# Patient Record
Sex: Male | Born: 1985 | Race: White | Hispanic: No | Marital: Married
Health system: Southern US, Academic
[De-identification: ages and names within clinical notes are randomized; demographics above are authoritative.]

## PROBLEM LIST (undated history)

## (undated) ENCOUNTER — Ambulatory Visit

## (undated) ENCOUNTER — Encounter

## (undated) ENCOUNTER — Encounter
Attending: Student in an Organized Health Care Education/Training Program | Primary: Student in an Organized Health Care Education/Training Program

## (undated) ENCOUNTER — Telehealth

## (undated) ENCOUNTER — Encounter: Attending: Dermatology | Primary: Dermatology

## (undated) ENCOUNTER — Ambulatory Visit: Payer: MEDICAID | Attending: Dermatology | Primary: Dermatology

## (undated) DIAGNOSIS — J45909 Unspecified asthma, uncomplicated: Secondary | ICD-10-CM

## (undated) DIAGNOSIS — G43909 Migraine, unspecified, not intractable, without status migrainosus: Secondary | ICD-10-CM

## (undated) DIAGNOSIS — L732 Hidradenitis suppurativa: Secondary | ICD-10-CM

## (undated) DIAGNOSIS — E785 Hyperlipidemia, unspecified: Secondary | ICD-10-CM

## (undated) DIAGNOSIS — R569 Unspecified convulsions: Secondary | ICD-10-CM

## (undated) DIAGNOSIS — R55 Syncope and collapse: Secondary | ICD-10-CM

## (undated) HISTORY — DX: Migraine, unspecified, not intractable, without status migrainosus: G43.909

## (undated) HISTORY — PX: TONSILLECTOMY: SUR1361

## (undated) HISTORY — DX: Unspecified convulsions: R56.9

## (undated) HISTORY — DX: Hyperlipidemia, unspecified: E78.5

## (undated) HISTORY — PX: KIDNEY SURGERY: SHX687

## (undated) HISTORY — PX: TYMPANOSTOMY TUBE PLACEMENT: SHX32

## (undated) HISTORY — DX: Hidradenitis suppurativa: L73.2

## (undated) HISTORY — PX: HERNIA REPAIR: SHX51

## (undated) HISTORY — DX: Syncope and collapse: R55

---

## 2007-01-07 HISTORY — PX: NEPHRECTOMY LIVING DONOR: SUR877

## 2017-07-10 ENCOUNTER — Encounter: Payer: Self-pay | Admitting: Gynecology

## 2017-07-10 ENCOUNTER — Other Ambulatory Visit: Payer: Self-pay

## 2017-07-10 ENCOUNTER — Ambulatory Visit
Admission: EM | Admit: 2017-07-10 | Discharge: 2017-07-10 | Disposition: A | Discharge status: Home / Self Care | Payer: Self-pay | Attending: Family Medicine | Admitting: Family Medicine

## 2017-07-10 DIAGNOSIS — L0291 Cutaneous abscess, unspecified: Secondary | ICD-10-CM

## 2017-07-10 DIAGNOSIS — H6001 Abscess of right external ear: Secondary | ICD-10-CM

## 2017-07-10 HISTORY — DX: Unspecified asthma, uncomplicated: J45.909

## 2017-07-10 MED ORDER — LIDOCAINE HCL (PF) 1 % IJ SOLN: 5.0000 mL | Freq: Once | INTRAMUSCULAR | Status: DC

## 2017-07-10 MED ORDER — SULFAMETHOXAZOLE-TRIMETHOPRIM 800-160 MG PO TABS: 1.0000 | ORAL_TABLET | Freq: Two times a day (BID) | ORAL | 0 refills | Status: AC

## 2017-07-10 NOTE — ED Provider Notes (Signed)
[facility] ____________________________________________  Time seen: Approximately 5:47 PM  I have reviewed the triage vital signs and the nursing notes.   HISTORY  Chief Complaint No chief complaint on file.   HPI Mario Boyd is a 32 y.o. male presenting for evaluation of skin changes noted beneath right ear.  Patient reports that for at least several weeks he has had a very small bump like area beneath right earlobe, and states that time is nontender and unchanged.  Reports over the last few weeks the area has became red and increased in size as well as tenderness.  Denies any drainage.  States that they had leftover doxycycline at home in which he completed a 7-day course of 100 mg twice daily.  States that the redness has improved but the area is still swollen without drainage.  States that it does cause some pressure to his right ear, but denies other pain.  States pain is worse with direct palpation.  Denies hearing changes, fevers, throat swelling, posterior pain, cough, congestion, previous skin infections, history of MRSA.  No other alleviating measures attempted.  Denies any other aggravating factors reports otherwise feels well.  Reports up-to-date on immunizations including tetanus.  Denies insect bite.   Past Medical History:  Diagnosis Date  . Asthma     There are no active problems to display for this patient.   Past Surgical History:  Procedure Laterality Date  . HERNIA REPAIR    . KIDNEY SURGERY    . TONSILLECTOMY    . TYMPANOSTOMY TUBE PLACEMENT        Current Facility-Administered Medications:  .  lidocaine (PF) (XYLOCAINE) 1 % injection 5 mL, 5 mL, Other, Once, Marylene Land, NP  Current Outpatient Medications:  .  sulfamethoxazole-trimethoprim (BACTRIM DS,SEPTRA DS) 800-160 MG tablet, Take 1 tablet by mouth 2 (two) times daily for 10 days., Disp: 20 tablet, Rfl: 0  Allergies Patient has no known allergies.  History reviewed. No  pertinent family history.  Social History Social History   Tobacco Use  . Smoking status: Current Every Day Smoker    Packs/day: 1.00    Types: Cigarettes  . Smokeless tobacco: Never Used  Substance Use Topics  . Alcohol use: Never    Frequency: Never  . Drug use: Never    Review of Systems Constitutional: No fever/chills Cardiovascular: Denies chest pain. Respiratory: Denies shortness of breath. Gastrointestinal: No abdominal pain.  Musculoskeletal: Negative for back pain. Skin: As above.   ____________________________________________   PHYSICAL EXAM:  VITAL SIGNS: ED Triage Vitals  Enc Vitals Group     BP 07/10/17 1628 (!) 141/101     Pulse Rate 07/10/17 1628 90     Resp 07/10/17 1628 16     Temp 07/10/17 1628 98.5 F (36.9 C)     Temp Source 07/10/17 1628 Oral     SpO2 07/10/17 1628 100 %     Weight 07/10/17 1630 204 lb (92.5 kg)     Height 07/10/17 1630 5\' 9"  (1.753 m)     Head Circumference --      Peak Flow --      Pain Score 07/10/17 1630 3     Pain Loc --      Pain Edu? --      Excl. in [place]? --     Constitutional: Alert and oriented. Well appearing and in no acute distress. Eyes: Conjunctivae are normal.  ENT      Head: Normocephalic and atraumatic.  Ears: Left: Nontender, normal canal, no erythema, normal TM.  Right: Nontender, normal canal, no erythema, normal TM.  No mastoid tenderness bilaterally.  See skin below.      Nose: No congestion/rhinnorhea.      Mouth/Throat: Mucous membranes are moist.Oropharynx non-erythematous. Neck: No stridor. Supple without meningismus.  Hematological/Lymphatic/Immunilogical: No cervical lymphadenopathy. Cardiovascular: Normal rate, regular rhythm. Grossly normal heart sounds.  Good peripheral circulation. Respiratory: Normal respiratory effort without tachypnea nor retractions. Breath sounds are clear and equal bilaterally. No wheezes, rales, rhonchi. Musculoskeletal:  No midline cervical, thoracic or lumbar  tenderness to palpation. Neurologic:  Normal speech and language. Speech is normal. No gait instability.  Skin:  Skin is warm, dry.  Except: Just inferior to right earlobe superficial minimally erythematous approximately 1.5 cm fluctuance, non-pointing, no drainage, no surrounding tenderness, mild tenderness to direct palpation.  Psychiatric: Mood and affect are normal. Speech and behavior are normal. Patient exhibits appropriate insight and judgment   ___________________________________________   LABS (all labs ordered are listed, but only abnormal results are displayed)  Labs Reviewed - No data to display   PROCEDURES Procedures    Procedure(s) performed:  Procedure(s) performed:  Procedure explained and verbal consent obtained. Consent: Verbal consent obtained. Written consent not obtained. Risks and benefits: risks, benefits and alternatives were discussed Patient identity confirmed: verbally with patient and hospital-assigned identification number  Consent given by: patient   I&D abscess Location: right inferior ear Preparation: Patient was prepped and draped in the usual sterile fashion. Anesthesia with 1% Lidocaine 3 mls Irrigation solution: saline and betadine Amount of cleaning: copious  Superficial incision made with #11 blade scalpel Moderate purulent drainage immediately obtained with expression. Sterile forceps used to probe and break up loculations.  1/4 " iodoform gauze used and packed.  Patient tolerate well. Wound well approximated post repair.  dressing applied.  Wound care instructions provided.  Observe for any signs of infection or other problems.      INITIAL IMPRESSION / ASSESSMENT AND PLAN / ED COURSE  Pertinent labs & imaging results that were available during my care of the patient were reviewed by me and considered in my medical decision making (see chart for details).  Well-appearing patient.  Right inferior earlobe superficial abscess noted,  which was fluctuant, drained, patient tolerated well and reports improved discomfort immediately after.  Will place patient on oral Bactrim.  Recommend 2 to 3-day wound check and packing removal.  Discussed keeping clean, warm compresses and supportive care.  Discussed strict follow-up and return parameters.Discussed indication, risks and benefits of medications with patient.  Discussed follow up with Primary care physician this week. Discussed follow up and return parameters including no resolution or any worsening concerns. Patient verbalized understanding and agreed to plan.   ____________________________________________   FINAL CLINICAL IMPRESSION(S) / ED DIAGNOSES  Final diagnoses:  Cutaneous abscess, unspecified site     ED Discharge Orders        Ordered    sulfamethoxazole-trimethoprim (BACTRIM DS,SEPTRA DS) 800-160 MG tablet  2 times daily     07/10/17 1705       Note: This dictation was prepared with Dragon dictation along with smaller phrase technology. Any transcriptional errors that result from this process are unintentional.         Marylene Land, NP 07/10/17 1827

## 2017-07-10 NOTE — ED Triage Notes (Signed)
Per patient cyst at right ear.

## 2017-07-10 NOTE — Discharge Instructions (Addendum)
Take medication as prescribed. Warm compresses. Keep clean.   Return to urgent care in 2 to 3 days for packing removal and wound recheck.  Follow up with your primary care physician this week as needed. Return to Urgent care for new or worsening concerns.

## 2018-01-31 ENCOUNTER — Encounter: Payer: Self-pay | Admitting: Internal Medicine

## 2018-01-31 ENCOUNTER — Emergency Department
Admission: EM | Admit: 2018-01-31 | Discharge: 2018-01-31 | Disposition: A | Discharge status: Home / Self Care | Payer: Medicaid Other | Attending: Emergency Medicine | Admitting: Emergency Medicine

## 2018-01-31 ENCOUNTER — Other Ambulatory Visit: Payer: Self-pay

## 2018-01-31 DIAGNOSIS — J45909 Unspecified asthma, uncomplicated: Secondary | ICD-10-CM | POA: Insufficient documentation

## 2018-01-31 DIAGNOSIS — F17210 Nicotine dependence, cigarettes, uncomplicated: Secondary | ICD-10-CM | POA: Insufficient documentation

## 2018-01-31 DIAGNOSIS — L02811 Cutaneous abscess of head [any part, except face]: Secondary | ICD-10-CM

## 2018-01-31 DIAGNOSIS — R220 Localized swelling, mass and lump, head: Secondary | ICD-10-CM | POA: Diagnosis present

## 2018-01-31 DIAGNOSIS — L03811 Cellulitis of head [any part, except face]: Secondary | ICD-10-CM | POA: Diagnosis not present

## 2018-01-31 MED ORDER — SULFAMETHOXAZOLE-TRIMETHOPRIM 800-160 MG PO TABS: 1.0000 | ORAL_TABLET | Freq: Two times a day (BID) | ORAL | 0 refills | Status: DC

## 2018-01-31 NOTE — ED Provider Notes (Signed)
[facility] Emergency Department Provider Note ____________________________________________  Time seen: 19  I have reviewed the triage vital signs and the nursing notes.  HISTORY  Chief Complaint  Rash   HPI Mario Boyd is a 33 y.o. male presents to the clinic today with c/o multiple abscess on his scalp. He reports this has been going on for years. He reports multiple areas that will intermittently drain pus and blood. He reports the areas are very sensitive to touch, but otherwise, not painful. He denies fever, chills or body aches. He reports his sister has Hidradenitis Suppurative and he reports he thinks he may have the same although it has not been diagnosed. He has not taken anything OTC for this. He has no insurance and has not seen a dermatologist.  Past Medical History:  Diagnosis Date  . Asthma     There are no active problems to display for this patient.   Past Surgical History:  Procedure Laterality Date  . HERNIA REPAIR    . KIDNEY SURGERY    . TONSILLECTOMY    . TYMPANOSTOMY TUBE PLACEMENT      Prior to Admission medications   Medication Sig Start Date End Date Taking? Authorizing Provider  sulfamethoxazole-trimethoprim (BACTRIM DS,SEPTRA DS) 800-160 MG tablet Take 1 tablet by mouth 2 (two) times daily. 01/31/18   Jearld Fenton, NP    Allergies Patient has no known allergies.  History reviewed. No pertinent family history.  Social History Social History   Tobacco Use  . Smoking status: Current Every Day Smoker    Packs/day: 1.00    Types: Cigarettes  . Smokeless tobacco: Never Used  Substance Use Topics  . Alcohol use: Never    Frequency: Never  . Drug use: Never    Review of Systems  Constitutional: Negative for fever, chills or body aches. Cardiovascular: Negative for chest pain. Respiratory: Negative for shortness of breath. Skin: Positive for multiple abscess of scalp.  Neurological: Negative for headaches,  focal weakness or numbness. ____________________________________________  PHYSICAL EXAM:  VITAL SIGNS: ED Triage Vitals  Enc Vitals Group     BP 01/31/18 1929 (!) 144/88     Pulse Rate 01/31/18 1929 78     Resp 01/31/18 1929 16     Temp 01/31/18 1929 98.9 F (37.2 C)     Temp Source 01/31/18 1929 Oral     SpO2 01/31/18 1929 97 %     Weight 01/31/18 1930 210 lb (95.3 kg)     Height 01/31/18 1930 5\' 9"  (1.753 m)     Head Circumference --      Peak Flow --      Pain Score 01/31/18 1932 5     Pain Loc --      Pain Edu? --      Excl. in [place]? --     Constitutional: Alert and oriented. Well appearing and in no distress. Cardiovascular: Normal rate, regular rhythm.  Respiratory: Normal respiratory effort. No wheezes/rales/rhonchi. Neurologic:  Normal speech and language. No gross focal neurologic deficits are appreciated. Skin:  Multiple (10 +) small abscess noted of scalp. None of them are open or draining. 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

## 2018-01-31 NOTE — ED Triage Notes (Signed)
Patient reports rash/sores on his head and other body parts.  States they have been there for "awhile" and thinks its HS but hasn't been to a dermatologist.

## 2018-01-31 NOTE — Discharge Instructions (Signed)
You have been diagnosed with multiple abscess of the scalp. There is nothing that can be drained at this time. I am giving you a RX for Septra DS to take 2 x day for 14 days. Follow up with dermatology as an outpatient.

## 2018-04-21 ENCOUNTER — Telehealth: Payer: Self-pay | Admitting: Nurse Practitioner

## 2018-04-21 ENCOUNTER — Ambulatory Visit: Payer: Self-pay | Admitting: *Deleted

## 2018-04-21 DIAGNOSIS — J111 Influenza due to unidentified influenza virus with other respiratory manifestations: Secondary | ICD-10-CM

## 2018-04-21 NOTE — Progress Notes (Signed)

## 2018-04-21 NOTE — Telephone Encounter (Signed)
   Reason for Disposition . MILD difficulty breathing (e.g., minimal/no SOB at rest, SOB with walking, pulse <100)  Answer Assessment - Initial Assessment Questions 1. COVID-19 DIAGNOSIS: "Who made your Coronavirus (COVID-19) diagnosis?" "Was it confirmed by a positive lab test?" If not diagnosed by a HCP, ask "Are there lots of cases (community spread) where you live?" (See public health department website, if unsure)   * MAJOR community spread: high number of cases; numbers of cases are increasing; many people hospitalized.   * MINOR community spread: low number of cases; not increasing; few or no people hospitalized     Community spread- no known contact 2. ONSET: "When did the COVID-19 symptoms start?"      yesterday 3. WORST SYMPTOM: "What is your worst symptom?" (e.g., cough, fever, shortness of breath, muscle aches)     Throat pain 4. COUGH: "How bad is the cough?"       Dry cough- comes and goes 5. FEVER: "Do you have a fever?" If so, ask: "What is your temperature, how was it measured, and when did it start?"     Did have fever 101.3- now 99.6- ear thermometer 6. RESPIRATORY STATUS: "Describe your breathing?" (e.g., shortness of breath, wheezing, unable to speak)      Chest tightness- constant- worse with exertion 7. BETTER-SAME-WORSE: "Are you getting better, staying the same or getting worse compared to yesterday?"  If getting worse, ask, "In what way?"     Getting worse- whole body is hurting, tightness in chest, fever is worse 8. HIGH RISK DISEASE: "Do you have any chronic medical problems?" (e.g., asthma, heart or lung disease, weak immune system, etc.)     HS- cyst, childhood asthma 9. PREGNANCY: "Is there any chance you are pregnant?" "When was your last menstrual period?"     n/a 10. OTHER SYMPTOMS: "Do you have any other symptoms?"  (e.g., runny nose, headache, sore throat, loss of smell)       Headache, sore throat  Protocols used: CORONAVIRUS (COVID-19) DIAGNOSED OR  SUSPECTED-A-AH

## 2018-04-26 ENCOUNTER — Ambulatory Visit
Admission: EM | Admit: 2018-04-26 | Discharge: 2018-04-26 | Disposition: A | Discharge status: Home / Self Care | Payer: Self-pay | Attending: Family Medicine | Admitting: Family Medicine

## 2018-04-26 ENCOUNTER — Other Ambulatory Visit: Payer: Self-pay

## 2018-04-26 DIAGNOSIS — B9689 Other specified bacterial agents as the cause of diseases classified elsewhere: Secondary | ICD-10-CM

## 2018-04-26 DIAGNOSIS — L02811 Cutaneous abscess of head [any part, except face]: Secondary | ICD-10-CM

## 2018-04-26 MED ORDER — DOXYCYCLINE HYCLATE 100 MG PO CAPS: 100.0000 mg | ORAL_CAPSULE | Freq: Two times a day (BID) | ORAL | 0 refills | Status: DC

## 2018-04-26 MED ORDER — PREDNISONE 10 MG PO TABS: ORAL_TABLET | ORAL | 0 refills | Status: DC

## 2018-04-26 NOTE — Discharge Instructions (Addendum)
Take medication as prescribed. Rest. Drink plenty of fluids. Clean scalp with chlorhexidine-Chlorhexidine can be used for showering, beginning with once per week use and increasing up to once daily as tolerated.   Please follow up with specialty dermatology as discussed.  See [facility] information given.  Follow up with your primary care physician this week as needed. Return to Urgent care for new or worsening concerns.

## 2018-04-26 NOTE — ED Provider Notes (Signed)
[facility] ____________________________________________  Time seen: Approximately 2:34 PM  I have reviewed the triage vital signs and the nursing notes.   HISTORY  Chief Complaint Cyst   HPI Mario Boyd is a 33 y.o. male presenting for evaluation of multiple abscess areas to his scalp.  Patient reports this is been an ongoing issue.  States for the last several months these areas have been present and intermittently flareup more so than other times.  Was last on Bactrim in January, which he states it started to help some but never fully resolved.  States using topical antibiotic ointments have not helped.  Denies other aggravating alleviating factors.  States the areas are tender and intermittently drain.  Sister with history of hidradenitis suppurativa.  Denies any contact changes.  Denies fevers, cough, congestion, recent sickness or other recent antibiotic use.  Reports otherwise doing well. Denies other skin changes elsewhere.     Past Medical History:  Diagnosis Date  . Asthma     There are no active problems to display for this patient.   Past Surgical History:  Procedure Laterality Date  . HERNIA REPAIR    . KIDNEY SURGERY    . TONSILLECTOMY    . TYMPANOSTOMY TUBE PLACEMENT       No current facility-administered medications for this encounter.   Current Outpatient Medications:  .  FLUoxetine (PROZAC) 40 MG capsule, Take 40 mg by mouth daily., Disp: , Rfl:  .  doxycycline (VIBRAMYCIN) 100 MG capsule, Take 1 capsule (100 mg total) by mouth 2 (two) times daily., Disp: 20 capsule, Rfl: 0 .  predniSONE (DELTASONE) 10 MG tablet, Start 60 mg po day one, then 50 mg po day two, taper by 10 mg daily until complete., Disp: 21 tablet, Rfl: 0  Allergies Patient has no known allergies.  History reviewed. No pertinent family history.  Social History Social History   Tobacco Use  . Smoking status: Current Every Day Smoker    Packs/day: 0.50    Types:  Cigarettes  . Smokeless tobacco: Never Used  Substance Use Topics  . Alcohol use: Never    Frequency: Never  . Drug use: Never    Review of Systems Constitutional: No fever Cardiovascular: Denies chest pain. Respiratory: Denies shortness of breath. Gastrointestinal: No abdominal pain.  Musculoskeletal: Negative for back pain. Skin: As above.   ____________________________________________   PHYSICAL EXAM:  VITAL SIGNS: ED Triage Vitals  Enc Vitals Group     BP 04/26/18 1351 (!) 146/99     Pulse Rate 04/26/18 1351 85     Resp 04/26/18 1351 16     Temp 04/26/18 1351 98.8 F (37.1 C)     Temp Source 04/26/18 1351 Oral     SpO2 04/26/18 1351 100 %     Weight 04/26/18 1352 200 lb (90.7 kg)     Height 04/26/18 1352 5\' 9"  (1.753 m)     Head Circumference --      Peak Flow --      Pain Score 04/26/18 1351 7     Pain Loc --      Pain Edu? --      Excl. in [place]? --     Constitutional: Alert and oriented. Well appearing and in no acute distress. ENT      Head: Normocephalic. Hematological/Lymphatic/Immunilogical: No cervical lymphadenopathy. Cardiovascular: Normal rate, regular rhythm. Grossly normal heart sounds.  Good peripheral circulation. Respiratory: Normal respiratory effort without tachypnea nor retractions. Breath sounds are clear and equal bilaterally.  No wheezes, rales, rhonchi. Musculoskeletal: Steady gait.  Neurologic:  Normal speech and language. Speech is normal. No gait instability.  Skin:  Skin is warm, dry. Except: Numerous small abscesses noted on the scalp throughout varying from approximately 1.5 cm to smaller, some crusting, no active drainage, mild scattered erythema. psychiatric: Mood and affect are normal. Speech and behavior are normal. Patient exhibits appropriate insight and judgment   ___________________________________________   LABS (all labs ordered are listed, but only abnormal results are displayed)  Labs Reviewed - No data to display    PROCEDURES Procedures   INITIAL IMPRESSION / [place] / ED COURSE  Pertinent labs & imaging results that were available during my care of the patient were reviewed by me and considered in my medical decision making (see chart for details).  Well-appearing patient.  No acute distress.  Patient with recurrent episodes of numerous abscesses to scalp, present with the same, concern for hidradenitis suppurativa.  Will treat with doxycycline and prednisone.  Recommend chlorhexidine cleansing.  Also further recommend follow-up with dermatology for specially clinics. Discussed indication, risks and benefits of medications with patient.  Discussed follow up with Primary care physician this week. Discussed follow up and return parameters including no resolution or any worsening concerns. Patient verbalized understanding and agreed to plan.   ____________________________________________   FINAL CLINICAL IMPRESSION(S) / ED DIAGNOSES  Final diagnoses:  Scalp abscess     ED Discharge Orders         Ordered    doxycycline (VIBRAMYCIN) 100 MG capsule  2 times daily     04/26/18 1419    predniSONE (DELTASONE) 10 MG tablet     04/26/18 1419           Note: This dictation was prepared with Dragon dictation along with smaller phrase technology. Any transcriptional errors that result from this process are unintentional.         Marylene Land, NP 04/26/18 1442

## 2018-04-26 NOTE — ED Triage Notes (Signed)
Patient states that he has multiple cysts all over his head, causing him to lose his hair.

## 2018-07-22 ENCOUNTER — Other Ambulatory Visit: Payer: Self-pay

## 2018-07-22 ENCOUNTER — Ambulatory Visit
Admission: EM | Admit: 2018-07-22 | Discharge: 2018-07-22 | Disposition: A | Discharge status: Home / Self Care | Payer: Self-pay | Attending: Urgent Care | Admitting: Urgent Care

## 2018-07-22 ENCOUNTER — Encounter: Payer: Self-pay | Admitting: Emergency Medicine

## 2018-07-22 DIAGNOSIS — L732 Hidradenitis suppurativa: Secondary | ICD-10-CM

## 2018-07-22 MED ORDER — DOXYCYCLINE HYCLATE 100 MG PO CAPS: 100.0000 mg | ORAL_CAPSULE | Freq: Two times a day (BID) | ORAL | 0 refills | Status: AC

## 2018-07-22 MED ORDER — PREDNISONE 10 MG (21) PO TBPK: ORAL_TABLET | Freq: Every day | ORAL | 0 refills | Status: DC

## 2018-07-22 NOTE — ED Triage Notes (Signed)
Pt c/o abscess of his scalp. He states that it has been going for over a year but recently got worse about 2 months ago. He states this is due to HS.

## 2018-07-22 NOTE — Discharge Instructions (Addendum)
It was very nice seeing you today in clinic. Thank you for entrusting me with your care.   Please utilize the medications that we discussed. Your prescriptions have been called in to your pharmacy. Continue to use chlorhexidine   Make arrangements to follow up with your regular doctor in 1 week for re-evaluation if not improving. If your symptoms/condition worsens, please seek follow up care either here or in the ER. Please remember, our [place] providers are "right here with you" when you need [place].   Again, it was my pleasure to take care of you today. Thank you for choosing our clinic. I hope that you start to feel better quickly.   Honor Loh, MSN, APRN, FNP-C, CEN Advanced Practice Provider [facility]

## 2018-07-22 NOTE — ED Provider Notes (Addendum)
[place], [place]   Name: Mario Boyd DOB: [birth year] MRN: [id number] CSN: [id number] PCP: Patient, No Pcp Per  Arrival date and time:  07/22/18 1334  Chief Complaint:  Abscess (scalp)   NOTE: Prior to seeing the patient today, I have reviewed the triage nursing documentation and vital signs. Clinical staff has updated patient's PMH/PSHx, current medication list, and drug allergies/intolerances to ensure comprehensive history available to assist in medical decision making.   History:   HPI: Mario Boyd is a 33 y.o. male who presents today with complaints of a recurrence of his familial hidradenitis suppurativa. Patient has significant disease to his scalp Charlestine Massed stage III). Sister has the same condition, however her lesions are mainly in her axillae and groin. Sister being followed by specialist at [facility] for her condition. Patient has been advised of need to see dermatology for more definitive treatment of his condition, however due to financial concerns, he has been unable to be seen to date. Patient does note that he recently learned that the same clinic that his sister is being treated in at [facility] has a patient financial assistance program. He states, "I found out they take payments. I am in the process of trying to get in to be seen".   In review of available medical records, it appears as if patient is being seen about every 3 months for flares of his chronic skin condition. He notes that he was last treated with a course of doxycycline and prednisone (04/26/2018), which "almost cleared him up". However, he advises that once the medication courses ended his symptoms returned. He has also been treated with SMZ-TMP in the past, however feels as if the doxycycline is more effective. Patient states, "I deal with it until I can't anymore before I come in".   Patient presents with diffusely distributed (multiple) abcesses to scalp. Areas are painful and have been draining sanguinopurulent exudate.  There is evidence of sinus tracking and significant scarring to his entire scalp that has resulted in patchy hair growth. Patient denies any associated systemic symptoms of infection; no fevers, chills, or myalgias. He continues to use the recommended chlorhexidine gluconate (CHG) solution on the areas every other day. Over the course of the last few days, lesions have declared on the posterior aspect of his neck, within his facial hair, and on his anterior chest wall. He has used topical antibiotic ointments in the past, those of which have proven to be ineffective in improving his symptoms during flares. Patient cites the descending spread of his lesions, and overall frustration, as reasons for being seen today. He states, "this is ridiculous. I can't even shave my head and this looks so bad".   Past Medical History:  Diagnosis Date  . Asthma     Past Surgical History:  Procedure Laterality Date  . HERNIA REPAIR    . KIDNEY SURGERY    . TONSILLECTOMY    . TYMPANOSTOMY TUBE PLACEMENT      History reviewed. No pertinent family history.  Social History   Tobacco Use  . Smoking status: Former Smoker    Packs/day: 0.50    Types: Cigarettes  . Smokeless tobacco: Never Used  Substance Use Topics  . Alcohol use: Never    Frequency: Never  . Drug use: Never    There are no active problems to display for this patient.   Home Medications:    No outpatient medications have been marked as taking for the 07/22/18 encounter [facility] Encounter).    Allergies:  Patient has no known allergies.  Review of Systems (ROS): Review of Systems  Constitutional: Negative for chills and fever.  Respiratory: Negative for cough and shortness of breath.   Cardiovascular: Negative for chest pain and palpitations.  Skin: Positive for color change and wound.  Hematological: Negative for adenopathy.     Vital Signs: Today's Vitals   07/22/18 1347 07/22/18 1350  BP:  (!) 132/99  Pulse:  85   Resp:  18  Temp:  98.1 F (36.7 C)  TempSrc:  Oral  SpO2:  100%  Weight: 200 lb (90.7 kg)   Height: 5\' 9"  (1.753 m)   PainSc: 5      Physical Exam: Physical Exam  Constitutional: He is oriented to person, place, and time and well-developed, well-nourished, and in no distress.  HENT:  Head: Normocephalic and atraumatic. Hair is abnormal (patchy 2/2 chronic skin condition).  Mouth/Throat: Oropharynx is clear and moist and mucous membranes are normal.  Eyes: Pupils are equal, round, and reactive to light. EOM are normal.  Neck: Normal range of motion. Neck supple. No tracheal deviation present.  Cardiovascular: Normal rate, regular rhythm, normal heart sounds and intact distal pulses. Exam reveals no gallop and no friction rub.  No murmur heard. Pulmonary/Chest: Effort normal and breath sounds normal. No respiratory distress. He has no wheezes. He has no rales.  Lymphadenopathy:    He has no cervical adenopathy.  Neurological: He is alert and oriented to person, place, and time. Gait normal. GCS score is 15.  Skin: Skin is warm and dry. Lesion noted. No rash noted. There is erythema.  Multiple skin abscesses noted to scalp, posterior neck, within facial hair, and to anterior chest wall. Significant sinus tracking to scalp; areas draining sanguinopurulent material. (+) significant scarring resulting in patchy hair growth.   Psychiatric: Mood, memory, affect and judgment normal.  Nursing note and vitals reviewed.   Urgent Care Treatments / Results:   LABS: PLEASE NOTE: all labs that were ordered this encounter are listed, however only abnormal results are displayed. Labs Reviewed - No data to display  EKG: -None  RADIOLOGY: No results found.  PROCEDURES: Procedures  MEDICATIONS RECEIVED THIS VISIT: Medications - No data to display  PERTINENT CLINICAL COURSE NOTES/UPDATES:   Initial Impression / Assessment and Plan / Urgent Care Course:  Pertinent labs & imaging results  that were available during my care of the patient were personally reviewed by me and considered in my medical decision making (see lab/imaging section of note for values and interpretations).  Sabin Gibeault is a 33 y.o. male who presents to [facility] today with complaints of hidradenitis suppurativa flare.   Patient is well appearing overall in clinic today. He does not appear to be in any acute distress. Presenting symptoms (see HPI) and exam as documented above. Patient with significant Hurley stage III disease. He suffers flares about every 2-3 months. Patient last treated back on 04/26/2018, however he notes that the antibiotics did not abate the flare. He is asking for antibiotic course to be extended this time . He continues to use the recommended CHG every other day.  He has had limited access to care due to financial concerns, however he notes plans are to be seen in speciality dermatology clinic at [facility] soon. Will treat current disease flare with 14 day course of doxycycline, in addition to a 6 day prednisone taper. May use Tylenol and/or Ibuprofen as needed for pain. Warm compresses may help promote drainage of scalp  abscesses. Patient to use TAO to neck and chest lesions to prevent infection and spread. He is encouraged to return a call to the clinic for continued evidence of acute flare with the completion of the prescribed interventions.   Discussed follow up with primary care physician in 1 week for re-evaluation. I have reviewed the follow up and strict return precautions for any new or worsening symptoms. Patient is aware of symptoms that would be deemed urgent/emergent, and would thus require further evaluation either here or in the emergency department. At the time of discharge, he verbalized understanding and consent with the discharge plan as it was reviewed with him. All questions were fielded by provider and/or clinic staff prior to patient discharge.    Final Clinical  Impressions / Urgent Care Diagnoses:   Final diagnoses:  Familial hidradenitis suppurativa type 3    New Prescriptions:  [place] Controlled Substance Registry consulted? Not Applicable  Meds ordered this encounter  Medications  . doxycycline (VIBRAMYCIN) 100 MG capsule    Sig: Take 1 capsule (100 mg total) by mouth 2 (two) times daily for 14 days.    Dispense:  28 capsule    Refill:  0  . predniSONE (STERAPRED UNI-PAK 21 TAB) 10 MG (21) TBPK tablet    Sig: Take by mouth daily. Take by mouth: 60 mg x 1 day, 50 mg x 1 day, 40 mg x 1 day, 30 mg x 1 day, 20 mg x 1 day, 10 mg x 1 day    Dispense:  21 tablet    Refill:  0    Recommended Follow up Care:  Patient encouraged to follow up with the following provider within the specified time frame, or sooner as dictated by the severity of his symptoms. As always, he was instructed that for any urgent/emergent care needs, he should seek care either here or in the emergency department for more immediate evaluation.  Follow-up Information    PCP In 1 week.   Why: General reassessment of symptoms if not improving        NOTE: This note was prepared using Lobbyist along with smaller Company secretary. Despite my best ability to proofread, there is the potential that transcriptional errors may still occur from this process, and are completely unintentional.     Karen Kitchens, NP 07/22/18 1715

## 2018-08-28 DIAGNOSIS — R509 Fever, unspecified: Principal | ICD-10-CM

## 2018-08-28 DIAGNOSIS — Z20828 Contact with and (suspected) exposure to other viral communicable diseases: Secondary | ICD-10-CM | POA: Diagnosis not present

## 2018-08-28 DIAGNOSIS — R61 Generalized hyperhidrosis: Secondary | ICD-10-CM | POA: Diagnosis not present

## 2018-08-28 DIAGNOSIS — L732 Hidradenitis suppurativa: Secondary | ICD-10-CM | POA: Diagnosis not present

## 2018-08-29 ENCOUNTER — Ambulatory Visit: Admit: 2018-08-29 | Discharge: 2018-08-29 | Disposition: A | Discharge status: Home / Self Care | Payer: MEDICAID

## 2018-08-29 ENCOUNTER — Emergency Department: Admit: 2018-08-29 | Discharge: 2018-08-29 | Disposition: A | Discharge status: Home / Self Care | Payer: MEDICAID

## 2018-09-03 ENCOUNTER — Ambulatory Visit: Admit: 2018-09-03 | Discharge: 2018-09-04 | Payer: MEDICAID | Attending: Dermatology | Primary: Dermatology

## 2018-09-03 DIAGNOSIS — L700 Acne vulgaris: Secondary | ICD-10-CM

## 2018-09-03 DIAGNOSIS — L663 Perifolliculitis capitis abscedens: Principal | ICD-10-CM

## 2018-09-03 DIAGNOSIS — Z79899 Other long term (current) drug therapy: Secondary | ICD-10-CM

## 2018-09-03 MED ORDER — ISOTRETINOIN 40 MG CAPSULE
ORAL_CAPSULE | Freq: Every day | ORAL | 0 refills | 30.00000 days | Status: CP
Start: 2018-09-03 — End: 2018-09-03

## 2018-09-03 MED ORDER — PREDNISONE 10 MG TABLET
ORAL_TABLET | ORAL | 0 refills | 0.00000 days | Status: CP
Start: 2018-09-03 — End: ?

## 2018-09-03 MED ORDER — ISOTRETINOIN 40 MG CAPSULE: 40 mg | capsule | Freq: Every day | 0 refills | 30 days | Status: AC

## 2018-09-06 DIAGNOSIS — Z79899 Other long term (current) drug therapy: Secondary | ICD-10-CM | POA: Diagnosis not present

## 2018-09-06 DIAGNOSIS — L663 Perifolliculitis capitis abscedens: Secondary | ICD-10-CM | POA: Diagnosis not present

## 2018-09-06 LAB — CBC AND DIFFERENTIAL
HCT: 44 (ref 41–53)
Hemoglobin: 15 (ref 13.5–17.5)
Platelets: 602 — AB (ref 150–399)
WBC: 13.8

## 2018-09-06 LAB — BASIC METABOLIC PANEL
BUN: 13 (ref 4–21)
Creatinine: 1 (ref 0.6–1.3)
Glucose: 78
Potassium: 4.6 (ref 3.4–5.3)
Sodium: 138 (ref 137–147)

## 2018-09-06 LAB — LIPID PANEL
Cholesterol: 249 — AB (ref 0–200)
Triglycerides: 613 — AB (ref 40–160)

## 2018-09-06 LAB — HEPATIC FUNCTION PANEL
ALT: 59 — AB (ref 10–40)
AST: 31 (ref 14–40)
Alkaline Phosphatase: 155 — AB (ref 25–125)
Bilirubin, Total: 0.6

## 2018-09-06 LAB — HEMOGLOBIN A1C: Hemoglobin A1C: 5.5

## 2018-09-08 DIAGNOSIS — L663 Perifolliculitis capitis abscedens: Secondary | ICD-10-CM

## 2018-09-08 MED ORDER — SULFAMETHOXAZOLE 800 MG-TRIMETHOPRIM 160 MG TABLET
ORAL_TABLET | Freq: Two times a day (BID) | ORAL | 0 refills | 30.00000 days | Status: CP
Start: 2018-09-08 — End: 2018-10-01

## 2018-09-17 ENCOUNTER — Ambulatory Visit: Payer: Medicaid Other | Admitting: Family Medicine

## 2018-09-17 ENCOUNTER — Other Ambulatory Visit: Payer: Self-pay

## 2018-09-17 ENCOUNTER — Encounter: Payer: Self-pay | Admitting: Family Medicine

## 2018-09-17 VITALS — BP 122/74 | HR 84 | Temp 98.3°F | Resp 14 | Ht 69.0 in | Wt 213.5 lb

## 2018-09-17 DIAGNOSIS — E669 Obesity, unspecified: Secondary | ICD-10-CM | POA: Diagnosis not present

## 2018-09-17 DIAGNOSIS — Z6831 Body mass index (BMI) 31.0-31.9, adult: Secondary | ICD-10-CM

## 2018-09-17 DIAGNOSIS — D473 Essential (hemorrhagic) thrombocythemia: Secondary | ICD-10-CM | POA: Insufficient documentation

## 2018-09-17 DIAGNOSIS — F17200 Nicotine dependence, unspecified, uncomplicated: Secondary | ICD-10-CM

## 2018-09-17 DIAGNOSIS — Z23 Encounter for immunization: Secondary | ICD-10-CM

## 2018-09-17 DIAGNOSIS — D75839 Thrombocytosis, unspecified: Secondary | ICD-10-CM

## 2018-09-17 DIAGNOSIS — R945 Abnormal results of liver function studies: Secondary | ICD-10-CM

## 2018-09-17 DIAGNOSIS — R7989 Other specified abnormal findings of blood chemistry: Secondary | ICD-10-CM

## 2018-09-17 DIAGNOSIS — E781 Pure hyperglyceridemia: Secondary | ICD-10-CM | POA: Diagnosis not present

## 2018-09-17 DIAGNOSIS — Z905 Acquired absence of kidney: Secondary | ICD-10-CM

## 2018-09-17 DIAGNOSIS — L732 Hidradenitis suppurativa: Secondary | ICD-10-CM

## 2018-09-17 DIAGNOSIS — Z7689 Persons encountering health services in other specified circumstances: Secondary | ICD-10-CM

## 2018-09-17 DIAGNOSIS — Z6832 Body mass index (BMI) 32.0-32.9, adult: Secondary | ICD-10-CM | POA: Insufficient documentation

## 2018-09-17 NOTE — Progress Notes (Signed)
Name: Mario Boyd   MRN: [id number]    DOB: 1985-11-16   Date:09/17/2018       Progress Note  Chief Complaint  Patient presents with  . Establish Care  . Hyperlipidemia    went to derm and they were going to start him on med and ran labs and chol/trig was very high     Subjective:   Mario Boyd is a 33 y.o. male, presents to clinic for routine follow up on the conditions listed above.  HS Seeing dermatology - [facility] Started a few years ago and over last year became severe with cysts to head hair, face, beard chest  Plan was to start accutane but labs were abnormal.  He currently is holding accutane, steroids and is taking antibiotics.  Hypertriglyceridemia Says family hx the same, he has never been on meds to tx triglycerides but father is on crestor Lab Results  Component Value Date   CHOL 249 (A) 09/06/2018   TRIG 613 (A) 09/06/2018  Pt has never had pancreatitis or any liver problems that he knows of.  History of high platelets Says hes had it in the past but doesn't know why. He denies every seeing hematology.  He denies bleeding or clotting issues Lab Results  Component Value Date   PLT 602 (A) 09/06/2018    Saw Dr. Carolynn Comment in [place], moved about a year ago - will sign for records today  Sister with HS is trying gluten free diet - says its helping, there is no known celiac disease, autoimmune disease in family  Former cigarette smoker quit 3 months ago, 18 years 1ppd Currently vapes nicotine and flavors Hx of bronchitis at least once a year when he gets ill - he usually doesn't do anything until it "turns into pneumonia"  No inhalers or OTC meds. 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  Some LFTs were elevated with dermatology labs -  Lab Results  Component Value Date   ALT 59 (A) 09/06/2018   AST 31 09/06/2018   ALKPHOS 155 (A) 09/06/2018   He does not drink alcohol, does not take tylenol ever.  No use of any other medication, supplements, or herbs other than what is in chart. He also denies any biliary colic symptoms he sometimes has little acid reflux depending what foods he eats but he did not had pain with eating denies any abdominal pain, indigestion, bloating, nausea, vomiting, diarrhea, constipation.  He also denies any daily cough, wheeze, shortness of breath, chest pain, palpitations, lower extremity edema, orthopnea, PND, snoring or sleep apnea problems, unintentional weight changes.  He has med assistance right now. He is married, sexually active with only his wife for the past 9 years he does not want to do any STD testing right now    Patient Active Problem List   Diagnosis Date Noted  . Class 1 obesity with serious comorbidity and body mass index (BMI) of 31.0 to 31.9 in adult 09/17/2018  . Elevated LFTs 09/17/2018  . Hypertriglyceridemia 09/17/2018  . History of nephrectomy, right 09/17/2018  . Hidradenitis suppurativa 09/17/2018  . Thrombocytosis (  [place]) 09/17/2018    Past Surgical History:  Procedure Laterality Date  . HERNIA REPAIR    . KIDNEY SURGERY    . NEPHRECTOMY LIVING DONOR Right 2009  . TONSILLECTOMY    . TYMPANOSTOMY TUBE PLACEMENT      Family History  Problem Relation Age of Onset  . Hyperlipidemia Mother   . Hypertension Mother   . Hyperlipidemia Father   . Hypertension Father   . Epilepsy Sister   . Hypertension Sister   . Epilepsy Son   . Heart failure Maternal Grandmother   . Diabetes Maternal Grandfather   . Heart failure Maternal Grandfather   . Diabetes Paternal Grandmother   . Hypertension Paternal Grandfather   . Prostate cancer  Paternal Grandfather     Social History   Socioeconomic History  . Marital status: Married    Spouse name: alice  . Number of children: 2  . Years of education: 54  . Highest education level: Associate degree: occupational, Hotel manager, or vocational program  Occupational History  . Occupation: unemployed  Social Needs  . Financial resource strain: Not hard at all  . Food insecurity    Worry: Never true    Inability: Never true  . Transportation needs    Medical: No    Non-medical: No  Tobacco Use  . Smoking status: Former Smoker    Packs/day: 0.50    Types: Cigarettes  . Smokeless tobacco: Never Used  Substance and Sexual Activity  . Alcohol use: Never    Frequency: Never  . Drug use: Never  . Sexual activity: Yes    Comment: wife on birth control, 9 years  Lifestyle  . Physical activity    Days per week: 0 days    Minutes per session: 0 min  . Stress: Not at all  Relationships  . Social Herbalist on phone: Once a week    Gets together: Once a week    Attends religious service: Never    Active member of club or organization: No    Attends meetings of clubs or organizations: Never    Relationship status: Married  . Intimate partner violence    Fear of current or ex partner: No    Emotionally abused: No    Physically abused: No    Forced sexual activity: No  Other Topics Concern  . Not on file  Social History Narrative  . Not on file     Current Outpatient Medications:  .  sulfamethoxazole-trimethoprim (BACTRIM DS) 800-160 MG tablet, Take by mouth., Disp: , Rfl:  .  predniSONE (STERAPRED UNI-PAK 21 TAB) 10 MG (21) TBPK tablet, Take by mouth daily. Take by mouth: 60 mg x 1 day, 50 mg x 1 day, 40 mg x 1 day, 30 mg x 1 day, 20 mg x 1 day, 10 mg x 1 day (Patient not taking: Reported on 09/17/2018), Disp: 21 tablet, Rfl: 0  No Known Allergies  I personally reviewed active problem list, medication list, allergies, family history, social history, health  maintenance, notes from last encounter, lab results with the patient/caregiver today.  Review of Systems  Constitutional: Negative.   HENT: Negative.   Eyes: Negative.   Respiratory: Negative.   Cardiovascular: Negative.   Gastrointestinal: Negative.   Endocrine: Negative.   Genitourinary: Negative.   Musculoskeletal: Negative.   Skin: Negative.   Allergic/Immunologic: Negative.   Neurological: Negative.   Hematological: Negative.   Psychiatric/Behavioral: Negative.   All other systems reviewed and are negative.  Objective:    Vitals:   09/17/18 1025  BP: 122/74  Pulse: 84  Resp: 14  Temp: 98.3 F (36.8 C)  SpO2: 96%  Weight: 213 lb 8 oz (96.8 kg)  Height: 5\' 9"  (1.753 m)    Body mass index is 31.53 kg/m.  Physical Exam Vitals signs and nursing note reviewed.  Constitutional:      General: He is not in acute distress.    Appearance: Normal appearance. He is well-developed. He is obese. He is not ill-appearing, toxic-appearing or diaphoretic.     Interventions: Face mask in place.  HENT:     Head: Normocephalic and atraumatic.     Jaw: No trismus.     Right Ear: External ear normal.     Left Ear: External ear normal.  Eyes:     General: Lids are normal. No scleral icterus.    Conjunctiva/sclera: Conjunctivae normal.     Pupils: Pupils are equal, round, and reactive to light.  Neck:     Musculoskeletal: Normal range of motion and neck supple.     Trachea: Trachea and phonation normal. No tracheal deviation.  Cardiovascular:     Rate and Rhythm: Normal rate and regular rhythm.     Pulses: Normal pulses.          Radial pulses are 2+ on the right side and 2+ on the left side.       Posterior tibial pulses are 2+ on the right side and 2+ on the left side.     Heart sounds: Normal heart sounds. No murmur. No friction rub. No gallop.   Pulmonary:     Effort: Pulmonary effort is normal. No respiratory distress.     Breath sounds: Normal breath sounds. No  stridor. No wheezing, rhonchi or rales.  Abdominal:     General: Bowel sounds are normal. There is no distension.     Palpations: Abdomen is soft.     Tenderness: There is no abdominal tenderness. There is no guarding or rebound.  Musculoskeletal: Normal range of motion.     Right lower leg: No edema.     Left lower leg: No edema.  Skin:    General: Skin is warm and dry.     Capillary Refill: Capillary refill takes less than 2 seconds.     Coloration: Skin is not jaundiced.     Findings: No rash.     Nails: There is no clubbing.      Comments: Visible erythematous lesions to head and scalp, pt covering most of them up with ballcap and mask today  Neurological:     Mental Status: He is alert.     Cranial Nerves: No dysarthria or facial asymmetry.     Motor: No tremor or abnormal muscle tone.     Gait: Gait normal.  Psychiatric:        Mood and Affect: Mood normal.        Speech: Speech normal.        Behavior: Behavior normal. Behavior is cooperative.      Recent Results (from the past 2160 hour(s))  CBC and differential     Status: Abnormal   Collection Time: 09/06/18 12:00 AM  Result Value Ref Range   Hemoglobin 15.0 13.5 - 17.5   HCT 44 41 - 53   Platelets 602 (A) 150 - 399   WBC [id number]   Basic metabolic panel     Status: None   Collection Time: 09/06/18 12:00 AM  Result  Value Ref Range   Glucose 78    BUN 13 4 - 21   Creatinine 1.0 0.6 - 1.3   Potassium 4.6 3.4 - 5.3   Sodium 138 137 - 147  Lipid panel     Status: Abnormal   Collection Time: 09/06/18 12:00 AM  Result Value Ref Range   Triglycerides 613 (A) 40 - 160   Cholesterol 249 (A) 0 - 200  Hepatic function panel     Status: Abnormal   Collection Time: 09/06/18 12:00 AM  Result Value Ref Range   Alkaline Phosphatase 155 (A) 25 - 125   ALT 59 (A) 10 - 40   AST 31 14 - 40   Bilirubin, Total 0.6   Hemoglobin A1c     Status: None   Collection Time: 09/06/18 12:00 AM  Result Value Ref Range   Hemoglobin A1C  5.5     PHQ2/9: Depression screen PHQ 2/9 09/17/2018  Decreased Interest 0  Down, Depressed, Hopeless 0  PHQ - 2 Score 0  Altered sleeping 0  Tired, decreased energy 0  Change in appetite 0  Feeling bad or failure about yourself  0  Trouble concentrating 0  Moving slowly or fidgety/restless 0  Suicidal thoughts 0  PHQ-9 Score 0  Difficult doing work/chores Not difficult at all    phq 9 is negative - reviewed today  Fall Risk: Fall Risk  09/17/2018  Falls in the past year? 0  Number falls in past yr: 0  Injury with Fall? 0   Functional Status Survey: Is the patient deaf or have difficulty hearing?: No Does the patient have difficulty seeing, even when wearing glasses/contacts?: Yes Does the patient have difficulty concentrating, remembering, or making decisions?: No Does the patient have difficulty walking or climbing stairs?: No Does the patient have difficulty dressing or bathing?: No Does the patient have difficulty doing errands alone such as visiting a doctor's office or shopping?: No   Assessment & Plan:    Pt is a 33 year old white male he presents here today to establish care, moved to the area about 1 year ago has had a few ER visits, and recently established with dermatology at [facility], there had multiple abnormal labs and he was told to seek a PCP. Patient reports having no PCP for the past 4 years although he does have a fair amount of medical history and did have the name of his doctor in [place] and agrees to request records today.  He did defer some of his care gaps such as HIV testing updating tetanus etc., and would prefer to wait for his records.     ICD-10-CM   1. Hypertriglyceridemia  E78.1 Amylase    Lipase    COMPLETE METABOLIC PANEL WITH GFR    Lipid panel   low glycemic index/diet reviewed, will recheck labs, will want tx to be compatible with accutane if able  2. Thrombocytosis (HCC)  D47.3 CBC (INCLUDES DIFF/PLT) WITH PATHOLOGIST REVIEW     Protime-INR   recheck with blood smear and coags, hx of this in the past but no known coagulopathy  3. Elevated LFTs  R94.5 Amylase    Lipase    Hepatitis panel, acute   no ETOH, likely fattly liver? r/o hepatitis, RUQ [place] and labs to further eval, avoid tylenol and ETOH  4. Class 1 obesity with serious comorbidity and body mass index (BMI) of 31.0 to 31.9 in adult, unspecified obesity type  E66.9    Z68.31  low glycemic diet and exercise advised  5. Encounter to establish care with new doctor  Z76.89    will obtain and review records from past PCP in [place]  6. History of nephrectomy, right  Z90.5   7. Hidradenitis suppurativa  L73.2    Managing with Dermatology - goat to get him on accutane     Return in about 3 months (around 12/17/2018) for Routine follow-up.  If starting medications we may need to follow up sooner to recheck, discussed this with pt today.  Delsa Grana, PA-C 09/17/18 11:50 AM

## 2018-09-17 NOTE — Patient Instructions (Signed)
Make sure to watch your diet and try to gradually work towards low glycemic index foods only to avoid spikes in blood sugar and in triglycerides  Avoid alcohol and tylenol right now while we work up the other abnormalities in your labs  High Triglycerides Eating Plan Triglycerides are a type of fat in the blood. High levels of triglycerides can increase your risk of heart disease and stroke. If your triglyceride levels are high, choosing the right foods can help lower your triglycerides and keep your heart healthy. Work with your health care provider or a diet and nutrition specialist (dietitian) to develop an eating plan that is right for you. What are tips for following this plan? General guidelines   Lose weight, if you are overweight. For most people, losing 5-10 lbs (2-5 kg) helps lower triglyceride levels. A weight-loss plan may include. ? 30 minutes of exercise at least 5 days a week. ? Reducing the amount of calories, sugar, and fat you eat.  Eat a wide variety of fresh fruits, vegetables, and whole grains. These foods are high in fiber.  Eat foods that contain healthy fats, such as fatty fish, nuts, seeds, and olive oil.  Avoid foods that are high in added sugar, added salt (sodium), saturated fat, and trans fat.  Avoid low-fiber, refined carbohydrates such as white bread, crackers, noodles, and white rice.  Avoid foods with partially hydrogenated oils (trans fats), such as fried foods or stick margarine.  Limit alcohol intake to no more than 1 drink a day for nonpregnant women and 2 drinks a day for men. One drink equals 12 oz of beer, 5 oz of wine, or 1 oz of hard liquor. Your health care provider may recommend that you drink less depending on your overall health. Reading food labels  Check food labels for the amount of saturated fat. Choose foods with no or very little saturated fat.  Check food labels for the amount of trans fat. Choose foods with no trans fat.  Check  food labels for the amount of cholesterol. Choose foods low in cholesterol. Ask your dietitian how much cholesterol you should have each day.  Check food labels for the amount of sodium. Choose foods with less than 140 milligrams (mg) per serving. Shopping  Buy dairy products labeled as nonfat (skim) or low-fat (1%).  Avoid buying processed or prepackaged foods. These are often high in added sugar, sodium, and fat. Cooking  Choose healthy fats when cooking, such as olive oil or canola oil.  Cook foods using lower fat methods, such as baking, broiling, boiling, or grilling.  Make your own sauces, dressings, and marinades when possible, instead of buying them. Store-bought sauces, dressings, and marinades are often high in sodium and sugar. Meal planning  Eat more home-cooked food and less restaurant, buffet, and fast food.  Eat fatty fish at least 2 times each week. Examples of fatty fish include salmon, trout, mackerel, tuna, and herring.  If you eat whole eggs, do not eat more than 3 egg yolks per week. What foods are recommended? The items listed may not be a complete list. Talk with your dietitian about what dietary choices are best for you. Grains Whole wheat or whole grain breads, crackers, cereals, and pasta. Unsweetened oatmeal. Bulgur. Barley. Quinoa. Brown rice. Whole wheat flour tortillas. Vegetables Fresh or frozen vegetables. Low-sodium canned vegetables. Fruits All fresh, canned (in natural juice), or frozen fruits. Meats and other protein foods Skinless chicken or [place]. Ground chicken or [place]. [employer]  cuts of pork, trimmed of fat. Fish and seafood, especially salmon, trout, and herring. Egg whites. Dried beans, peas, or lentils. Unsalted nuts or seeds. Unsalted canned beans. Natural peanut or almond butter. Dairy Low-fat dairy products. Skim or low-fat (1%) milk. Reduced fat (2%) and low-sodium cheese. Low-fat ricotta cheese. Low-fat cottage cheese. Plain, low-fat  yogurt. Fats and oils Tub margarine without trans fats. Light or reduced-fat mayonnaise. Light or reduced-fat salad dressings. Avocado. Safflower, olive, sunflower, soybean, and canola oils. What foods are not recommended? The items listed may not be a complete list. Talk with your dietitian about what dietary choices are best for you. Grains White bread. White (regular) pasta. White rice. Cornbread. Bagels. Pastries. Crackers that contain trans fat. Vegetables Creamed or fried vegetables. Vegetables in a cheese sauce. Fruits Sweetened dried fruit. Canned fruit in syrup. Fruit juice. Meats and other protein foods Fatty cuts of meat. Ribs. Chicken wings. Berniece Salines. Sausage. Bologna. Salami. Chitterlings. Fatback. Hot dogs. Bratwurst. Packaged lunch meats. Dairy Whole or reduced-fat (2%) milk. Half-and-half. Cream cheese. Full-fat or sweetened yogurt. Full-fat cheese. Nondairy creamers. Whipped toppings. Processed cheese or cheese spreads. Cheese curds. Beverages Alcohol. Sweetened drinks, such as soda, lemonade, fruit drinks, or punches. Fats and oils Butter. Stick margarine. Lard. Shortening. Ghee. Bacon fat. Tropical oils, such as coconut, palm kernel, or palm oils. Sweets and desserts Corn syrup. Sugars. Honey. Molasses. Candy. Jam and jelly. Syrup. Sweetened cereals. Cookies. Pies. Cakes. Donuts. Muffins. Ice cream. Condiments Store-bought sauces, dressings, and marinades that are high in sugar, such as ketchup and barbecue sauce. Summary  High levels of triglycerides can increase the risk of heart disease and stroke. Choosing the right foods can help lower your triglycerides.  Eat plenty of fresh fruits, vegetables, and whole grains. Choose low-fat dairy and lean meats. Eat fatty fish at least twice a week.  Avoid processed and prepackaged foods with added sugar, sodium, saturated fat, and trans fat.  If you need suggestions or have questions about what types of food are good for you,  talk with your health care provider or a dietitian. This information is not intended to replace advice given to you by your health care provider. Make sure you discuss any questions you have with your health care provider. Document Released: 10/11/2003 Document Revised: 12/05/2016 Document Reviewed: 02/26/2016 Elsevier Patient Education  2020 [employer].

## 2018-09-20 LAB — LIPASE: Lipase: 50 U/L (ref 7–60)

## 2018-09-20 LAB — AMYLASE: Amylase: 68 U/L (ref 21–101)

## 2018-09-20 LAB — CBC (INCLUDES DIFF/PLT) WITH PATHOLOGIST REVIEW
Absolute Monocytes: 793 cells/uL (ref 200–950)
Basophils Absolute: 124 cells/uL (ref 0–200)
Basophils Relative: 1.2 %
Eosinophils Absolute: 216 cells/uL (ref 15–500)
Eosinophils Relative: 2.1 %
HCT: 44.1 % (ref 38.5–50.0)
Hemoglobin: 15 g/dL (ref 13.2–17.1)
Lymphs Abs: 3523 cells/uL (ref 850–3900)
MCH: 30.2 pg (ref 27.0–33.0)
MCHC: 34 g/dL (ref 32.0–36.0)
MCV: 88.7 fL (ref 80.0–100.0)
MPV: 9 fL (ref 7.5–12.5)
Monocytes Relative: 7.7 %
Neutro Abs: 5644 cells/uL (ref 1500–7800)
Neutrophils Relative %: 54.8 %
Platelets: 561 10*3/uL — ABNORMAL HIGH (ref 140–400)
RBC: 4.97 10*6/uL (ref 4.20–5.80)
RDW: 13.6 % (ref 11.0–15.0)
Total Lymphocyte: 34.2 %
WBC: 10.3 10*3/uL (ref 3.8–10.8)

## 2018-09-20 LAB — COMPLETE METABOLIC PANEL WITH GFR
AG Ratio: 1.5 (calc) (ref 1.0–2.5)
ALT: 78 U/L — ABNORMAL HIGH (ref 9–46)
AST: 36 U/L (ref 10–40)
Albumin: 4.8 g/dL (ref 3.6–5.1)
Alkaline phosphatase (APISO): 104 U/L (ref 36–130)
BUN: 16 mg/dL (ref 7–25)
CO2: 25 mmol/L (ref 20–32)
Calcium: 10.9 mg/dL — ABNORMAL HIGH (ref 8.6–10.3)
Chloride: 100 mmol/L (ref 98–110)
Creat: 1.08 mg/dL (ref 0.60–1.35)
GFR, Est African American: 103 mL/min/{1.73_m2} (ref >=60)
GFR, Est Non African American: 88 mL/min/{1.73_m2} (ref >=60)
Globulin: 3.1 g/dL (calc) (ref 1.9–3.7)
Glucose, Bld: 74 mg/dL (ref 65–99)
Potassium: 4.9 mmol/L (ref 3.5–5.3)
Sodium: 136 mmol/L (ref 135–146)
Total Bilirubin: 0.3 mg/dL (ref 0.2–1.2)
Total Protein: 7.9 g/dL (ref 6.1–8.1)

## 2018-09-20 LAB — LIPID PANEL
Cholesterol: 322 mg/dL — ABNORMAL HIGH (ref <200)
HDL: 41 mg/dL (ref >=40)
Non-HDL Cholesterol (Calc): 281 mg/dL (calc) — ABNORMAL HIGH (ref <130)
Total CHOL/HDL Ratio: 7.9 (calc) — ABNORMAL HIGH (ref <5.0)
Triglycerides: 681 mg/dL — ABNORMAL HIGH (ref <150)

## 2018-09-20 LAB — HEPATITIS PANEL, ACUTE
Hep A IgM: NONREACTIVE
Hep B C IgM: NONREACTIVE
Hepatitis B Surface Ag: NONREACTIVE
Hepatitis C Ab: NONREACTIVE
SIGNAL TO CUT-OFF: 0.05 (ref <1.00)

## 2018-09-20 LAB — PROTIME-INR
INR: 1
Prothrombin Time: 10.3 s (ref 9.0–11.5)

## 2018-09-21 MED ORDER — ATORVASTATIN CALCIUM 20 MG PO TABS: 20.0000 mg | ORAL_TABLET | Freq: Every day | ORAL | 1 refills | Status: DC

## 2018-09-21 NOTE — Addendum Note (Signed)
Addended by: Delsa Grana on: 09/21/2018 01:54 PM   Modules accepted: Orders

## 2018-09-27 ENCOUNTER — Other Ambulatory Visit: Payer: Self-pay

## 2018-09-27 ENCOUNTER — Inpatient Hospital Stay: Payer: Medicaid Other

## 2018-09-27 ENCOUNTER — Encounter: Payer: Self-pay | Admitting: Internal Medicine

## 2018-09-27 ENCOUNTER — Inpatient Hospital Stay: Payer: Medicaid Other | Attending: Internal Medicine | Admitting: Internal Medicine

## 2018-09-27 VITALS — BP 133/93 | HR 80 | Temp 98.6°F | Resp 20 | Ht 69.0 in | Wt 215.0 lb

## 2018-09-27 DIAGNOSIS — F17210 Nicotine dependence, cigarettes, uncomplicated: Secondary | ICD-10-CM

## 2018-09-27 DIAGNOSIS — R7989 Other specified abnormal findings of blood chemistry: Secondary | ICD-10-CM | POA: Diagnosis not present

## 2018-09-27 DIAGNOSIS — Z8042 Family history of malignant neoplasm of prostate: Secondary | ICD-10-CM | POA: Insufficient documentation

## 2018-09-27 DIAGNOSIS — Z79899 Other long term (current) drug therapy: Secondary | ICD-10-CM | POA: Insufficient documentation

## 2018-09-27 DIAGNOSIS — E785 Hyperlipidemia, unspecified: Secondary | ICD-10-CM | POA: Diagnosis not present

## 2018-09-27 DIAGNOSIS — Z87891 Personal history of nicotine dependence: Secondary | ICD-10-CM | POA: Diagnosis not present

## 2018-09-27 DIAGNOSIS — D473 Essential (hemorrhagic) thrombocythemia: Secondary | ICD-10-CM

## 2018-09-27 DIAGNOSIS — L732 Hidradenitis suppurativa: Secondary | ICD-10-CM | POA: Diagnosis not present

## 2018-09-27 DIAGNOSIS — Z8249 Family history of ischemic heart disease and other diseases of the circulatory system: Secondary | ICD-10-CM

## 2018-09-27 DIAGNOSIS — Z833 Family history of diabetes mellitus: Secondary | ICD-10-CM

## 2018-09-27 DIAGNOSIS — Z820 Family history of epilepsy and other diseases of the nervous system: Secondary | ICD-10-CM

## 2018-09-27 DIAGNOSIS — Z905 Acquired absence of kidney: Secondary | ICD-10-CM | POA: Insufficient documentation

## 2018-09-27 DIAGNOSIS — D75839 Thrombocytosis, unspecified: Secondary | ICD-10-CM

## 2018-09-27 LAB — BASIC METABOLIC PANEL
Anion gap: 11 (ref 5–15)
BUN: 17 mg/dL (ref 6–20)
CO2: 24 mmol/L (ref 22–32)
Calcium: 9.7 mg/dL (ref 8.9–10.3)
Chloride: 101 mmol/L (ref 98–111)
Creatinine, Ser: 1.23 mg/dL (ref 0.61–1.24)
GFR calc Af Amer: >60 mL/min (ref >60)
GFR calc non Af Amer: >60 mL/min (ref >60)
Glucose, Bld: 89 mg/dL (ref 70–99)
Potassium: 4.1 mmol/L (ref 3.5–5.1)
Sodium: 136 mmol/L (ref 135–145)

## 2018-09-27 LAB — LACTATE DEHYDROGENASE: LDH: 126 U/L (ref 98–192)

## 2018-09-27 LAB — C-REACTIVE PROTEIN: CRP: 1.8 mg/dL — ABNORMAL HIGH (ref <1.0)

## 2018-09-27 NOTE — Assessment & Plan Note (Addendum)
#  Thrombocytosis -question essential thrombocytosis versus reactive [question secondary to hydradenitis].  However given patient's longstanding elevated platelets I suspect-primary bone marrow process.   # Patient is asymptomatic.I had a long discussion with the patient regarding potential cause of his abnormal blood counts.   For now I  recommend checkingCBC CMP jak 2 BCR ABL; MPL; CALR mutation on the peripheral blood.LDH;  Check peripheral smear. Also discussed regarding bone marrow biopsy with the above workup is inconclusive; however I would prefer not to do a bone marrow unless absolutely needed.  #Elevated calcium 10.9-unclear etiology.  Recommend BMP; ionized calcium; and also checking parathyroid hormone.  #Hidradenitis-awaiting repeat evaluation with dermatology.  Patient's current blood work/including elevated platelets should not restrict dermatology treatment from hematologic standpoint.  # # DISPOSITION: # labs- today # follow up in 2 week- MD; no labs- dr.B  # Thank you Ms.Lucio Edward; PA-C for allowing me to participate in the care of your pleasant patient. Please do not hesitate to contact me with questions or concerns in the interim.  Addendum: Today's CBC was not ordered/no peripheral smear.  Will await above work-up  Cc; PCP

## 2018-09-27 NOTE — Progress Notes (Signed)
[facility] CONSULT NOTE  Patient Care Team: Delsa Grana, PA-C as PCP - General (Family Medicine)  CHIEF COMPLAINTS/PURPOSE OF CONSULTATION: Thrombocytosis  HEMATOLOGY HISTORY  #Question chronic THROMBOCYTOSIS [platelets-500-600; Hb;N- white count-N; as per history-recommended bone marrow biopsy as a child; not done]  #Hypercalcemia calcium 10.9 [aug 2020]  #Hydradenitis-scalp/ dermatology   HISTORY OF PRESENTING ILLNESS:  Mario Boyd 33 y.o.  male pleasant patient was been referred to [place] for further evaluation of elevated platelets  which was incidentally found on blood work.   More recently he was being evaluated by dermatology for treatment of hidradenitis-but noted to have elevated platelets.  Of note he was also found to have elevated calcium.  Patient denies any history of blood clots or strokes. Denies any burning pain or discoloration in the fingertips or toes.   Of note patient states that he was told to have elevated platelets even when he was a child.  He was recommended a bone marrow biopsy/however his mother decided against it.   He has chronic sweats/not new.  No weight loss.  No fevers.  Patient denies history of splenectomy or history of steroids use.  No smoking    Review of Systems  Constitutional: Negative for chills, diaphoresis, fever, malaise/fatigue and weight loss.  HENT: Negative for nosebleeds and sore throat.   Eyes: Negative for double vision.  Respiratory: Negative for cough, hemoptysis, sputum production, shortness of breath and wheezing.   Cardiovascular: Negative for chest pain, palpitations, orthopnea and leg swelling.  Gastrointestinal: Negative for abdominal pain, blood in stool, constipation, diarrhea, heartburn, melena, nausea and vomiting.  Genitourinary: Negative for dysuria, frequency and urgency.  Musculoskeletal: Negative for back pain and joint pain.  Skin: Negative.  Negative for itching and rash.  Neurological:  Negative for dizziness, tingling, focal weakness, weakness and headaches.  Endo/Heme/Allergies: Does not bruise/bleed easily.  Psychiatric/Behavioral: Negative for depression. The patient is not nervous/anxious and does not have insomnia.     MEDICAL HISTORY:  Past Medical History:  Diagnosis Date  . Asthma    childhood  . Hidradenitis suppurativa   . Hyperlipidemia   . Seizures ([place])    as a child    SURGICAL HISTORY: Past Surgical History:  Procedure Laterality Date  . HERNIA REPAIR    . KIDNEY SURGERY    . NEPHRECTOMY LIVING DONOR Right 2009  . TONSILLECTOMY    . TYMPANOSTOMY TUBE PLACEMENT      SOCIAL HISTORY: Social History   Socioeconomic History  . Marital status: Married    Spouse name: alice  . Number of children: 2  . Years of education: 24  . Highest education level: Associate degree: occupational, Hotel manager, or vocational program  Occupational History  . Occupation: unemployed  Social Needs  . Financial resource strain: Not hard at all  . Food insecurity    Worry: Never true    Inability: Never true  . Transportation needs    Medical: No    Non-medical: No  Tobacco Use  . Smoking status: Former Smoker    Packs/day: 0.50    Years: 15.00    Pack years: 7.50    Types: Cigarettes  . Smokeless tobacco: Never Used  Substance and Sexual Activity  . Alcohol use: Never    Frequency: Never  . Drug use: Never  . Sexual activity: Yes    Comment: wife on birth control, 9 years  Lifestyle  . Physical activity    Days per week: 0 days    Minutes  per session: 0 min  . Stress: Not at all  Relationships  . Social Herbalist on phone: Once a week    Gets together: Once a week    Attends religious service: Never    Active member of club or organization: No    Attends meetings of clubs or organizations: Never    Relationship status: Married  . Intimate partner violence    Fear of current or ex partner: No    Emotionally abused: No     Physically abused: No    Forced sexual activity: No  Other Topics Concern  . Not on file  Social History Narrative   Vape; quit smoking; no alcohol; works at [employer]; in [place] 2 children.     FAMILY HISTORY: Family History  Problem Relation Age of Onset  . Hyperlipidemia Mother   . Hypertension Mother   . Other Mother        thrombocytosis  . Hyperlipidemia Father   . Hypertension Father   . Epilepsy Sister   . Hypertension Sister   . Epilepsy Son   . Heart failure Maternal Grandmother   . Diabetes Maternal Grandfather   . Heart failure Maternal Grandfather   . Other Maternal Grandfather        thrombocytosis  . Diabetes Paternal Grandmother   . Hypertension Paternal Grandfather   . Prostate cancer Paternal Grandfather     ALLERGIES:  has No Known Allergies.  MEDICATIONS:  Current Outpatient Medications  Medication Sig Dispense Refill  . atorvastatin (LIPITOR) 20 MG tablet Take 1 tablet (20 mg total) by mouth daily. 30 tablet 1  . sulfamethoxazole-trimethoprim (BACTRIM DS) 800-160 MG tablet Take 1 tablet by mouth 2 (two) times daily.      No current facility-administered medications for this visit.       Vitals:   09/27/18 1107  BP: (!) 133/93  Pulse: 80  Resp: 20  Temp: 98.6 F (37 C)   Filed Weights   09/27/18 1107  Weight: 215 lb (97.5 kg)    Physical Exam  Constitutional: He is oriented to person, place, and time and well-developed, well-nourished, and in no distress.  HENT:  Head: Normocephalic and atraumatic.  Mouth/Throat: Oropharynx is clear and moist. No oropharyngeal exudate.  Eyes: Pupils are equal, round, and reactive to light.  Neck: Normal range of motion. Neck supple.  Cardiovascular: Normal rate and regular rhythm.  Pulmonary/Chest: Effort normal. No respiratory distress. He has no wheezes.  Abdominal: Soft. Bowel sounds are normal. He exhibits no distension and no mass. There is no abdominal tenderness. There is no rebound  and no guarding.  Musculoskeletal: Normal range of motion.        General: No tenderness or edema.  Neurological: He is alert and oriented to person, place, and time.  Skin: Skin is warm.  Folliculitis like lesions noted on the scalp.  Psychiatric: Affect normal.    LABORATORY DATA:  I have reviewed the data as listed Lab Results  Component Value Date   WBC 10.3 09/17/2018   HGB 15.0 09/17/2018   HCT 44.1 09/17/2018   MCV 88.7 09/17/2018   PLT 561 (H) 09/17/2018   Recent Labs    09/06/18 09/17/18 0000  NA 138 136  K 4.6 4.9  CL  --  100  CO2  --  25  GLUCOSE  --  74  BUN 13 16  CREATININE 1.0 1.08  CALCIUM  --  10.9*  GFRNONAA  --  88  GFRAA  --  103  PROT  --  7.9  AST 31 36  ALT 59* 78*  ALKPHOS 155*  --   BILITOT  --  0.3     No results found.  ASSESSMENT & PLAN:   Thrombocytosis ([place]) # Thrombocytosis -question essential thrombocytosis versus reactive [question secondary to hydradenitis].  However given patient's longstanding elevated platelets I suspect-primary bone marrow process.   # Patient is asymptomatic.I had a long discussion with the patient regarding potential cause of his abnormal blood counts.   For now I  recommend checkingCBC CMP jak 2 BCR ABL; MPL; CALR mutation on the peripheral blood.LDH;  Check peripheral smear. Also discussed regarding bone marrow biopsy with the above workup is inconclusive; however I would prefer not to do a bone marrow unless absolutely needed.  #Elevated calcium 10.9-unclear etiology.  Recommend BMP; ionized calcium; and also checking parathyroid hormone.  #Hidradenitis-awaiting repeat evaluation with dermatology.  Patient's current blood work/including elevated platelets should not restrict dermatology treatment from hematologic standpoint.  # # DISPOSITION: # labs- today # follow up in 2 week- MD; no labs- dr.B  # Thank you Ms.Lucio Edward; PA-C for allowing me to participate in the care of your pleasant patient. Please  do not hesitate to contact me with questions or concerns in the interim.  Addendum: Today's CBC was not ordered/no peripheral smear.  Will await above work-up  Cc; PCP      Cammie Sickle, MD 09/27/2018 12:18 PM

## 2018-09-28 LAB — PTH, INTACT AND CALCIUM
Calcium, Total (PTH): 10.5 mg/dL — ABNORMAL HIGH (ref 8.7–10.2)
PTH: 35 pg/mL (ref 15–65)

## 2018-09-28 LAB — CALCIUM, IONIZED: Calcium, Ionized, Serum: 5.3 mg/dL (ref 4.5–5.6)

## 2018-10-01 ENCOUNTER — Ambulatory Visit: Admit: 2018-10-01 | Discharge: 2018-10-02 | Payer: MEDICAID | Attending: Dermatology | Primary: Dermatology

## 2018-10-01 DIAGNOSIS — L663 Perifolliculitis capitis abscedens: Secondary | ICD-10-CM

## 2018-10-01 DIAGNOSIS — L701 Acne conglobata: Secondary | ICD-10-CM

## 2018-10-01 DIAGNOSIS — Z79899 Other long term (current) drug therapy: Secondary | ICD-10-CM

## 2018-10-01 DIAGNOSIS — L03811 Cellulitis of head [any part, except face]: Secondary | ICD-10-CM

## 2018-10-01 DIAGNOSIS — L732 Hidradenitis suppurativa: Secondary | ICD-10-CM

## 2018-10-01 LAB — CALRETICULIN (CALR) MUTATION ANALYSIS

## 2018-10-01 MED ORDER — SULFAMETHOXAZOLE 800 MG-TRIMETHOPRIM 160 MG TABLET
ORAL_TABLET | Freq: Two times a day (BID) | ORAL | 1 refills | 30.00000 days | Status: CP
Start: 2018-10-01 — End: ?

## 2018-10-01 MED ORDER — HUMIRA PEN CITRATE FREE STARTER PACK FOR CROHN'S/UC/HS 3 X 80 MG/0.8 ML
SUBCUTANEOUS | 0 refills | 210.00000 days | Status: CP
Start: 2018-10-01 — End: 2019-04-29
  Filled 2018-10-08: qty 3, 28d supply, fill #0

## 2018-10-01 NOTE — Unmapped (Signed)
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  - Discuss opt of Humira qweekly; Discussed risks/benefits of management options, including risks of adverse events and warning symptoms for which to monitor  - PMHX negative for liver/kidney disease, IBD, demyelinating/neurologic disease (e.g. MS), TB infection, malignancy, heart failure, hepatitis C/B, HIV  - Provided handout with gentle skin care product recommendations   - Continue Bactrim until Humira start  - HUMIRA PEN CITRATE FREE 40 MG/0.4 ML; Inject 160 mg (4 pens) on Day 0 and 80 mg on Day 15; 40 mg qweekly on Day 29  Dispense: 8 pen; Refill: 3  - sulfamethoxazole-trimethoprim (BACTRIM DS) 800-160 mg per tablet; Take 1 tablet (160 mg of trimethoprim total) by mouth Two (2) times a day.  Dispense: 60 tablet; Refill: 1    High Risk Medication Use  - Hypertriglyceridemia being worked up via [facility]  - Hepatitis panel WNL via [facility]  - Will draw Quantiferon Gold and HIV today      Return in about 2 months (around 12/01/2018) for Recheck.      CC:  New patient here for evaluation of lesions on the scalp and face    HPI  Reginald Conway is a friendly 33 y.o. year old male is being seen for follow up for dissecting cellulitis of the scalp, last seen by Dr. Izora Ribas on 09/03/2018. He was started on isotretinoin, which he did not end up taking due to elevate triglycerides during baseline bloodwork. He is now on Lipitor. He has been taking DS bactrim BID. He feels like his scalp has gotten a little better. He does endorse a new flare about 3 days ago.     No other concerns at this time.    Pertinent PMH:   No history of skin cancer  Donated a kidney    FH:   No family history of melanoma   Sister with Hidradenitis Suppurativa on Humira  Hx of HTN as a child    SH:  Lives in [place], [place] ROS:  Negative ROS unless otherwise stated in the HPI.    PE:  General: Well-appearing male in no distress.  Neuro: Alert and oriented x 3, answering questions appropriately.  Skin: Examination of the face, neck, chest, abdomen, back, and bilateral upper extremities was performed and notable for the following:  - Large comedones and inflammatory nodulocystic lesions, some draining, on the scalp with associated alopecia  - Inflammatory papule and pustules on the face and chest  - All other areas examined were normal or had no significant findings  ______________________________________________________________________

## 2018-10-04 LAB — QUANTIFERON TB GOLD PLUS
QUANTIFERON MITOGEN: 10 [IU]/mL
QUANTIFERON TB GOLD PLUS: NEGATIVE
QUANTIFERON TB NIL VALUE: 0 [IU]/mL

## 2018-10-04 LAB — TB AG2 VALUE: Lab: 0.03

## 2018-10-04 LAB — TB AG1 VALUE: Lab: 0.01

## 2018-10-04 LAB — TB NIL VALUE: Lab: 0

## 2018-10-04 LAB — TB MITOGEN VALUE: Lab: 10

## 2018-10-04 LAB — QUANTIFERON ANTIGEN 1 MINUS NIL: Lab: 0.01

## 2018-10-04 NOTE — Unmapped (Signed)
[facility] Specialty Medication Onboarding    Specialty Medication: HUMIRA (CF) PENS  Prior Authorization: Approved   Financial Assistance: No - copay  <$25  Final Copay/Day Supply: $3 / 14 DAYS    Insurance Restrictions: None     Notes to Pharmacist:     The triage team has completed the benefits investigation and has determined that the patient is able to fill this medication at [facility]. Please contact the patient to complete the onboarding or follow up with the prescribing physician as needed.

## 2018-10-05 LAB — MPL MUTATION ANALYSIS

## 2018-10-05 LAB — JAK2 GENOTYPR

## 2018-10-07 LAB — BCR-ABL1 FISH
Cells Analyzed: 200
Cells Counted: 200

## 2018-10-07 MED ORDER — EMPTY CONTAINER
2 refills | 0 days
Start: 2018-10-07 — End: ?

## 2018-10-07 NOTE — Unmapped (Signed)
[facility]   Patient Onboarding/Medication Counseling    Reginald Conway is a 33 y.o. male with hidradenitis who I am counseling today on initiation of therapy.  I am speaking to the patient.    Verified patient's date of birth / HIPAA.    Specialty medication(s) to be sent: Inflammatory Disorders: Humira      Non-specialty medications/supplies to be sent: sharps kit      Medications not needed at this time: na         Humira (adalimumab)    Medication & Administration     Dosage: Hidradenitis supprativa: Inject 160mg  under the skin on day 1, 80mg  on day 15, then 40mg  every 7 days starting on day 29    Lab tests required prior to treatment initiation:  ? Tuberculosis: Tuberculosis screening resulted in a non-reactive Quantiferon TB Gold assay.  ? Hepatitis B: Hepatitis B serology studies are complete and non-reactive.    Administration:     Prefilled auto-injector pen  1. Gather all supplies needed for injection on a clean, flat working surface: medication pen removed from packaging, alcohol swab, sharps container, etc.  2. Look at the medication label - look for correct medication, correct dose, and check the expiration date  3. Look at the medication - the liquid visible in the window on the side of the pen device should appear clear and colorless  4. Lay the auto-injector pen on a flat surface and allow it to warm up to room temperature for at least 30-45 minutes  5. Select injection site - you can use the front of your thigh or your belly (but not the area 2 inches around your belly button); if someone else is giving you the injection you can also use your upper arm in the skin covering your triceps muscle  6. Prepare injection site - wash your hands and clean the skin at the injection site with an alcohol swab and let it air dry, do not touch the injection site again before the injection  7. Pull the 2 safety caps straight off - gray/white to uncover the needle cover and the plum cap to uncover the plum activator button, do not remove until immediately prior to injection and do not touch the white needle cover  8. Gently squeeze the area of cleaned skin and hold it firmly to create a firm surface at the selected injection site  9. Put the white needle cover against your skin at the injection site at a 90 degree angle, hold the pen such that you can see the clear medication window  10. Press down and hold the pen firmly against your skin, press the plum activator button to initiate the injection, there will be a click when the injection starts  11. Continue to hold the pen firmly against your skin for about 10-15 seconds - the window will start to turn solid yellow  12. To verify the injection is complete after 10-15 seconds, look and ensure the window is solid yellow and then pull the pen away from your skin  13. Dispose of the used auto-injector pen immediately in your sharps disposal container the needle will be covered automatically  14. If you see any blood at the injection site, press a cotton ball or gauze on the site and maintain pressure until the bleeding stops, do not rub the injection site    Adherence/Missed dose instructions:  If your injection is given more than 3 days after your scheduled injection  date ??? consult your pharmacist for additional instructions on how to adjust your dosing schedule.    Goals of Therapy     - Reduce the frequency and severity of new lesions  - Minimize pain and suppuration  - Prevent disease progression and limit scarring  - Maintenance of effective psychosocial functioning    Side Effects & Monitoring Parameters     ? Injection site reaction (redness, irritation, inflammation localized to the site of administration)  ? Signs of a common cold ??? minor sore throat, runny or stuffy nose, etc.  ? Upset stomach  ? Headache    The following side effects should be reported to the provider:  ? Signs of a hypersensitivity reaction ??? rash; hives; itching; red, swollen, blistered, or peeling skin; wheezing; tightness in the chest or throat; difficulty breathing, swallowing, or talking; swelling of the mouth, face, lips, tongue, or throat; etc.  ? Reduced immune function ??? report signs of infection such as fever; chills; body aches; very bad sore throat; ear or sinus pain; cough; more sputum or change in color of sputum; pain with passing urine; wound that will not heal, etc.  Also at a slightly higher risk of some malignancies (mainly skin and blood cancers) due to this reduced immune function.  o In the case of signs of infection ??? the patient should hold the next dose of Humira?? and call your primary care provider to ensure adequate medical care.  Treatment may be resumed when infection is treated and patient is asymptomatic.  ? Changes in skin ??? a new growth or lump that forms; changes in shape, size, or color of a previous mole or marking  ? Signs of unexplained bruising or bleeding ??? throwing up blood or emesis that looks like coffee grounds; black, tarry, or bloody stool; etc.  ? Signs of new or worsening heart failure ??? shortness of breath; sudden weight gain; heartbeat that is not normal; swelling in the arms or legs that is new or worse      Contraindications, Warnings, & Precautions     ? Have your bloodwork checked as you have been told by your prescriber  ? Talk with your doctor if you are pregnant, planning to become pregnant, or breastfeeding  ? Discuss the possible need for holding your dose(s) of Humira?? when a planned procedure is scheduled with the prescriber as it may delay healing/recovery timeline       Drug/Food Interactions     ? Medication list reviewed in Epic. The patient was instructed to inform the care team before taking any new medications or supplements. No drug interactions identified.   ? Talk with you prescriber or pharmacist before receiving any live vaccinations while taking this medication and after you stop taking it    Storage, Handling Precautions, & Disposal     ? Store this medication in the refrigerator.  Do not freeze  ? If needed, you may store at room temperature for up to 14 days  ? Store in [employer], protected from light  ? Do not shake  ? Dispose of used syringes/pens in a sharps disposal container            Current Medications (including OTC/herbals), Comorbidities and Allergies     Current Outpatient Medications   Medication Sig Dispense Refill   ??? atorvastatin (LIPITOR) 20 MG tablet Take 20 mg by mouth daily.     ??? empty container Misc Use as directed to dispose of Humira pens. 1  each 2   ??? HUMIRA PEN CITRATE FREE 40 MG/0.4 ML Inject the contents of 4 pens (160 mg total) under the skin on Day 0, THEN inject the contents of 2 pens (80 mg total) on Day 15, THEN inject the contents of 1 pen (40 mg total) on Day 29 and once weekly from thereafter. 8 each 3   ??? ISOtretinoin (ACCUTANE) 40 MG capsule Take 1 capsule (40 mg total) by mouth daily. 30 capsule 0   ??? predniSONE (DELTASONE) 10 mg tablet pack Take by mouth.     ??? predniSONE (DELTASONE) 10 mg tablet pack PLEASE SEE ATTACHED FOR DETAILED DIRECTIONS     ??? predniSONE (DELTASONE) 10 MG tablet Take 40mg  daily x7 days, then 30mg  daily x7 days, then 20mg  daily x7 days, then 10mg  daily x7 days, then stop 70 tablet 0   ??? sulfamethoxazole-trimethoprim (BACTRIM DS) 800-160 mg per tablet Take 1 tablet (160 mg of trimethoprim total) by mouth Two (2) times a day. 60 tablet 1     No current facility-administered medications for this visit.        No Known Allergies    There is no problem list on file for this patient.      Reviewed and up to date in Epic.    Appropriateness of Therapy     Is medication and dose appropriate based on diagnosis? Yes    Baseline Quality of Life Assessment      How many days over the past month did your HS keep you from your normal activities? 0    Financial Information     Medication Assistance provided: Prior Authorization    Anticipated copay of $3 reviewed with patient. Verified delivery address.    Delivery Information     Scheduled delivery date: Friday, Oct 2    Expected start date: Friday, Oct 2    Medication will be delivered via Same Day Courier to the home address in [place].  This shipment will not require a signature.      Explained the services we provide at [facility] and that each month we would call to set up refills.  Stressed importance of returning phone calls so that we could ensure they receive their medications in time each month.  Informed patient that we should be setting up refills 7-10 days prior to when they will run out of medication.  A pharmacist will reach out to perform a clinical assessment periodically.  Informed patient that a welcome packet and a drug information handout will be sent.      Patient verbalized understanding of the above information as well as how to contact the pharmacy at [phone] option [phone] with any questions/concerns.  The pharmacy is open Monday through Friday 8:30am-4:30pm.  A pharmacist is available 24/7 via pager to answer any clinical questions they may have.    Patient Specific Needs     ? Does the patient have any physical, cognitive, or cultural barriers? No    ? Patient prefers to have medications discussed with  Patient     ? Is the patient able to read and understand education materials at a high school level or above? No    ? Patient's primary language is  English     ? Is the patient high risk? No     ? Does the patient require a Care Management Plan? No     ? Does the patient require physician intervention or other additional services (  i.e. nutrition, smoking cessation, social work)? No      Sona Nations A Desiree Lucy Shared [facility] Specialty Pharmacist

## 2018-10-08 MED FILL — EMPTY CONTAINER: 120 days supply | Qty: 1 | Fill #0

## 2018-10-08 MED FILL — EMPTY CONTAINER: 120 days supply | Qty: 1 | Fill #0 | Status: AC

## 2018-10-08 MED FILL — HUMIRA PEN CITRATE FREE STARTER PACK FOR CROHN'S/UC/HS 3 X 80 MG/0.8 ML: 28 days supply | Qty: 3 | Fill #0 | Status: AC

## 2018-10-08 NOTE — Progress Notes (Signed)
Called patient no answer left message  

## 2018-10-11 ENCOUNTER — Encounter: Payer: Self-pay | Admitting: Internal Medicine

## 2018-10-11 ENCOUNTER — Other Ambulatory Visit: Payer: Self-pay

## 2018-10-11 ENCOUNTER — Inpatient Hospital Stay: Payer: Medicaid Other | Attending: Internal Medicine | Admitting: Internal Medicine

## 2018-10-11 DIAGNOSIS — L732 Hidradenitis suppurativa: Secondary | ICD-10-CM | POA: Diagnosis not present

## 2018-10-11 DIAGNOSIS — D473 Essential (hemorrhagic) thrombocythemia: Secondary | ICD-10-CM | POA: Insufficient documentation

## 2018-10-11 DIAGNOSIS — D75839 Thrombocytosis, unspecified: Secondary | ICD-10-CM
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
## 2018-10-11 NOTE — Assessment & Plan Note (Addendum)
#  Thrombocytosis -likely reactive [secondary inflammation hidradenitis]- jak 2 BCR ABL; MPL; CALR mutation negative on peripheral blood.  I would recommend holding of the bone marrow biopsy at this time.  Will reassess in 6 months-and if platelets still elevated would recommend a bone marrow biopsy.  See below  #Elevated calcium 10.5-10.9/normal PTH; iron is calcium normal.  Defer to PCP.  #Hidradenitis-status post evaluation with dermatology awaiting Humira.  This should not have any contraindication from hematology standpoint.  If patient's counts not improve even after Humira; I think it is reasonable to offer a bone marrow biopsy at the time.  # DISPOSITION:  # Follow up in 6 months- MD; labs- cbc/cmp/LDH/CRP- Dr.B  Cc; PCP

## 2018-10-14 ENCOUNTER — Other Ambulatory Visit: Payer: Self-pay | Admitting: Family Medicine

## 2018-10-14 DIAGNOSIS — E781 Pure hyperglyceridemia: Secondary | ICD-10-CM

## 2018-10-26 DIAGNOSIS — L732 Hidradenitis suppurativa: Principal | ICD-10-CM

## 2018-10-26 DIAGNOSIS — L03811 Cellulitis of head [any part, except face]: Principal | ICD-10-CM

## 2018-10-26 DIAGNOSIS — L663 Perifolliculitis capitis abscedens: Principal | ICD-10-CM

## 2018-10-26 DIAGNOSIS — L701 Acne conglobata: Principal | ICD-10-CM

## 2018-10-26 MED ORDER — HUMIRA PEN CITRATE FREE STARTER PACK FOR CROHN'S/UC/HS 3 X 80 MG/0.8 ML: each | 0 refills | 210 days | Status: AC

## 2018-10-26 NOTE — Unmapped (Signed)
[facility] Clinical Assessment & Refill Coordination Note    Reginald Conway, DOB: 10/29/1985  Phone: [phone] (home)     All above HIPAA information was verified with patient.     Specialty Medication(s):   Inflammatory Disorders: Humira     Current Outpatient Medications   Medication Sig Dispense Refill   ??? atorvastatin (LIPITOR) 20 MG tablet Take 20 mg by mouth daily.     ??? empty container Misc Use as directed to dispose of Humira pens. 1 each 2   ??? HUMIRA PEN CITRATE FREE STARTER PACK FOR CROHN'S/UC/HS 3 X 80 MG/0.8 ML Inject  the contents of 2 pens (160 mg) under the skin on day 1, then 1 pen (80 mg) on day 15. Mainenance dosing will begin on day 29. 3 each 0   ??? ISOtretinoin (ACCUTANE) 40 MG capsule Take 1 capsule (40 mg total) by mouth daily. 30 capsule 0   ??? predniSONE (DELTASONE) 10 mg tablet pack Take by mouth.     ??? predniSONE (DELTASONE) 10 mg tablet pack PLEASE SEE ATTACHED FOR DETAILED DIRECTIONS     ??? predniSONE (DELTASONE) 10 MG tablet Take 40mg  daily x7 days, then 30mg  daily x7 days, then 20mg  daily x7 days, then 10mg  daily x7 days, then stop 70 tablet 0   ??? sulfamethoxazole-trimethoprim (BACTRIM DS) 800-160 mg per tablet Take 1 tablet (160 mg of trimethoprim total) by mouth Two (2) times a day. 60 tablet 1     No current facility-administered medications for this visit.         Changes to medications: Altin Sease reports no changes at this time.    No Known Allergies    Changes to allergies: No    SPECIALTY MEDICATION ADHERENCE     Humira  : 0 days of medicine on hand     Medication Adherence    Patient reported X missed doses in the last month: 0  Specialty Medication: Humira  Patient is on additional specialty medications: No     Specialty medication(s) dose(s) confirmed: Regimen is correct and unchanged.     Are there any concerns with adherence? No    Adherence counseling provided? Not needed    CLINICAL MANAGEMENT AND INTERVENTION Clinical Benefit Assessment:    Do you feel the medicine is effective or helping your condition? Yes, patient reports dramatic decrease in HS symptoms.     Clinical Benefit counseling provided? Not needed    Adverse Effects Assessment:    Are you experiencing any side effects? No    Are you experiencing difficulty administering your medicine? No    Quality of Life Assessment:    How many days over the past month did your hidradenitis keep you from your normal activities? For example, brushing your teeth or getting up in the morning. 0    Have you discussed this with your provider? Not needed    Therapy Appropriateness:    Is therapy appropriate? Yes, therapy is appropriate and should be continued    DISEASE/MEDICATION-SPECIFIC INFORMATION      For patients on injectable medications: Patient currently has 0 doses left.  Next injection is scheduled for 11/08/2018.    PATIENT SPECIFIC NEEDS     ? Does the patient have any physical, cognitive, or cultural barriers? No    ? Is the patient high risk? No     ? Does the patient require a Care Management Plan? No     ? Does the patient require  physician intervention or other additional services (i.e. nutrition, smoking cessation, social work)? No      SHIPPING     Specialty Medication(s) to be Shipped:   Inflammatory Disorders: Humira    Other medication(s) to be shipped: none     Changes to insurance: No    Delivery Scheduled: Yes, Expected medication delivery date: 11/02/2018.     Medication will be delivered via Same Day Courier to the confirmed home address in [facility].    The patient will receive a drug information handout for each medication shipped and additional FDA Medication Guides as required.  Verified that patient has previously received a Conservation officer, historic buildings.    All of the patient's questions and concerns have been addressed.    Annie Sable, PharmD. Candidate    Meghan A. Katrinka Blazing, PharmD, BCPS - Pharmacist   [facility]

## 2018-10-27 DIAGNOSIS — L701 Acne conglobata: Principal | ICD-10-CM

## 2018-10-27 DIAGNOSIS — L03811 Cellulitis of head [any part, except face]: Principal | ICD-10-CM

## 2018-10-27 DIAGNOSIS — L732 Hidradenitis suppurativa: Principal | ICD-10-CM

## 2018-10-27 MED ORDER — HUMIRA PEN CITRATE FREE 40 MG/0.4 ML: 40 mg | each | 5 refills | 28 days | Status: AC

## 2018-11-02 MED FILL — HUMIRA PEN CITRATE FREE 40 MG/0.4 ML: SUBCUTANEOUS | 28 days supply | Qty: 4 | Fill #0

## 2018-11-02 MED FILL — HUMIRA PEN CITRATE FREE 40 MG/0.4 ML: 28 days supply | Qty: 4 | Fill #0 | Status: AC

## 2018-11-11 ENCOUNTER — Telehealth: Payer: Self-pay | Admitting: Family Medicine

## 2018-11-11 NOTE — Telephone Encounter (Signed)
Medication Refill: atorvastatin (LIPITOR) 20 MG tablet [id number]    Pharmacy:  [facility] [place], [place] [phone] (Phone) [phone] (Fax)   Pt aware of turn around time

## 2018-11-12 NOTE — Telephone Encounter (Signed)
Left detailed vm already sent to pharmacy

## 2018-11-16 ENCOUNTER — Telehealth: Payer: Self-pay | Admitting: Family Medicine

## 2018-11-16 NOTE — Telephone Encounter (Signed)
Medication refill: atorvastatin (LIPITOR) 20 MG tablet [id number]    Pharmacy: atorvastatin (LIPITOR) 20 MG tablet [id number]    Called pharmacy and they have no refills available

## 2018-11-16 NOTE — Telephone Encounter (Signed)
Pt called has refiil

## 2018-11-23 NOTE — Unmapped (Signed)
[facility] Refill Coordination Note    Specialty Medication(s) to be Shipped:   Inflammatory Disorders: Humira    Other medication(s) to be shipped: none     Reginald Conway, DOB: 03/14/1985  Phone: [phone] (home)       All above HIPAA information was verified with patient.     Completed refill call assessment today to schedule patient's medication shipment from the [facility] [phone]).       Specialty medication(s) and dose(s) confirmed: Regimen is correct and unchanged.   Changes to medications: Reginald Conway reports no changes at this time.  Changes to insurance: No  Questions for the pharmacist: No    Confirmed patient received Welcome Packet with first shipment. The patient will receive a drug information handout for each medication shipped and additional FDA Medication Guides as required.       DISEASE/MEDICATION-SPECIFIC INFORMATION        N/A    SPECIALTY MEDICATION ADHERENCE     Medication Adherence    Patient reported X missed doses in the last month: 0  Specialty Medication: Humira CF 40 mg/0.4 ml  Patient is on additional specialty medications: No            Humira: 0 days worth of medication on hand.        SHIPPING     Shipping address confirmed in Epic.     Delivery Scheduled: Yes, Expected medication delivery date: 11/25/18.     Medication will be delivered via Same Day Courier to the home address in [place].    [place] A Allie Gerhold   [facility] Specialty Technician

## 2018-11-25 MED FILL — HUMIRA PEN CITRATE FREE 40 MG/0.4 ML: 28 days supply | Qty: 4 | Fill #1 | Status: AC

## 2018-11-25 MED FILL — HUMIRA PEN CITRATE FREE 40 MG/0.4 ML: SUBCUTANEOUS | 28 days supply | Qty: 4 | Fill #1

## 2018-12-06 ENCOUNTER — Ambulatory Visit: Admit: 2018-12-06 | Discharge: 2018-12-07 | Payer: MEDICAID | Attending: Dermatology | Primary: Dermatology

## 2018-12-06 DIAGNOSIS — L03811 Cellulitis of head [any part, except face]: Secondary | ICD-10-CM | POA: Diagnosis not present

## 2018-12-06 DIAGNOSIS — L910 Hypertrophic scar: Secondary | ICD-10-CM | POA: Diagnosis not present

## 2018-12-06 DIAGNOSIS — L732 Hidradenitis suppurativa: Secondary | ICD-10-CM | POA: Diagnosis not present

## 2018-12-06 DIAGNOSIS — L701 Acne conglobata: Secondary | ICD-10-CM | POA: Diagnosis not present

## 2018-12-06 NOTE — Unmapped (Signed)
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  - PMHX negative for liver/kidney disease, IBD, demyelinating/neurologic disease (e.g. MS), TB infection, malignancy, heart failure, hepatitis C/B, HIV  - Continue  HUMIRA PEN CITRATE FREE 40 MG/0.4 ML; 40 mg qweekly    Keloid  -counseled patient on natural course of disease and that it will require multiple injections for improvement   - triamcinolone acetonide (KENALOG-40) injection 40 mg    INTRALESIONAL STEROID INJECTION PROCEDURE NOTE  Verbal informed consent was obtained prior to the procedure, after discussion of the risks (including pain, bleeding, infection, and lack of desired outcome), benefits (including improvement in skin condition), and alternatives (including no procedure). All questions were answered.  The 5 (total number of lesions = 5), located on the scalp, was/were cleaned with alcohol and injected intralesionally with kenalog (triamcinolone 0.1% solution) 40mg /cc x 0.7 cc total. The patient tolerated the procedure well without complications. Verbal post-care instructions were provided to the patient.      High Risk Medication Use  - Hypertriglyceridemia being worked up via [facility]  - Hepatitis panel WNL via [facility]  - Quantiferon Gold and HIV negative from last visit       RTC: Return in about 3 months (around 03/06/2019).       Chief Complaint: HS follow up HPI: Reginald Conway is a 33 y.o. male seen today by Reginald Bender, MD for evaluation of HS. He stopped taking Bactrim the beginning of October. He started taking Humira and it has been a life saver- has been on it since beginning of October. Not using any other treatments at this time. There was some confusion on dosing, so he started every other week and missed a week but doubled the dose the next week. No areas are draining now. Has never had injections of steroids.     The patient was last seen 10/01/18 by Dr. Izora Ribas. At that visit: thep atient was started of Humira and Bactrim     There are no other skin concerns reported today.    Pertinent Past Medical History:  Reviewed     Family History:   Reviewed in Epic.      Social History:   Reviewed in Epic.      Review of Systems:  No fevers, chills, or other systemic or skin complaints. Other than specified in HPI, a balance of 10 systems was reviewed and negative or noncontributory.    Physical Examination:  General: Well-developed, well-nourished male in no acute distress, resting comfortably.  Neuro: A&Ox3, answers questions appropriately  Psych: Congruent mood and affect  Skin: Examination with inspection and palpation of the head (scalp), neck, chest was performed and notable for the following:  - hypertrophic scaring on the posterior scalp  - few comedones on scalp with no active draining  - few inflammatory papules on face and chest    -All other areas not specifically commented on are within normal limits        ______________________________________________________________________  The patient was seen and examined by Reginald Bender, MD who agrees with the assessment and plan as above.

## 2018-12-06 NOTE — Unmapped (Signed)
We injected the scars on your scalp with kenalog today. We can repeat this at the next visit too, it will likely require multiple treatments.

## 2018-12-15 NOTE — Unmapped (Signed)
[facility] Refill Coordination Note    Specialty Medication(s) to be Shipped:   Inflammatory Disorders: Humira    Other medication(s) to be shipped:       Reginald Conway, DOB: 1985-01-20  Phone: [phone] (home)       All above HIPAA information was verified with patient.     Was a Nurse, learning disability used for this call? No    Completed refill call assessment today to schedule patient's medication shipment from the [facility] [phone]).       Specialty medication(s) and dose(s) confirmed: Regimen is correct and unchanged.   Changes to medications: Reginald Conway reports no changes at this time.  Changes to insurance: No  Questions for the pharmacist: No    Confirmed patient received Welcome Packet with first shipment. The patient will receive a drug information handout for each medication shipped and additional FDA Medication Guides as required.       DISEASE/MEDICATION-SPECIFIC INFORMATION        For patients on injectable medications: Patient currently has 1 doses left.  Next injection is scheduled for 12/14.    SPECIALTY MEDICATION ADHERENCE     Medication Adherence    Patient reported X missed doses in the last month: 0  Specialty Medication: humira 40mg /0.64ml  Patient is on additional specialty medications: No  Informant: patient                Humira 40/0.4 mg/ml: 7 days of medicine on hand         SHIPPING     Shipping address confirmed in Epic.     Delivery Scheduled: Yes, Expected medication delivery date: 12/17.     Medication will be delivered via Same Day Courier to the prescription address in Epic WAM.    Antonietta Barcelona   [facility] Specialty Technician

## 2018-12-17 ENCOUNTER — Ambulatory Visit: Payer: Medicaid Other | Admitting: Family Medicine

## 2018-12-17 ENCOUNTER — Encounter: Payer: Self-pay | Admitting: Family Medicine

## 2018-12-17 ENCOUNTER — Other Ambulatory Visit: Payer: Self-pay

## 2018-12-17 VITALS — BP 122/86 | HR 86 | Temp 98.5°F | Resp 12 | Ht 69.0 in | Wt 221.4 lb

## 2018-12-17 DIAGNOSIS — E781 Pure hyperglyceridemia: Secondary | ICD-10-CM | POA: Diagnosis not present

## 2018-12-17 DIAGNOSIS — D75839 Thrombocytosis, unspecified: Secondary | ICD-10-CM

## 2018-12-17 DIAGNOSIS — R7989 Other specified abnormal findings of blood chemistry: Secondary | ICD-10-CM

## 2018-12-17 DIAGNOSIS — Z5181 Encounter for therapeutic drug level monitoring: Secondary | ICD-10-CM

## 2018-12-17 DIAGNOSIS — D473 Essential (hemorrhagic) thrombocythemia: Secondary | ICD-10-CM

## 2018-12-17 DIAGNOSIS — Z23 Encounter for immunization: Secondary | ICD-10-CM | POA: Diagnosis not present

## 2018-12-17 DIAGNOSIS — M791 Myalgia, unspecified site: Secondary | ICD-10-CM | POA: Diagnosis not present

## 2018-12-17 NOTE — Progress Notes (Signed)
Name: Willmon Bathurst   MRN: [id number]    DOB: 1985/01/21   Date:12/17/2018       Progress Note  Chief Complaint  Patient presents with  . Follow-up  . Hyperlipidemia     Subjective:   Mario Boyd is a 33 y.o. male, presents to clinic for routine follow up on the conditions listed above.   Hyperlipidemia and hypertriglyceridemia:  Current Medication Regimen:  lipitor 20 mg  Last Lipids: Lab Results  Component Value Date   CHOL 322 (H) 09/17/2018   HDL 41 09/17/2018   [place]  09/17/2018     Comment:     . LDL cholesterol not calculated. Triglyceride levels greater than 400 mg/dL invalidate calculated LDL results. . Reference range: <100 . Desirable range <100 mg/dL for primary prevention;   <70 mg/dL for patients with CHD or diabetic patients  with > or = 2 CHD risk factors. Marland Kitchen LDL-C is now calculated using the Martin-Hopkins  calculation, which is a validated novel method providing  better accuracy than the Friedewald equation in the  estimation of LDL-C.  Cresenciano Genre et al. Annamaria Helling. [id number]): 2061-2068  (http://education.[facility].com/faq/FAQ164)    TRIG 681 (H) 09/17/2018   CHOLHDL 7.9 (H) 09/17/2018  Lipitor - some stiffness in calves usually after not moving for a short time.  Intermittently occurring, after starting humira and  - Current Diet: healthier, trying to work on cutting back fats and simple sugars and carbs - Denies: Chest pain, shortness of breath, myalgias. - Documented aortic atherosclerosis? 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  He did come to him initially because dermatology encouraged him to get a PCP to manage his elevated triglycerides, lipids etc. so that he would be eligible to take Accutane to treat his skin but since he is having such a good response to the Humira they are going to continue Humira treatment and he will not  start Accutane at this time.  Thrombocytois - per heme/onc, he has been to see them and he does have a follow-up soon to recheck some additional labs     Patient Active Problem List   Diagnosis Date Noted  . Class 1 obesity with serious comorbidity and body mass index (BMI) of 31.0 to 31.9 in adult 09/17/2018  . Elevated LFTs 09/17/2018  . Hypertriglyceridemia 09/17/2018  . History of nephrectomy, right 09/17/2018  . Hidradenitis suppurativa 09/17/2018  . Thrombocytosis ([place]) 09/17/2018    Past Surgical History:  Procedure Laterality Date  . HERNIA REPAIR    . KIDNEY SURGERY    . NEPHRECTOMY LIVING DONOR Right 2009  . TONSILLECTOMY    . TYMPANOSTOMY TUBE PLACEMENT      Family History  Problem Relation Age of Onset  . Hyperlipidemia Mother   . Hypertension Mother   . Other Mother        thrombocytosis  . Hyperlipidemia Father   . Hypertension Father   . Epilepsy Sister   . Hypertension Sister   . Epilepsy Son   . Heart failure Maternal Grandmother   . Diabetes Maternal Grandfather   . Heart failure Maternal Grandfather   . Other Maternal Grandfather        thrombocytosis  . Diabetes Paternal Grandmother   . Hypertension Paternal Grandfather   . Prostate cancer Paternal Grandfather     Social History   Socioeconomic History  . Marital status: Married    Spouse  name: alice  . Number of children: 2  . Years of education: 53  . Highest education level: Associate degree: occupational, Hotel manager, or vocational program  Occupational History  . Occupation: unemployed  Tobacco Use  . Smoking status: Former Smoker    Packs/day: 0.50    Years: 15.00    Pack years: 7.50    Types: Cigarettes  . Smokeless tobacco: Never Used  Substance and Sexual Activity  . Alcohol use: Never  . Drug use: Never  . Sexual activity: Yes    Comment: wife on birth control, 9 years  Other Topics Concern  . Not on file  Social History Narrative   Vape; quit smoking; no alcohol;  works at [employer]; in [place] 2 children.    Social Determinants of Health   Financial Resource Strain: Low Risk   . Difficulty of Paying Living Expenses: Not hard at all  Food Insecurity: No Food Insecurity  . Worried About Charity fundraiser in the Last Year: Never true  . Ran Out of Food in the Last Year: Never true  Transportation Needs: No Transportation Needs  . Lack of Transportation (Medical): No  . Lack of Transportation (Non-Medical): No  Physical Activity: Inactive  . Days of Exercise per Week: 0 days  . Minutes of Exercise per Session: 0 min  Stress: No Stress Concern Present  . Feeling of Stress : Not at all  Social Connections: Moderately Isolated  . Frequency of Communication with Friends and Family: Once a week  . Frequency of Social Gatherings with Friends and Family: Once a week  . Attends Religious Services: Never  . Active Member of Clubs or Organizations: No  . Attends Archivist Meetings: Never  . Marital Status: Married  Human resources officer Violence: Not At Risk  . Fear of Current or Ex-Partner: No  . Emotionally Abused: No  . Physically Abused: No  . Sexually Abused: No     Current Outpatient Medications:  .  Adalimumab 80 MG/0.8ML PNKT, Inject into the skin., Disp: , Rfl:  .  atorvastatin (LIPITOR) 20 MG tablet, TAKE 1 TABLET BY MOUTH EVERY DAY, Disp: 30 tablet, Rfl: 1  No Known Allergies  I personally reviewed active problem list, medication list, allergies, family history, social history, health maintenance, notes from last encounter, lab results, imaging with the patient/caregiver today.  Review of Systems  Constitutional: Negative.   HENT: Negative.   Eyes: Negative.   Respiratory: Negative.   Cardiovascular: Negative.   Gastrointestinal: Negative.   Endocrine: Negative.   Genitourinary: Negative.   Musculoskeletal: Negative.   Skin: Negative.   Allergic/Immunologic: Negative.   Neurological: Negative.   Hematological:  Negative.   Psychiatric/Behavioral: Negative.   All other systems reviewed and are negative.    Objective:    Vitals:   12/17/18 0932  BP: 122/86  Pulse: 86  Resp: 12  Temp: 98.5 F (36.9 C)  SpO2: 97%  Weight: 221 lb 6.4 oz (100.4 kg)  Height: 5\' 9"  (1.753 m)    Body mass index is 32.7 kg/m.  Physical Exam Vitals and nursing note reviewed.  Constitutional:      General: He is not in acute distress.    Appearance: Normal appearance. He is well-developed. He is not ill-appearing, toxic-appearing or diaphoretic.     Comments: Well-appearing healthy male appears stated age  HENT:     Head: Normocephalic and atraumatic.     Nose: Nose normal.  Eyes:     General:  Right eye: No discharge.        Left eye: No discharge.     Conjunctiva/sclera: Conjunctivae normal.  Neck:     Trachea: No tracheal deviation.  Cardiovascular:     Rate and Rhythm: Normal rate and regular rhythm.     Pulses: Normal pulses.     Heart sounds: Normal heart sounds. No murmur. No friction rub. No gallop.   Pulmonary:     Effort: Pulmonary effort is normal. No respiratory distress.     Breath sounds: Normal breath sounds. No stridor. No wheezing.  Abdominal:     General: Bowel sounds are normal. There is no distension.     Palpations: There is no mass.     Tenderness: There is no abdominal tenderness.  Musculoskeletal:        General: Normal range of motion.     Right lower leg: No edema.     Left lower leg: No edema.  Skin:    General: Skin is warm and dry.     Coloration: Skin is not jaundiced or pale.     Findings: No erythema or rash.  Neurological:     Mental Status: He is alert.     Motor: No abnormal muscle tone.     Coordination: Coordination normal.  Psychiatric:        Mood and Affect: Mood normal.        Behavior: Behavior normal.      Recent Results (from the past 2160 hour(s))  Parathyroid hormone,  intact and calcium     Status: Abnormal   Collection Time:  09/27/18 11:48 AM  Result Value Ref Range   PTH 35 15 - 65 pg/mL   Calcium, Total (PTH) 10.5 (H) 8.7 - 10.2 mg/dL   PTH Interp Comment     Comment: (NOTE) Interpretation                 Intact PTH    Calcium                                (pg/mL)      (mg/dL) Normal                          15 - 65     8.6 - 10.2 Primary Hyperparathyroidism         >65          >10.2 Secondary Hyperparathyroidism       >65          <10.2 Non-Parathyroid Hypercalcemia       <65          >10.2 Hypoparathyroidism                  <15          < 8.6 Non-Parathyroid Hypocalcemia    15 - 65          < 8.6 Performed At: [facility] [address] [place], [place] [id number] Rush Farmer MD [id number]   Calcium, ionized     Status: None   Collection Time: 09/27/18 11:48 AM  Result Value Ref Range   Calcium, Ionized, Serum 5.3 4.5 - 5.6 mg/dL    Comment: (NOTE) Performed At: [facility] [address] [place], [place] [id number] Rush Farmer MD [id number]   Basic metabolic panel     Status: None   Collection Time:  09/27/18 11:48 AM  Result Value Ref Range   Sodium 136 135 - 145 mmol/L   Potassium 4.1 3.5 - 5.1 mmol/L   Chloride 101 98 - 111 mmol/L   CO2 24 22 - 32 mmol/L   Glucose, Bld 89 70 - 99 mg/dL   BUN 17 6 - 20 mg/dL   Creatinine, Ser 1.23 0.61 - 1.24 mg/dL   Calcium 9.7 8.9 - 10.3 mg/dL   GFR calc non Af Amer >60 >60 mL/min   GFR calc Af Amer >60 >60 mL/min   Anion gap 11 5 - 15    Comment: Performed at [facility], [address] [place], [place] [address]  JAK2 genotypr     Status: None   Collection Time: 09/27/18 11:48 AM  Result Value Ref Range   JAK2 GenotypR Comment     Comment: (NOTE) Result: NEGATIVE for the JAK2 V617F mutation. Interpretation:  The G to T nucleotide change encoding the V617F mutation was not detected.  This result does not rule out the presence of the JAK2 mutation at a level below the sensitivity of detection of this  assay, or the presence of other mutations within JAK2 not detected by this assay.  This result does not rule out a diagnosis of polycythemia vera, essential thrombocythemia or idiopathic myelofibrosis as the V617F mutation is not detected in all patients with these disorders.    Director Review, JAK2 Comment     Comment: (NOTE) Katina Degree, MD, PhD Director, [facility] [place], [place] [address] [phone] This test was developed and its performance characteristics determined by [facility]. It has not been cleared or approved by the Food and Drug Administration. Performed At: [facility] [address] [place], [place] [id number] Katina Degree MDPhD [id number] Performed At: [facility] [place], [place] [id number] Katina Degree MDPhD [id number]    BACKGROUND: Comment     Comment: (NOTE) JAK2 is a cytoplasmic tyrosine kinase with a key role in signal transduction from multiple hematopoietic growth factor receptors. A point mutation within exon 14 of the JAK2 gene [id number]) encoding a valine to phenylalanine substitution at position 617 of the JAK2 protein (V617F) has been identified in most patients with polycythemia vera, and in about half of those with either essential thrombocythemia or idiopathic myelofibrosis. The V617F has also been detected, although infrequently, in other myeloid disorders such as chronic myelomonocytic leukemia and chronic neutrophilic luekemia. V617F is an acquired mutation that alters a highly conserved valine present in the negative regulatory JH2 domain of the JAK2 protein and is predicted to dysregulate kinase activity. Methodology: Total genomic DNA was extracted and subjected to TaqMan real-time PCR amplification/detection. Two amplification products per sample were monitored by real-time PCR using primers/probes s pecific to JAK2 wild type (WT) and JAK2  mutant V617F. The ABI7900 Absolute Quantitation software will compare the patient specimen valuse to the standard curves and generate percent values for wild type and mutant type. In vitro studies have indicated that this assay has an analytical sensitivity of 1%. References: Baxter EJ, Scott Phineas Real, et al. Acquired mutation of the tyrosine kinase JAK2 in human myeloproliferative disorders. Lancet. 2005 Mar 19-25; 365(9464):1054-1061. Alfonso Ramus Couedic JP. A unique clonal JAK2 mutation leading to constitutive signaling causes polycythaemia vera. Nature. 2005 Apr 28; 434(7037):1144-1148. Kralovics R, Passamonti F, Buser AS, et al. A gain-of-function mutation of JAK2 in myeloproliferative disorders. N Engl  J Med. 2005 Apr 28; 352(17):1779-1790.   MPL mutation analysis     Status: None   Collection Time: 09/27/18 11:48 AM  Result Value Ref Range   MPL Mutation Analysis Result: Comment     Comment: (NOTE) No MPL mutation was identified in the provided specimen of this individual. Results should be interpreted in conjunction with clinical and other laboratory findings for the most accurate interpretation.    BACKGROUND: Comment     Comment: (NOTE) MPL (myeloproliferative leukemia virus oncogene homology) belongs to the hematopoietin superfamily and enables its ligand thrombopoietin to facilitate both global hematopoiesis and megakaryocyte growth and differentiation. MPL W515 mutations are present in patients with primary myelofibrosis (PMF) and essential thrombocythemia (ET) at a frequency of approximately 5% and 1% respectively. The S505 mutation is detected in patients with hereditary thrombocythemia.    METHODOLOGY: Comment     Comment: (NOTE) Genomic DNA was purified from the provided specimen. MPL gene region covering the S505N and W515L/K mutations were subjected to PCR amplification and bi-directional sequencing in duplicate to identify sequence  variations. This assay has a sensitivity to detect approximately 20-25% population of cells containing the MPL mutations in a background of non-mutant cells. This assay will not detect the mutation below the sensitivity of this assay. Molecular- based testing is highly accurate, but as in any laboratory test, rare diagnostic errors may occur.    REFERENCES: Comment     Comment: (NOTE) 1. Pardanani AD, et al. (2006). MPL515 mutations in   myeloproliferative and other myeloid disorders: a study   of 1182 patients. Blood [id number]. 2. Andre Lefort and Levine RL. (2008). JAK2 and MPL   mutations in myeloproliferative neoplasms: discovery and   science. Leukemia 22:1813-1817. 3. Juline Patch, et al. (2009). Evidence for a founder effect   of the MPL-S505N mutation in eight [place] pedigrees with   hereditary thrombocythemia. Haematologica 94(10):1368-   [id number].    DIRECTOR REVIEW: Comment     Comment: (NOTE) Kirby Crigler, PhD, [place] Associate Technical Director, [facility]   [place]) [place], [place],   [place], [place]   [phone] This test was developed and its performance characteristics determined by [facility]. It has not been cleared or approved by the Food and Drug Administration. Performed At: [facility] [place], [place] [id number] Katina Degree MDPhD [id number] Performed At: [facility] [address] [place], [place] [id number] Katina Degree MDPhD [id number]   Calreticulin (CALR) Mutation Analysis     Status: None   Collection Time: 09/27/18 11:48 AM  Result Value Ref Range   CALR Mutation Detection Result Comment     Comment: (NOTE) NEGATIVE No insertions or deletions were detected within the analyzed region of the calreticulin (CALR) gene. A negative result does not entirely exclude  the possibility of a clonal population carrying CALR gene mutations that are not covered by this assay. Results should be interpreted in conjunction with clinical and laboratory findings for the most accurate interpretation.    Background: Comment     Comment: (NOTE) The calcium-binding endoplasmic reticulin chaperone protein, calreticulin (CALR), is somatically mutated in approximately 70% of patients with JAK2-negative essential thrombocythemia (ET) and 60- 88% of patients with JAK2-negative primary myelofibrosis(PMF). Only a minority of patients (approximately 8%) with myelodysplasia have mutations in  CALR gene. CALR mutations are rarely detected in patients with  de novo acute myeloid leukemia, chronic myelogenous leukemia, lymphoid leukemia, or solid tumors. CALR mutations are not detected in polycythemia and generally appear to be mutually exclusive with JAK2 mutations and MPL mutations. The majority of mutational changes involve a variety of insertion or deletion mutations in exon 9 of the calreticulin gene: approximately 53% of all CALR mutations are a 52 bp deletion (type-1) while the second most prevalent mutation (approximately 32%) contains a 5 bp insertion (type-2). Other mutations (non-type 1 or type 2) are seen  in a small minority of cases. CALR mutations in PMF tend to be associated with a favorable prognosis compared to JAK2 V617F mutations, whereas primary myelofibrosis negative for CALR, JAK2 V617F and MPL mutations (so-called triple negative) is associated with a poor prognosis and shorter survival. The detection of a CALR gene mutation aids in the specific diagnosis of a myeloproliferative neoplasm, and help distinguish this clonal disease from a benign reactive process.    Methodology: Comment     Comment: (NOTE) Genomic DNA was isolated from the provided specimen. Polymerase chain reaction (PCR) of exon 9 of the CALR gene was performed with specific  fluorescent-labeled primers, and the PCR product was analyzed by capillary gel electrophoresis to determine the size of the PCR products. This PCR assay is capable of detecting a mutant cell population with a sensitivity of 5 mutant cells per 100 normal cells. A negative result does not exclude the presence of a myeloproliferative disorder or other neoplastic process. This test was developed and its performance characteristics determined by [facility]. It has not been cleared or approved by the Food and Drug Administration. The FDA has determined that such clearance or approval is not necessary.    References: Comment     Comment: (NOTE) 1. Klampfel, T. et al. (2013) Somatic mutations of calreticulin in   myeloproliferative neoplasms. New Engl. J. Med. [id number]. 2. Haynes Kerns et al. (2013) Somatic CALR mutations in   myeloproliferative neoplasms with nonmutated JAK2. New Engl. J.   Med. [phone].    Director Review Comment     Comment: (NOTE) Katina Degree, MD, PhD Director, [facility] [place], [place] [address] [phone] Performed At: [facility] [place], [place] [id number] Katina Degree MDPhD [id number] Performed At: [facility], [place] [id number] Katina Degree MDPhD [id number]   C-reactive protein     Status: Abnormal   Collection Time: 09/27/18 11:48 AM  Result Value Ref Range   CRP 1.8 (H) <1.0 mg/dL    Comment: Performed at [facility], [address], [place], [place] [address]  BCR-ABL1 FISH     Status: None   Collection Time: 09/27/18 11:48 AM  Result Value Ref Range   Specimen Type BLOOD    Cells Counted 200    Cells Analyzed 200    FISH Result Comment:     Comment: NORMAL:  NO BCR OR ABL1 GENE REARRANGEMENT OBSERVED   Interpretation Comment:     Comment: (NOTE)             nuc ish 9q34(ASS1,ABL1)x2,22q11.2(BCRx2)[200].       The fluorescence in situ hybridization (FISH) study was normal. FISH, using unique sequence DNA probes for the ABL1 and BCR gene regions showed two ABL1 signals (red), two control ASS1 gene signals (aqua) located adjacent to the ABL1 locus at 9q34, and two BCR signals (green) at 22q11.2 in all interphase nuclei examined.  There was NO evidence of CML or ALL-associated BCR/ABL1 dual fusion signals in this analysis. .      This analysis is limited to abnormalities detectable by the specific probes included in the study. FISH results should be interpreted within the context of a full cytogenetic analysis and pathology evaluation. .      This test was developed and its performance characteristics determined by [facility]). It has not been cleared or approved by the U.S. Food and Drug Administration. A BCR-ABL1 gene fusion in greater than 3 interphase nuclei in a  patient with a new clinical diagnosis is considered positive. The DNA probe vendor for this study was [employer], research (physical sciences)).    Director Review: Comment:     Comment: (NOTE) Kirby Crigler, PHD, [place] Performed At: [facility] [place], [place] [id number] Katina Degree MDPhD [id number] Performed At: [facility] [address] [place], [place] [id number] Rush Farmer MD [id number]   Lactate dehydrogenase     Status: None   Collection Time: 09/27/18 11:48 AM  Result Value Ref Range   LDH 126 98 - 192 U/L    Comment: Performed at [facility], [address] [place], [place] [address]    Diabetic Foot Exam: Diabetic Foot Exam - Simple   No data filed       PHQ2/9: Depression screen [facility] 2/9 12/17/2018 09/17/2018  Decreased Interest 0 0  Down, Depressed, Hopeless 0 0  PHQ - 2 Score 0 0  Altered sleeping 0 0  Tired, decreased energy 0 0  Change in appetite 0 0  Feeling bad or failure about yourself  0 0  Trouble  concentrating 0 0  Moving slowly or fidgety/restless 0 0  Suicidal thoughts 0 0  PHQ-9 Score 0 0  Difficult doing work/chores Not difficult at all Not difficult at all    phq 9 is negative Reviewed today  Fall Risk: Fall Risk  12/17/2018 09/17/2018  Falls in the past year? 0 0  Number falls in past yr: 0 0  Injury with Fall? 0 0      Functional Status Survey: Is the patient deaf or have difficulty hearing?: No Does the patient have difficulty seeing, even when wearing glasses/contacts?: Yes Does the patient have difficulty concentrating, remembering, or making decisions?: No Does the patient have difficulty walking or climbing stairs?: No Does the patient have difficulty dressing or bathing?: No Does the patient have difficulty doing errands alone such as visiting a doctor's office or shopping?: No    Assessment & Plan:    1. Hypertriglyceridemia Patient is tolerating medications recheck his labs, will see if he needs dose increased to better control cholesterol and triglycerides encouraged continued dietary efforts to decrease simple sugars, carbs and avoid EtOH until triglycerides are in a much better range - CMP w GFR - Lipid Panel  2. Elevated LFTs Recheck labs - CMP w GFR  3. Encounter for medication monitoring - CMP w GFR - Lipid Panel - CBC w/ Diff  4. Thrombocytosis ([place]) Now established with hematology who do suspect some bone marrow dysfunction due to long history of thrombocytosis he does have follow-up in a few months - CBC w/ Diff  5. Need for Tdap vaccination - Tdap vaccine greater than or equal to 7yo IM   Send through 90 d refill after labs   Return in about 6 months (around 06/17/2019) for Routine follow-up, Annual Physical.   Delsa Grana,  PA-C 12/17/18 10:02 AM

## 2018-12-21 ENCOUNTER — Encounter: Payer: Self-pay | Admitting: Family Medicine

## 2018-12-21 ENCOUNTER — Telehealth: Payer: Self-pay

## 2018-12-21 ENCOUNTER — Other Ambulatory Visit: Payer: Self-pay | Admitting: Family Medicine

## 2018-12-21 DIAGNOSIS — D72829 Elevated white blood cell count, unspecified: Secondary | ICD-10-CM

## 2018-12-21 DIAGNOSIS — R7989 Other specified abnormal findings of blood chemistry: Secondary | ICD-10-CM

## 2018-12-21 DIAGNOSIS — M791 Myalgia, unspecified site: Secondary | ICD-10-CM

## 2018-12-21 DIAGNOSIS — Z79899 Other long term (current) drug therapy: Secondary | ICD-10-CM

## 2018-12-21 NOTE — Telephone Encounter (Signed)
-----   Message from Delsa Grana, [place] sent at 12/20/2018  5:30 PM EST ----- Roselyn Reef please call pt and ensure that he holds the lipitor for now  Please add on CK to his blood work if able, for dx elevated LFTs and dx of myalgias  Please ask pt if he has been sick with anything lately?

## 2018-12-21 NOTE — Unmapped (Signed)
Reginald Conway is having [facility] send over labs,but he did include some screen shots to see if you have any ideas.

## 2018-12-22 ENCOUNTER — Other Ambulatory Visit: Payer: Self-pay

## 2018-12-22 ENCOUNTER — Ambulatory Visit
Admission: RE | Admit: 2018-12-22 | Discharge: 2018-12-22 | Disposition: A | Discharge status: Home / Self Care | Payer: Medicaid Other | Source: Ambulatory Visit | Attending: Family Medicine | Admitting: Family Medicine

## 2018-12-22 ENCOUNTER — Ambulatory Visit: Payer: Medicaid Other | Admitting: Family Medicine

## 2018-12-22 ENCOUNTER — Ambulatory Visit
Admission: RE | Admit: 2018-12-22 | Discharge: 2018-12-22 | Disposition: A | Discharge status: Home / Self Care | Payer: Medicaid Other | Attending: Family Medicine | Admitting: Family Medicine

## 2018-12-22 ENCOUNTER — Encounter: Payer: Self-pay | Admitting: Family Medicine

## 2018-12-22 VITALS — BP 118/84 | HR 93 | Temp 98.5°F | Resp 14 | Ht 68.0 in | Wt 220.1 lb

## 2018-12-22 DIAGNOSIS — R7989 Other specified abnormal findings of blood chemistry: Secondary | ICD-10-CM

## 2018-12-22 DIAGNOSIS — D72829 Elevated white blood cell count, unspecified: Secondary | ICD-10-CM | POA: Diagnosis not present

## 2018-12-22 DIAGNOSIS — D84821 Immunodeficiency due to drugs: Secondary | ICD-10-CM | POA: Diagnosis not present

## 2018-12-22 DIAGNOSIS — D473 Essential (hemorrhagic) thrombocythemia: Secondary | ICD-10-CM

## 2018-12-22 DIAGNOSIS — R590 Localized enlarged lymph nodes: Secondary | ICD-10-CM

## 2018-12-22 DIAGNOSIS — Z79899 Other long term (current) drug therapy: Secondary | ICD-10-CM | POA: Insufficient documentation

## 2018-12-22 DIAGNOSIS — D75839 Thrombocytosis, unspecified: Secondary | ICD-10-CM

## 2018-12-22 DIAGNOSIS — J324 Chronic pansinusitis: Secondary | ICD-10-CM | POA: Diagnosis not present

## 2018-12-22 LAB — COMPLETE METABOLIC PANEL WITH GFR
AG Ratio: 1.5 (calc) (ref 1.0–2.5)
ALT: 165 U/L — ABNORMAL HIGH (ref 9–46)
AST: 57 U/L — ABNORMAL HIGH (ref 10–40)
Albumin: 4.3 g/dL (ref 3.6–5.1)
Alkaline phosphatase (APISO): 91 U/L (ref 36–130)
BUN: 18 mg/dL (ref 7–25)
CO2: 23 mmol/L (ref 20–32)
Calcium: 9.9 mg/dL (ref 8.6–10.3)
Chloride: 104 mmol/L (ref 98–110)
Creat: 1.12 mg/dL (ref 0.60–1.35)
GFR, Est African American: 98 mL/min/{1.73_m2} (ref >=60)
GFR, Est Non African American: 85 mL/min/{1.73_m2} (ref >=60)
Globulin: 2.8 g/dL (calc) (ref 1.9–3.7)
Glucose, Bld: 69 mg/dL (ref 65–99)
Potassium: 4.7 mmol/L (ref 3.5–5.3)
Sodium: 141 mmol/L (ref 135–146)
Total Bilirubin: 0.4 mg/dL (ref 0.2–1.2)
Total Protein: 7.1 g/dL (ref 6.1–8.1)

## 2018-12-22 LAB — CBC WITH DIFFERENTIAL/PLATELET
Absolute Monocytes: 1762 cells/uL — ABNORMAL HIGH (ref 200–950)
Basophils Absolute: 158 cells/uL (ref 0–200)
Basophils Relative: 0.8 %
Eosinophils Absolute: 178 cells/uL (ref 15–500)
Eosinophils Relative: 0.9 %
HCT: 49.8 % (ref 38.5–50.0)
Hemoglobin: 17.2 g/dL — ABNORMAL HIGH (ref 13.2–17.1)
Lymphs Abs: 8771 cells/uL — ABNORMAL HIGH (ref 850–3900)
MCH: 31 pg (ref 27.0–33.0)
MCHC: 34.5 g/dL (ref 32.0–36.0)
MCV: 89.9 fL (ref 80.0–100.0)
MPV: 9.5 fL (ref 7.5–12.5)
Monocytes Relative: 8.9 %
Neutro Abs: 8930 cells/uL — ABNORMAL HIGH (ref 1500–7800)
Neutrophils Relative %: 45.1 %
Platelets: 518 10*3/uL — ABNORMAL HIGH (ref 140–400)
RBC: 5.54 10*6/uL (ref 4.20–5.80)
RDW: 12.7 % (ref 11.0–15.0)
Total Lymphocyte: 44.3 %
WBC: 19.8 10*3/uL — ABNORMAL HIGH (ref 3.8–10.8)

## 2018-12-22 LAB — TEST AUTHORIZATION

## 2018-12-22 LAB — LIPID PANEL
Cholesterol: 178 mg/dL (ref <200)
HDL: 60 mg/dL (ref >=40)
LDL Cholesterol (Calc): 80 mg/dL (calc)
Non-HDL Cholesterol (Calc): 118 mg/dL (calc) (ref <130)
Total CHOL/HDL Ratio: 3 (calc) (ref <5.0)
Triglycerides: 316 mg/dL — ABNORMAL HIGH (ref <150)

## 2018-12-22 LAB — CK: Total CK: 33 U/L — ABNORMAL LOW (ref 44–196)

## 2018-12-22 MED ORDER — AMOXICILLIN-POT CLAVULANATE 875-125 MG PO TABS: 1.0000 | ORAL_TABLET | Freq: Two times a day (BID) | ORAL | 0 refills | Status: AC

## 2018-12-22 NOTE — Progress Notes (Deleted)
Name: Mario Boyd   MRN: [id number]    DOB: [birth year]   Date:12/22/2018       Progress Note  Subjective:    Chief Complaint  Chief Complaint  Patient presents with  . Follow-up    abnormal labs    I connected with  [name]  on 12/22/18 at  1:40 PM EST by a video enabled telemedicine application and verified that I am speaking with the correct person using two identifiers.  I discussed the limitations of evaluation and management by telemedicine and the availability of in person appointments. The patient expressed understanding and agreed to proceed. Staff also discussed with the patient that there may be a patient responsible charge related to this service. 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 Class 1 obesity with serious comorbidity and body mass index (BMI) of 31.0 to 31.9 in adult 09/17/2018  . Elevated LFTs 09/17/2018  . Hypertriglyceridemia 09/17/2018  . History of nephrectomy, right 09/17/2018  . Hidradenitis suppurativa 09/17/2018  . Thrombocytosis ([place]) 09/17/2018    Social History   Tobacco Use  . Smoking status: Former Smoker    Packs/day: 0.50    Years: 15.00    Pack years: 7.50    Types:  Cigarettes  . Smokeless tobacco: Never Used  Substance Use Topics  . Alcohol use: Never  stopped smoking in July 2020   Current Outpatient Medications:  .  Adalimumab 80 MG/0.8ML PNKT, Inject into the skin., Disp: , Rfl:  .  atorvastatin (LIPITOR) 20 MG tablet, TAKE 1 TABLET BY MOUTH EVERY DAY, Disp: 30 tablet, Rfl: 1  No Known Allergies  I personally reviewed {Reviewed:14835} with the patient/caregiver today.  ROS   Objective:   Virtual encounter, vitals limited, only able to obtain the following Today's Vitals   12/22/18 1344  BP: 118/84  Pulse: 93  Resp: 14  Temp: 98.5 F (36.9 C)  SpO2: 94%  Weight: 220 lb 1.6 oz (99.8 kg)  Height: 5\' 8"  (1.727 m)   Body mass index is 33.47 kg/m. Nursing Note and Vital Signs reviewed.  Physical Exam @PHYSEXAM @ PE limited by telephone encounter  No results found for this or any previous visit (from the past 72 hour(s)).  Assessment and Plan:   No diagnosis found.   -Red flags and when to present for emergency care or RTC including fever >101.81F, chest pain, shortness of breath, new/worsening/un-resolving symptoms, *** reviewed with patient at time of visit. Follow up and care instructions discussed and provided in AVS. - I discussed the assessment and treatment plan with the patient. The patient was provided an opportunity to ask questions and all were answered.  The patient agreed with the plan and demonstrated an understanding of the instructions.  I provided *** minutes of non-face-to-face time during this encounter.  Delsa Grana, PA-C 12/22/18 2:10 PM

## 2018-12-22 NOTE — Patient Instructions (Signed)
We will call you back with labs and the plan hopefully tomorrow  Take the antibiotics for sinus infection.  If your lymphnodes continue to swell up and be tender - and this lasts more than 2-4 weeks, I need to see you again to follow up on them.

## 2018-12-23 LAB — URINALYSIS, ROUTINE W REFLEX MICROSCOPIC
Bacteria, UA: NONE SEEN /HPF
Bilirubin Urine: NEGATIVE
Glucose, UA: NEGATIVE
Hgb urine dipstick: NEGATIVE
Hyaline Cast: NONE SEEN /LPF
Ketones, ur: NEGATIVE
Leukocytes,Ua: NEGATIVE
Nitrite: NEGATIVE
Specific Gravity, Urine: 1.023 (ref 1.001–1.03)
Squamous Epithelial / HPF: NONE SEEN /HPF (ref <=5)
WBC, UA: NONE SEEN /HPF (ref 0–5)
pH: 5.5 (ref 5.0–8.0)

## 2018-12-23 LAB — COMPLETE METABOLIC PANEL WITH GFR
AG Ratio: 1.6 (calc) (ref 1.0–2.5)
ALT: 134 U/L — ABNORMAL HIGH (ref 9–46)
AST: 35 U/L (ref 10–40)
Albumin: 4.5 g/dL (ref 3.6–5.1)
Alkaline phosphatase (APISO): 94 U/L (ref 36–130)
BUN: 15 mg/dL (ref 7–25)
CO2: 28 mmol/L (ref 20–32)
Calcium: 11.4 mg/dL — ABNORMAL HIGH (ref 8.6–10.3)
Chloride: 102 mmol/L (ref 98–110)
Creat: 1.08 mg/dL (ref 0.60–1.35)
GFR, Est African American: 103 mL/min/{1.73_m2} (ref >=60)
GFR, Est Non African American: 88 mL/min/{1.73_m2} (ref >=60)
Globulin: 2.9 g/dL (calc) (ref 1.9–3.7)
Glucose, Bld: 73 mg/dL (ref 65–99)
Potassium: 4.6 mmol/L (ref 3.5–5.3)
Sodium: 141 mmol/L (ref 135–146)
Total Bilirubin: 0.3 mg/dL (ref 0.2–1.2)
Total Protein: 7.4 g/dL (ref 6.1–8.1)

## 2018-12-23 LAB — EPSTEIN-BARR VIRUS VCA ANTIBODY PANEL
EBV NA IgG: 65.5 U/mL — ABNORMAL HIGH
EBV VCA IgG: >750 U/mL — ABNORMAL HIGH
EBV VCA IgM: <36 U/mL

## 2018-12-23 LAB — CBC (INCLUDES DIFF/PLT) WITH PATHOLOGIST REVIEW
Absolute Monocytes: 1155 cells/uL — ABNORMAL HIGH (ref 200–950)
Basophils Absolute: 135 cells/uL (ref 0–200)
Basophils Relative: 0.9 %
Eosinophils Absolute: 255 cells/uL (ref 15–500)
Eosinophils Relative: 1.7 %
HCT: 50 % (ref 38.5–50.0)
Hemoglobin: 17.4 g/dL — ABNORMAL HIGH (ref 13.2–17.1)
Lymphs Abs: 5940 cells/uL — ABNORMAL HIGH (ref 850–3900)
MCH: 31.4 pg (ref 27.0–33.0)
MCHC: 34.8 g/dL (ref 32.0–36.0)
MCV: 90.1 fL (ref 80.0–100.0)
MPV: 9.4 fL (ref 7.5–12.5)
Monocytes Relative: 7.7 %
Neutro Abs: 7515 cells/uL (ref 1500–7800)
Neutrophils Relative %: 50.1 %
Platelets: 472 10*3/uL — ABNORMAL HIGH (ref 140–400)
RBC: 5.55 10*6/uL (ref 4.20–5.80)
RDW: 13.1 % (ref 11.0–15.0)
Total Lymphocyte: 39.6 %
WBC: 15 10*3/uL — ABNORMAL HIGH (ref 3.8–10.8)

## 2018-12-23 LAB — PROTIME-INR
INR: 1
Prothrombin Time: 10.2 s (ref 9.0–11.5)

## 2018-12-23 LAB — URINE CULTURE
MICRO NUMBER:: 1205069
Result:: NO GROWTH
SPECIMEN QUALITY:: ADEQUATE

## 2018-12-23 LAB — SEDIMENTATION RATE: Sed Rate: 2 mm/h (ref 0–15)

## 2018-12-23 LAB — C-REACTIVE PROTEIN: CRP: 1.4 mg/L (ref <8.0)

## 2018-12-23 LAB — APTT: aPTT: 26 s (ref 23–32)

## 2018-12-23 MED FILL — HUMIRA PEN CITRATE FREE 40 MG/0.4 ML: 28 days supply | Qty: 4 | Fill #2 | Status: AC

## 2018-12-23 MED FILL — HUMIRA PEN CITRATE FREE 40 MG/0.4 ML: SUBCUTANEOUS | 28 days supply | Qty: 4 | Fill #2

## 2018-12-28 NOTE — Progress Notes (Signed)
Name: Mario Boyd   MRN: [id number]    DOB: [birth year]   Date:12/28/2018       Progress Note  Chief Complaint  Patient presents with  . Follow-up    abnormal labs     Subjective:   Mario Boyd is a 33 y.o. male, presents to clinic for routine follow up on the conditions listed above.  Patient returned last week for follow-up on LFTs after starting a statin medication for high triglycerides and high cholesterol so he could improve his labs and begin taking Accutane for dissecting cellulitis.  Lab work was pertinent for elevated LFTs, leukocytosis, erythrocytosis.  He is here for follow-up after consulting with hematology and Dr. Ancil Boozer - to work up for possible source of infection or inflammation, recently started Humira from dermatology.  CK was added on to labs and was normal.  Was having him hold his statin pending CK level, but normal so we will have him resume since lipids responded so well to lipitor.    Patient denies any cough, wheeze, chest pain, rashes, headache, possibility of STDs, urinary symptoms, abdominal pain nausea vomiting.  He reports over the past couple months that his calves have been achy and last appointment he did state that he had some joint pain but he denies any generalized myalgias.  He does have a history of sweats and night sweats have increased slightly he has not had any unintentional weight loss, denies hemoptysis.  He states that he has chronic sinusitis and over the past couple months and weeks has had intermittent lymph nodes that are swelling to his neck and under his jawline they come and go.  He has thought that they are related to the lesions on his scalp.  He has continued to have some cervical lymphadenopathy even recently despite his improved scalp lesions after starting Humira.  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  The patient and none of his family members were ever assessed or treated based on these recommendations.  Last kenalog injection into scalp lesions was the end of November   For a long time he has felt like his glands in his neck, his lymph nodes swelling off and on every several days, feels like strep throat when it occurs, right side worse than left, happening over the past year.  They did tend to correlate with his dissecting cellulitis  No known tick bites, no past STDs, no past IV drug use, recent international travel, exposure to anyone with known illness, no international travel.  Work-up done by hematology did show mildly elevated CRP but negative sed rate  Patient Active Problem List   Diagnosis Date Noted  . Class 1 obesity with serious comorbidity and body mass index (BMI) of 31.0 to 31.9 in adult 09/17/2018  . Elevated LFTs 09/17/2018  . Hypertriglyceridemia 09/17/2018  . History of nephrectomy, right 09/17/2018  . Hidradenitis suppurativa 09/17/2018  . Thrombocytosis ([place]) 09/17/2018    Past Surgical History:  Procedure Laterality Date  . HERNIA REPAIR    . KIDNEY SURGERY    . NEPHRECTOMY LIVING DONOR Right 2009  . TONSILLECTOMY    . TYMPANOSTOMY TUBE PLACEMENT      Family History  Problem Relation Age of Onset  . Hyperlipidemia Mother   . Hypertension Mother   . Other Mother        thrombocytosis  . Hyperlipidemia Father   . Hypertension Father   . Epilepsy Sister   . Hypertension Sister   . Epilepsy Son   . Heart failure Maternal Grandmother   . Diabetes Maternal Grandfather   . Heart failure Maternal Grandfather   . Other Maternal Grandfather        thrombocytosis  . Diabetes Paternal Grandmother   . Hypertension Paternal Grandfather   . Prostate cancer Paternal Grandfather     Social  History   Socioeconomic History  . Marital status: Married    Spouse name: alice  . Number of children: 2  . Years of education: 33  . Highest education level: Associate degree: occupational, Hotel manager, or vocational program  Occupational History  . Occupation: unemployed  Tobacco Use  . Smoking status: Former Smoker    Packs/day: 0.50    Years: 15.00    Pack years: 7.50    Types: Cigarettes  . Smokeless tobacco: Never Used  Substance and Sexual Activity  . Alcohol use: Never  . Drug use: Never  . Sexual activity: Yes    Comment: wife on birth control, 9 years  Other Topics Concern  . Not on file  Social History Narrative   Vape; quit smoking; no alcohol; works at [employer]; in [place] 2 children.    Social Determinants of Health   Financial Resource Strain: Low Risk   . Difficulty of Paying Living Expenses: Not hard at all  Food Insecurity: No Food Insecurity  . Worried About Charity fundraiser in the Last Year: Never true  . Ran Out of Food in the Last Year: Never true  Transportation Needs: No Transportation Needs  . Lack of Transportation (Medical): No  . Lack of Transportation (Non-Medical): No  Physical Activity: Inactive  . Days of Exercise per Week: 0 days  . Minutes of Exercise per Session: 0 min  Stress: No Stress Concern Present  . Feeling of Stress : Not at all  Social Connections: Moderately Isolated  . Frequency of Communication with Friends and Family: Once a week  . Frequency of Social Gatherings with Friends and Family: Once a week  . Attends Religious Services: Never  . Active Member of Clubs or Organizations: No  . Attends Archivist Meetings: Never  . Marital Status: Married  Human resources officer Violence: Not At Risk  . Fear of Current or Ex-Partner: No  . Emotionally Abused: No  . Physically Abused: No  . Sexually Abused: No     Current Outpatient Medications:  .  Adalimumab 80 MG/0.8ML PNKT, Inject into the skin., Disp: ,  Rfl:  .  amoxicillin-clavulanate (AUGMENTIN) 875-125 MG tablet, Take 1 tablet by mouth 2 (two) times daily for 10 days., Disp: 20 tablet, Rfl: 0 .  atorvastatin (LIPITOR) 20 MG tablet, TAKE 1 TABLET BY MOUTH EVERY DAY, Disp: 30 tablet, Rfl: 1  No Known Allergies  I personally reviewed active problem list, medication list, allergies, family history, social history, health maintenance, notes from last encounter, lab results, imaging with the patient/caregiver today.   Review of Systems  Constitutional: Positive for diaphoresis. Negative for activity change, appetite change, chills, fatigue, fever and unexpected weight change.  HENT: Positive for congestion, drooling, postnasal drip and sinus pressure. Negative for dental problem, ear discharge, ear pain, facial swelling, hearing loss, mouth sores, rhinorrhea, sinus pain,  sneezing, sore throat, tinnitus and trouble swallowing.   Eyes: Negative.  Negative for photophobia, pain, discharge, redness, itching and visual disturbance.  Respiratory: Negative.  Negative for apnea, cough, choking, chest tightness, shortness of breath, wheezing and stridor.   Cardiovascular: Negative.  Negative for chest pain, palpitations and leg swelling.  Gastrointestinal: Negative.  Negative for abdominal pain, blood in stool, constipation, diarrhea, nausea and vomiting.  Endocrine: Negative.  Negative for polydipsia, polyphagia and polyuria.  Genitourinary: Negative.  Negative for decreased urine volume, difficulty urinating, testicular pain and urgency.  Musculoskeletal: Positive for arthralgias and myalgias. Negative for back pain, gait problem, joint swelling, neck pain and neck stiffness.  Skin: Negative.  Negative for color change, pallor, rash and wound.  Allergic/Immunologic: Positive for immunocompromised state.  Neurological: Negative.  Negative for dizziness, syncope, weakness, light-headedness and numbness.  Hematological: Positive for adenopathy.   Psychiatric/Behavioral: Negative.  Negative for agitation, behavioral problems, confusion, dysphoric mood, self-injury, sleep disturbance and suicidal ideas. The patient is not nervous/anxious.   All other systems reviewed and are negative.    Objective:    Vitals:   12/22/18 1344  BP: 118/84  Pulse: 93  Resp: 14  Temp: 98.5 F (36.9 C)  SpO2: 94%  Weight: 220 lb 1.6 oz (99.8 kg)  Height: 5\' 8"  (1.727 m)    Body mass index is 33.47 kg/m.  Physical Exam Vitals and nursing note reviewed.  Constitutional:      General: He is not in acute distress.    Appearance: Normal appearance. He is well-developed. He is diaphoretic. He is not ill-appearing (mildly) or toxic-appearing.  HENT:     Head: Normocephalic and atraumatic.     Jaw: No trismus.     Right Ear: Tympanic membrane, ear canal and external ear normal.     Left Ear: Tympanic membrane, ear canal and external ear normal.     Nose: Mucosal edema, congestion and rhinorrhea present.     Right Nostril: No epistaxis.     Left Nostril: No epistaxis.     Right Turbinates: Enlarged and swollen.     Left Turbinates: Enlarged and swollen.     Right Sinus: No maxillary sinus tenderness or frontal sinus tenderness.     Left Sinus: No maxillary sinus tenderness or frontal sinus tenderness.     Comments: Diffusely erythematous and edematous nasal mucosa    Mouth/Throat:     Mouth: Mucous membranes are moist. Mucous membranes are not pale, not dry and not cyanotic.     Pharynx: Oropharynx is clear. Uvula midline. Posterior oropharyngeal erythema present. No oropharyngeal exudate or uvula swelling.     Tonsils: No tonsillar exudate or tonsillar abscesses.  Eyes:     General: Lids are normal.        Right eye: No discharge.        Left eye: No discharge.     Conjunctiva/sclera: Conjunctivae normal.     Pupils: Pupils are equal, round, and reactive to light.  Neck:     Trachea: Trachea and phonation normal. No tracheal deviation.   Cardiovascular:     Rate and Rhythm: Normal rate and regular rhythm.     Pulses: Normal pulses.          Radial pulses are 2+ on the right side and 2+ on the left side.       Posterior tibial pulses are 2+ on the right side and 2+ on the left side.     Heart sounds: Normal heart sounds. No  murmur. No friction rub. No gallop.   Pulmonary:     Effort: Pulmonary effort is normal. No tachypnea, accessory muscle usage or respiratory distress.     Breath sounds: Normal breath sounds. No stridor. No decreased breath sounds, wheezing, rhonchi or rales.  Abdominal:     General: Bowel sounds are normal. There is no distension.     Palpations: Abdomen is soft.     Tenderness: There is no abdominal tenderness. There is no guarding or rebound.  Musculoskeletal:        General: Normal range of motion.     Cervical back: Normal range of motion and neck supple.  Lymphadenopathy:     Head:     Right side of head: Submental and submandibular adenopathy present.     Left side of head: Submental and submandibular adenopathy present.     Cervical: Cervical adenopathy (mild proximal) present.     Upper Body:     Right upper body: No supraclavicular, axillary or pectoral adenopathy.     Left upper body: No supraclavicular, axillary or pectoral adenopathy.  Skin:    General: Skin is warm.     Capillary Refill: Capillary refill takes less than 2 seconds.     Coloration: Skin is not pale.     Findings: No rash.     Nails: There is no clubbing.  Neurological:     Mental Status: He is alert and oriented to person, place, and time.     Motor: No abnormal muscle tone.     Coordination: Coordination normal.     Gait: Gait normal.  Psychiatric:        Speech: Speech normal.        Behavior: Behavior normal. Behavior is cooperative.      Recent Results (from the past 2160 hour(s))  CMP w GFR     Status: Abnormal   Collection Time: 12/17/18 12:00 AM  Result Value Ref Range   Glucose, Bld 69 65 - 99 mg/dL     Comment: .            Fasting reference interval .    BUN 18 7 - 25 mg/dL   Creat 1.12 0.60 - 1.35 mg/dL   GFR, Est Non African American 85 > OR = 60 mL/min/1.39m2   GFR, Est African American 98 > OR = 60 mL/min/1.77m2   BUN/Creatinine Ratio NOT APPLICABLE 6 - 22 (calc)   Sodium 141 135 - 146 mmol/L   Potassium 4.7 3.5 - 5.3 mmol/L   Chloride 104 98 - 110 mmol/L   CO2 23 20 - 32 mmol/L   Calcium 9.9 8.6 - 10.3 mg/dL   Total Protein 7.1 6.1 - 8.1 g/dL   Albumin 4.3 3.6 - 5.1 g/dL   Globulin 2.8 1.9 - 3.7 g/dL (calc)   AG Ratio 1.5 1.0 - 2.5 (calc)   Total Bilirubin 0.4 0.2 - 1.2 mg/dL   Alkaline phosphatase (APISO) 91 36 - 130 U/L   AST 57 (H) 10 - 40 U/L   ALT 165 (H) 9 - 46 U/L  Lipid Panel     Status: Abnormal   Collection Time: 12/17/18 12:00 AM  Result Value Ref Range   Cholesterol 178 <200 mg/dL   HDL 60 > OR = 40 mg/dL   Triglycerides 316 (H) <150 mg/dL    Comment: . If a non-fasting specimen was collected, consider repeat triglyceride testing on a fasting specimen if clinically indicated.  Yates Decamp et al. J. of Clin. Lipidol.  [id number]. Marland Kitchen    LDL Cholesterol (Calc) 80 mg/dL (calc)    Comment: Reference range: <100 . Desirable range <100 mg/dL for primary prevention;   <70 mg/dL for patients with CHD or diabetic patients  with > or = 2 CHD risk factors. Marland Kitchen LDL-C is now calculated using the Martin-Hopkins  calculation, which is a validated novel method providing  better accuracy than the Friedewald equation in the  estimation of LDL-C.  Cresenciano Genre et al. Annamaria Helling. [id number]): [id number]  (http://education.[facility].com/faq/FAQ164)    Total CHOL/HDL Ratio 3.0 <5.0 (calc)   Non-HDL Cholesterol (Calc) 118 <130 mg/dL (calc)    Comment: For patients with diabetes plus 1 major ASCVD risk  factor, treating to a non-HDL-C goal of <100 mg/dL  (LDL-C of <70 mg/dL) is considered a therapeutic  option.   CBC w/ Diff     Status: Abnormal   Collection Time:  12/17/18 12:00 AM  Result Value Ref Range   WBC 19.8 (H) 3.8 - 10.8 Thousand/uL   RBC 5.54 4.20 - 5.80 Million/uL   Hemoglobin 17.2 (H) 13.2 - 17.1 g/dL   HCT 49.8 38.5 - 50.0 %   MCV 89.9 80.0 - 100.0 fL   MCH 31.0 27.0 - 33.0 pg   MCHC 34.5 32.0 - 36.0 g/dL   RDW 12.7 11.0 - 15.0 %   Platelets 518 (H) 140 - 400 Thousand/uL   MPV 9.5 7.5 - 12.5 fL   Neutro Abs 8,930 (H) 1,500 - 7,800 cells/uL   Lymphs Abs 8,771 (H) 850 - 3,900 cells/uL   Absolute Monocytes 1,762 (H) 200 - 950 cells/uL   Eosinophils Absolute 178 15 - 500 cells/uL   Basophils Absolute 158 0 - 200 cells/uL   Neutrophils Relative % 45.1 %   Total Lymphocyte 44.3 %   Monocytes Relative 8.9 %   Eosinophils Relative 0.9 %   Basophils Relative 0.8 %  CK     Status: Abnormal   Collection Time: 12/17/18 12:00 AM  Result Value Ref Range   Total CK 33 (L) 44 - 196 U/L  TEST AUTHORIZATION     Status: None   Collection Time: 12/17/18 12:00 AM  Result Value Ref Range   TEST NAME: CREATINE KINASE, TOTAL    TEST CODE: [id number]    CLIENT CONTACT: Rockwell [place]    REPORT ALWAYS MESSAGE SIGNATURE      Comment: . The laboratory testing on this patient was verbally requested or confirmed by the ordering physician or his or her authorized representative after contact with an employee of [employer]. Federal regulations require that we maintain on file written authorization for all laboratory testing.  Accordingly we are asking that the ordering physician or his or her authorized representative sign a copy of this report and promptly return it to the client service representative. . . Signature:____________________________________________________ . Please fax this signed page to [phone] or return it via your [employer] courier.   CMP w GFR     Status: Abnormal   Collection Time: 12/22/18 12:00 AM  Result Value Ref Range   Glucose, Bld 73 65 - 99 mg/dL    Comment: .            Fasting reference  interval .    BUN 15 7 - 25 mg/dL   Creat 1.08 0.60 - 1.35 mg/dL   GFR, Est Non African American 88 > OR = 60 mL/min/1.69m2   GFR, Est African American 103 > OR = 60 mL/min/1.72m2   BUN/Creatinine Ratio  NOT APPLICABLE 6 - 22 (calc)   Sodium 141 135 - 146 mmol/L   Potassium 4.6 3.5 - 5.3 mmol/L   Chloride 102 98 - 110 mmol/L   CO2 28 20 - 32 mmol/L   Calcium 11.4 (H) 8.6 - 10.3 mg/dL   Total Protein 7.4 6.1 - 8.1 g/dL   Albumin 4.5 3.6 - 5.1 g/dL   Globulin 2.9 1.9 - 3.7 g/dL (calc)   AG Ratio 1.6 1.0 - 2.5 (calc)   Total Bilirubin 0.3 0.2 - 1.2 mg/dL   Alkaline phosphatase (APISO) 94 36 - 130 U/L   AST 35 10 - 40 U/L   ALT 134 (H) 9 - 46 U/L  CBC +diff and smear     Status: Abnormal   Collection Time: 12/22/18 12:00 AM  Result Value Ref Range   WBC 15.0 (H) 3.8 - 10.8 Thousand/uL   RBC 5.55 4.20 - 5.80 Million/uL   Hemoglobin 17.4 (H) 13.2 - 17.1 g/dL   HCT 50.0 38.5 - 50.0 %   MCV 90.1 80.0 - 100.0 fL   MCH 31.4 27.0 - 33.0 pg   MCHC 34.8 32.0 - 36.0 g/dL   RDW 13.1 11.0 - 15.0 %   Platelets 472 (H) 140 - 400 Thousand/uL   MPV 9.4 7.5 - 12.5 fL   Neutro Abs 7,515 1,500 - 7,800 cells/uL   Lymphs Abs 5,940 (H) 850 - 3,900 cells/uL   Absolute Monocytes 1,155 (H) 200 - 950 cells/uL   Eosinophils Absolute 255 15 - 500 cells/uL   Basophils Absolute 135 0 - 200 cells/uL   Neutrophils Relative % 50.1 %   Total Lymphocyte 39.6 %   Monocytes Relative 7.7 %   Eosinophils Relative 1.7 %   Basophils Relative 0.9 %   Comment      Comment: Leukocytosis due to absolute lymphocytosis. Myeloid population consists predominantly of mature segmented neutrophils with reactive changes. A few lymphocytes appear reactive. No immature cells are identified. RBC are unremarkable. The overall findings in this smear are consistent with a reactive thrombocytosis. Clinical correlation is recommended. Reviewed by Francis Gaines Mammarappallil, MD  (Electronic Signature on File)     12/23/2018    Sed Rate     Status: None   Collection Time: 12/22/18 12:00 AM  Result Value Ref Range   Sed Rate 2 0 - 15 mm/h  CRP     Status: None   Collection Time: 12/22/18 12:00 AM  Result Value Ref Range   CRP 1.4 <8.0 mg/L  Urinalysis, Routine w reflex microscopic     Status: Abnormal   Collection Time: 12/22/18 12:00 AM  Result Value Ref Range   Color, Urine YELLOW YELLOW   APPearance CLEAR CLEAR   Specific Gravity, Urine 1.023 1.001 - 1.03   pH 5.5 5.0 - 8.0   Glucose, UA NEGATIVE NEGATIVE   Bilirubin Urine NEGATIVE NEGATIVE   Ketones, ur NEGATIVE NEGATIVE   Hgb urine dipstick NEGATIVE NEGATIVE   Protein, ur 1+ (A) NEGATIVE   Nitrite NEGATIVE NEGATIVE   Leukocytes,Ua NEGATIVE NEGATIVE   WBC, UA NONE SEEN 0 - 5 /HPF   RBC / HPF 0-2 0 - 2 /HPF   Squamous Epithelial / LPF NONE SEEN < OR = 5 /HPF   Bacteria, UA NONE SEEN NONE SEEN /HPF   Hyaline Cast NONE SEEN NONE SEEN /LPF  Urine Culture     Status: None   Collection Time: 12/22/18 12:00 AM   Specimen: Urine  Result Value Ref Range  MICRO NUMBER: [id number]    SPECIMEN QUALITY: Adequate    Sample Source URINE    STATUS: FINAL    Result: No Growth   Protime-INR     Status: None   Collection Time: 12/22/18 12:00 AM  Result Value Ref Range   INR 1.0     Comment: Reference Range                     0.9-1.1 Moderate-intensity Warfarin Therapy 2.0-3.0 Higher-intensity Warfarin Therapy   3.0-4.0  .    Prothrombin Time 10.2 9.0 - 11.5 sec    Comment: For additional information, please refer to http://education.[facility].com/faq/FAQ104 (This link is being provided for informational/ educational purposes only.)   APTT     Status: None   Collection Time: 12/22/18 12:00 AM  Result Value Ref Range   aPTT 26 23 - 32 sec    Comment: . This test has not been validated for monitoring unfractionated heparin therapy. For testing that is validated for this type of therapy, please refer to the Heparin Anti-Xa assay (test code  [phone]). . For additional information, please refer to http://education.[facility].com/faq/FAQ159 (This link is being provided for  informational/educational purposes only.)   Epstein-Barr virus VCA antibody panel     Status: Abnormal   Collection Time: 12/22/18 12:00 AM  Result Value Ref Range   EBV VCA IgM <36.00 U/mL    Comment:       U/mL              Interpretation       ----              --------------       <36.00            Negative       36.00-43.99       Equivocal       >43.99            Positive    EBV VCA IgG >750.00 (H) U/mL    Comment:        U/mL             Interpretation        ----             --------------        <18.00           Negative        18.00-21.99      Equivocal        >21.99           Positive    EBV NA IgG 65.50 (H) U/mL    Comment:        U/mL             Interpretation        ----             --------------        <18.00           Negative        18.00-21.99      Equivocal        >21.99           Positive    Interpretation      Comment: . Suggestive of a past Epstein-Barr virus infection. In infants, a similar pattern may occur as a result of passive maternal transfer of antibody. .     PHQ2/9: Depression screen [facility] 2/9 12/17/2018 09/17/2018  Decreased Interest 0  0  Down, Depressed, Hopeless 0 0  PHQ - 2 Score 0 0  Altered sleeping 0 0  Tired, decreased energy 0 0  Change in appetite 0 0  Feeling bad or failure about yourself  0 0  Trouble concentrating 0 0  Moving slowly or fidgety/restless 0 0  Suicidal thoughts 0 0  PHQ-9 Score 0 0  Difficult doing work/chores Not difficult at all Not difficult at all    phq 9 is negative Reviewed today  Fall Risk: Fall Risk  12/17/2018 09/17/2018  Falls in the past year? 0 0  Number falls in past yr: 0 0  Injury with Fall? 0 0    Assessment & Plan:   1. Elevated LFTs LFTs have increased may be secondary to Humira, infection or other etiology will check coags - Protime-INR -  PTT  2. 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 Immunocompromised state due to drug therapy Secondary to Humira from dermatology  4. Leukocytosis, unspecified type Consulted with supervising physician and hematologist who advised working up leukocytosis for infection or inflammation in the setting of Humira use  5. Thrombocytosis ([place]) Per hematology  6. Pansinusitis, unspecified chronicity Patient reports chronic sinusitis with new Humira use of over the past 2 months which can cause sinusitis we will treat with antibiotics given his history, duration and appearance on exam today, may be contributing towards some of his swollen lymph nodes will monitor lymphadenopathy over the next 2 to 4 weeks - amoxicillin-clavulanate (AUGMENTIN) 875-125 MG tablet; Take 1 tablet by mouth 2 (two) times daily for 10 days.  Dispense: 20 tablet; Refill: 0    Return for 4 weeks as needed if lymph nodes do not improve.   Delsa Grana, PA-C 12/28/18 2:29 PM

## 2019-01-14 NOTE — Unmapped (Signed)
[facility] Refill Coordination Note    Specialty Medication(s) to be Shipped:   Inflammatory Disorders: Humira    Other medication(s) to be shipped: N/A     Reginald Conway, DOB: 1985-10-31  Phone: [phone] (home)       All above HIPAA information was verified with patient.     Was a Nurse, learning disability used for this call? No    Completed refill call assessment today to schedule patient's medication shipment from the [facility] [phone]).       Specialty medication(s) and dose(s) confirmed: Regimen is correct and unchanged.   Changes to medications: Reginald Conway reports no changes at this time.  Changes to insurance: No  Questions for the pharmacist: No    Confirmed patient received Welcome Packet with first shipment. The patient will receive a drug information handout for each medication shipped and additional FDA Medication Guides as required.       DISEASE/MEDICATION-SPECIFIC INFORMATION        For patients on injectable medications: Patient currently has 1 doses left.  Next injection is scheduled for 01/17/19.    SPECIALTY MEDICATION ADHERENCE     Medication Adherence    Patient reported X missed doses in the last month: 0  Specialty Medication: Humira 40mg /0.14mL  Patient is on additional specialty medications: No  Informant: patient                Humira 40mg /0.34mL: 3 days of medicine on hand       SHIPPING     Shipping address confirmed in Epic.     Delivery Scheduled: Yes, Expected medication delivery date: 01/19/19.     Medication will be delivered via Same Day Courier to the prescription address in Epic [place].    Wyatt Mage M Elisabeth Cara   [facility] Specialty Technician

## 2019-01-18 DIAGNOSIS — H5213 Myopia, bilateral: Secondary | ICD-10-CM | POA: Diagnosis not present

## 2019-01-19 MED FILL — HUMIRA PEN CITRATE FREE 40 MG/0.4 ML: SUBCUTANEOUS | 28 days supply | Qty: 4 | Fill #3

## 2019-01-19 MED FILL — HUMIRA PEN CITRATE FREE 40 MG/0.4 ML: 28 days supply | Qty: 4 | Fill #3 | Status: AC

## 2019-01-24 ENCOUNTER — Ambulatory Visit: Payer: Medicaid Other | Admitting: Family Medicine

## 2019-01-24 ENCOUNTER — Other Ambulatory Visit: Payer: Self-pay

## 2019-01-24 ENCOUNTER — Encounter: Payer: Self-pay | Admitting: Family Medicine

## 2019-01-24 VITALS — BP 136/82 | HR 89 | Temp 98.1°F | Resp 14 | Ht 69.0 in | Wt 224.1 lb

## 2019-01-24 DIAGNOSIS — R590 Localized enlarged lymph nodes: Secondary | ICD-10-CM

## 2019-01-24 DIAGNOSIS — D75839 Thrombocytosis, unspecified: Secondary | ICD-10-CM

## 2019-01-24 DIAGNOSIS — D84821 Immunodeficiency due to drugs: Secondary | ICD-10-CM | POA: Insufficient documentation

## 2019-01-24 DIAGNOSIS — J310 Chronic rhinitis: Secondary | ICD-10-CM | POA: Insufficient documentation

## 2019-01-24 DIAGNOSIS — R7989 Other specified abnormal findings of blood chemistry: Secondary | ICD-10-CM

## 2019-01-24 DIAGNOSIS — D473 Essential (hemorrhagic) thrombocythemia: Secondary | ICD-10-CM

## 2019-01-24 DIAGNOSIS — D72829 Elevated white blood cell count, unspecified: Secondary | ICD-10-CM | POA: Diagnosis not present

## 2019-01-24 DIAGNOSIS — J324 Chronic pansinusitis: Secondary | ICD-10-CM | POA: Diagnosis not present

## 2019-01-24 DIAGNOSIS — Z79899 Other long term (current) drug therapy: Secondary | ICD-10-CM

## 2019-01-24 LAB — CBC WITH DIFFERENTIAL/PLATELET
Absolute Monocytes: 848 cells/uL (ref 200–950)
Basophils Absolute: 141 cells/uL (ref 0–200)
Basophils Relative: 1.4 %
Eosinophils Absolute: 212 cells/uL (ref 15–500)
Eosinophils Relative: 2.1 %
HCT: 47.2 % (ref 38.5–50.0)
Hemoglobin: 16.4 g/dL (ref 13.2–17.1)
Lymphs Abs: 4616 cells/uL — ABNORMAL HIGH (ref 850–3900)
MCH: 30.9 pg (ref 27.0–33.0)
MCHC: 34.7 g/dL (ref 32.0–36.0)
MCV: 88.9 fL (ref 80.0–100.0)
MPV: 9.5 fL (ref 7.5–12.5)
Monocytes Relative: 8.4 %
Neutro Abs: 4282 cells/uL (ref 1500–7800)
Neutrophils Relative %: 42.4 %
Platelets: 447 10*3/uL — ABNORMAL HIGH (ref 140–400)
RBC: 5.31 10*6/uL (ref 4.20–5.80)
RDW: 13.3 % (ref 11.0–15.0)
Total Lymphocyte: 45.7 %
WBC: 10.1 10*3/uL (ref 3.8–10.8)

## 2019-01-24 LAB — COMPLETE METABOLIC PANEL WITH GFR
AG Ratio: 1.6 (calc) (ref 1.0–2.5)
ALT: 71 U/L — ABNORMAL HIGH (ref 9–46)
AST: 34 U/L (ref 10–40)
Albumin: 4.4 g/dL (ref 3.6–5.1)
Alkaline phosphatase (APISO): 71 U/L (ref 36–130)
BUN: 11 mg/dL (ref 7–25)
CO2: 28 mmol/L (ref 20–32)
Calcium: 10.2 mg/dL (ref 8.6–10.3)
Chloride: 102 mmol/L (ref 98–110)
Creat: 1.17 mg/dL (ref 0.60–1.35)
GFR, Est African American: 93 mL/min/{1.73_m2} (ref >=60)
GFR, Est Non African American: 80 mL/min/{1.73_m2} (ref >=60)
Globulin: 2.7 g/dL (calc) (ref 1.9–3.7)
Glucose, Bld: 85 mg/dL (ref 65–99)
Potassium: 5.2 mmol/L (ref 3.5–5.3)
Sodium: 138 mmol/L (ref 135–146)
Total Bilirubin: 0.5 mg/dL (ref 0.2–1.2)
Total Protein: 7.1 g/dL (ref 6.1–8.1)

## 2019-01-24 MED ORDER — FLUTICASONE PROPIONATE 50 MCG/ACT NA SUSP: 2.0000 | Freq: Every day | NASAL | 2 refills | Status: DC

## 2019-01-24 MED ORDER — MONTELUKAST SODIUM 10 MG PO TABS: 10.0000 mg | ORAL_TABLET | Freq: Every day | ORAL | 3 refills | Status: DC

## 2019-01-24 MED ORDER — LEVOCETIRIZINE DIHYDROCHLORIDE 5 MG PO TABS: 5.0000 mg | ORAL_TABLET | Freq: Every evening | ORAL | 2 refills | Status: DC

## 2019-01-24 NOTE — Progress Notes (Signed)
Patient ID: Mario Boyd, male    DOB: 09-23-1985, 34 y.o.   MRN: [id number]  PCP: Delsa Grana, PA-C  Chief Complaint  Patient presents with  . Follow-up    labs  . Hyperlipidemia    pt not taking causes joint pain    Subjective:   Mario Boyd is a 34 y.o. male, presents to clinic with CC of the following:  HPI   4 week f/up on lymphadenopathy, he reported hx of intermittent swollen painful lymph nodes secondary to chronic sinusitis and dissecting cellulitis, recently started tx with humira, had abnormal labs and is seeing hematology, dermatology and is relatively new here.  Patient was diagnosed with sinusitis was given antibiotics about 1 month ago, he states he completed augmentin for 10 days with GI SE/diarrhea 12/16 to 12/26.  His nasal and sinus symptoms did not improve that much he currently has a headache which he points to both of his maxillary sinuses and cheeks and around his eyes stating that he continues to have pain and pressure.  He reports now a history of past nasal trauma where he was told he would need to have surgery to fix because it would cause chronic sinus problems, he also endorses history of nasal allergies, and not taking his over-the-counter allergy medications except for Claritin.  He states that his scalp infections and dissecting cellulitis have started to get a little bit worse over the past couple weeks but is not sure why.  He has not followed up with dermatology.  Patient reports that his muscle aches and leg pains resolved after he he stopped lipitor, so he has not taken for the past month.  He does have upcoming follow-up with hematology and with dermatology in the next couple months.  No other significant changes or concerning symptoms.  States that he continues to have intermittent sweats per his baseline.  Has not had any fever chills sweats weight loss or changes to diet or exercise.    Patient Active Problem List   Diagnosis Date  Noted  . Class 1 obesity with serious comorbidity and body mass index (BMI) of 31.0 to 31.9 in adult 09/17/2018  . Elevated LFTs 09/17/2018  . Hypertriglyceridemia 09/17/2018  . History of nephrectomy, right 09/17/2018  . Hidradenitis suppurativa 09/17/2018  . Thrombocytosis ([place]) 09/17/2018      Current Outpatient Medications:  .  Adalimumab 80 MG/0.8ML PNKT, Inject into the skin., Disp: , Rfl:  .  atorvastatin (LIPITOR) 20 MG tablet, TAKE 1 TABLET BY MOUTH EVERY DAY (Patient not taking: Reported on 01/24/2019), Disp: 30 tablet, Rfl: 1 .  fluticasone (FLONASE) 50 MCG/ACT nasal spray, Place 2 sprays into both nostrils daily., Disp: 16 g, Rfl: 2 .  levocetirizine (XYZAL) 5 MG tablet, Take 1 tablet (5 mg total) by mouth every evening., Disp: 30 tablet, Rfl: 2 .  montelukast (SINGULAIR) 10 MG tablet, Take 1 tablet (10 mg total) by mouth at bedtime., Disp: 30 tablet, Rfl: 3   No Known Allergies   Family History  Problem Relation Age of Onset  . Hyperlipidemia Mother   . Hypertension Mother   . Other Mother        thrombocytosis  . Hyperlipidemia Father   . Hypertension Father   . Epilepsy Sister   . Hypertension Sister   . Epilepsy Son   . Heart failure Maternal Grandmother   . Diabetes Maternal Grandfather   . Heart failure Maternal Grandfather   . Other Maternal Grandfather  thrombocytosis  . Diabetes Paternal Grandmother   . Hypertension Paternal Grandfather   . Prostate cancer Paternal Grandfather      Social History   Socioeconomic History  . Marital status: Married    Spouse name: alice  . Number of children: 2  . Years of education: 50  . Highest education level: Associate degree: occupational, Hotel manager, or vocational program  Occupational History  . Occupation: unemployed  Tobacco Use  . Smoking status: Former Smoker    Packs/day: 0.50    Years: 15.00    Pack years: 7.50    Types: Cigarettes  . Smokeless tobacco: Never Used  Substance and Sexual  Activity  . Alcohol use: Never  . Drug use: Never  . Sexual activity: Yes    Comment: wife on birth control, 9 years  Other Topics Concern  . Not on file  Social History Narrative   Vape; quit smoking; no alcohol; works at [employer]; in [place] 2 children.    Social Determinants of Health   Financial Resource Strain: Low Risk   . Difficulty of Paying Living Expenses: Not hard at all  Food Insecurity: No Food Insecurity  . Worried About Charity fundraiser in the Last Year: Never true  . Ran Out of Food in the Last Year: Never true  Transportation Needs: No Transportation Needs  . Lack of Transportation (Medical): No  . Lack of Transportation (Non-Medical): No  Physical Activity: Inactive  . Days of Exercise per Week: 0 days  . Minutes of Exercise per Session: 0 min  Stress: No Stress Concern Present  . Feeling of Stress : Not at all  Social Connections: Moderately Isolated  . Frequency of Communication with Friends and Family: Once a week  . Frequency of Social Gatherings with Friends and Family: Once a week  . Attends Religious Services: Never  . Active Member of Clubs or Organizations: No  . Attends Archivist Meetings: Never  . Marital Status: Married  Human resources officer Violence: Not At Risk  . Fear of Current or Ex-Partner: No  . Emotionally Abused: No  . Physically Abused: No  . Sexually Abused: No    Chart Review Today: I personally reviewed active problem list, medication list, allergies, family history, social history, health maintenance, notes from last encounter, lab results, imaging with the patient/caregiver today.   Review of Systems  Constitutional: Negative.   HENT: Negative.   Eyes: Negative.   Respiratory: Negative.   Cardiovascular: Negative.   Gastrointestinal: Negative.   Endocrine: Negative.   Genitourinary: Negative.   Musculoskeletal: Negative.   Skin: Negative.   Allergic/Immunologic: Negative.   Neurological: Negative.     Hematological: Negative.   Psychiatric/Behavioral: Negative.   All other systems reviewed and are negative.      Objective:   Vitals:   01/24/19 1029  BP: 136/82  Pulse: 89  Resp: 14  Temp: 98.1 F (36.7 C)  SpO2: 98%  Weight: 224 lb 1.6 oz (101.7 kg)  Height: 5\' 9"  (1.753 m)    Body mass index is 33.09 kg/m.  Physical Exam Vitals and nursing note reviewed.  Constitutional:      General: He is not in acute distress.    Appearance: Normal appearance. He is well-developed. He is obese. He is not ill-appearing, toxic-appearing or diaphoretic.  HENT:     Head: Normocephalic and atraumatic.     Jaw: There is normal jaw occlusion. No trismus.     Right Ear: Ear canal and external ear  normal.     Left Ear: Ear canal and external ear normal.     Ears:     Comments: Bilateral tympanic membranes slightly dull and mildly erythematous, intact, no visualized bulging    Nose: Mucosal edema, congestion and rhinorrhea present.     Right Turbinates: Enlarged and swollen.     Left Turbinates: Enlarged and swollen.     Right Sinus: Maxillary sinus tenderness present. No frontal sinus tenderness.     Left Sinus: Maxillary sinus tenderness present. No frontal sinus tenderness.     Comments: Severely edematous and erythematous nasal mucosa and nasal turbinates bilaterally, left nare  is nearly occluded    Mouth/Throat:     Mouth: Mucous membranes are moist. Mucous membranes are not pale, not dry and not cyanotic.     Pharynx: Oropharynx is clear. Uvula midline. No pharyngeal swelling, oropharyngeal exudate, posterior oropharyngeal erythema or uvula swelling.     Tonsils: No tonsillar exudate or tonsillar abscesses. 0 on the right. 0 on the left.  Eyes:     General: Lids are normal.        Right eye: No discharge.        Left eye: No discharge.     Conjunctiva/sclera: Conjunctivae normal.     Pupils: Pupils are equal, round, and reactive to light.  Neck:     Trachea: Trachea and  phonation normal. No tracheal deviation.  Cardiovascular:     Rate and Rhythm: Normal rate and regular rhythm.     Pulses:          Radial pulses are 2+ on the right side and 2+ on the left side.     Heart sounds: Normal heart sounds. No murmur. No friction rub. No gallop.   Pulmonary:     Effort: Pulmonary effort is normal. No tachypnea, accessory muscle usage or respiratory distress.     Breath sounds: Normal breath sounds. No stridor. No decreased breath sounds, wheezing, rhonchi or rales.  Abdominal:     General: Bowel sounds are normal. There is no distension.     Palpations: Abdomen is soft.     Tenderness: There is no abdominal tenderness.  Musculoskeletal:        General: Normal range of motion.     Cervical back: Normal range of motion and neck supple.  Skin:    General: Skin is warm and dry.     Capillary Refill: Capillary refill takes less than 2 seconds.     Coloration: Skin is not pale.     Findings: No rash.     Nails: There is no clubbing.  Neurological:     Mental Status: He is alert and oriented to person, place, and time.     Motor: No abnormal muscle tone.     Coordination: Coordination normal.     Gait: Gait normal.  Psychiatric:        Speech: Speech normal.        Behavior: Behavior normal. Behavior is cooperative.      Results for orders placed or performed in visit on 01/24/19  CBC with Differential/Platelet  Result Value Ref Range   WBC 10.1 3.8 - 10.8 Thousand/uL   RBC 5.31 4.20 - 5.80 Million/uL   Hemoglobin 16.4 13.2 - 17.1 g/dL   HCT 47.2 38.5 - 50.0 %   MCV 88.9 80.0 - 100.0 fL   MCH 30.9 27.0 - 33.0 pg   MCHC 34.7 32.0 - 36.0 g/dL   RDW 13.3 11.0 -  15.0 %   Platelets 447 (H) 140 - 400 Thousand/uL   MPV 9.5 7.5 - 12.5 fL   Neutro Abs 4,282 1,500 - 7,800 cells/uL   Lymphs Abs 4,616 (H) 850 - 3,900 cells/uL   Absolute Monocytes 848 200 - 950 cells/uL   Eosinophils Absolute 212 15 - 500 cells/uL   Basophils Absolute 141 0 - 200 cells/uL    Neutrophils Relative % 42.4 %   Total Lymphocyte 45.7 %   Monocytes Relative 8.4 %   Eosinophils Relative 2.1 %   Basophils Relative 1.4 %  COMPLETE METABOLIC PANEL WITH GFR  Result Value Ref Range   Glucose, Bld 85 65 - 99 mg/dL   BUN 11 7 - 25 mg/dL   Creat 1.17 0.60 - 1.35 mg/dL   GFR, Est Non African American 80 > OR = 60 mL/min/1.27m2   GFR, Est African American 93 > OR = 60 mL/min/1.97m2   BUN/Creatinine Ratio NOT APPLICABLE 6 - 22 (calc)   Sodium 138 135 - 146 mmol/L   Potassium 5.2 3.5 - 5.3 mmol/L   Chloride 102 98 - 110 mmol/L   CO2 28 20 - 32 mmol/L   Calcium 10.2 8.6 - 10.3 mg/dL   Total Protein 7.1 6.1 - 8.1 g/dL   Albumin 4.4 3.6 - 5.1 g/dL   Globulin 2.7 1.9 - 3.7 g/dL (calc)   AG Ratio 1.6 1.0 - 2.5 (calc)   Total Bilirubin 0.5 0.2 - 1.2 mg/dL   Alkaline phosphatase (APISO) 71 36 - 130 U/L   AST 34 10 - 40 U/L   ALT 71 (H) 9 - 46 U/L        Assessment & Plan:      ICD-10-CM   1. Cervical lymphadenopathy  R59.0    coming and going, none palpable today, no indication for imaging  2. Chronic rhinitis  J31.0 Ambulatory referral to ENT    fluticasone (FLONASE) 50 MCG/ACT nasal spray   pt states year round, used to be on allergy meds, now only on allegra - restart flonase, xyzal try singulair, f/up ENT with humira and chronic severe sx  3. Pansinusitis, unspecified chronicity  J32.4 Ambulatory referral to ENT   At last visit stated he has chronic sinus congestion and infections did 10 days of Augmentin, no sig improvement, HA's today to sinuses b/l  4. Elevated LFTs  [id number] COMPLETE METABOLIC PANEL WITH GFR   ALT still elevated, recheck labs today, no definitive cause found - may be secondary to elevated cholesterol, diet or medication  5. Immunocompromised state due to drug therapy  D84.821 CBC with Differential/Platelet   Z79.899   6. Leukocytosis, unspecified type  D72.829 CBC with Differential/Platelet   WBC trending down, recheck labs today  7.  Thrombocytosis ([place])  D47.3 CBC with Differential/Platelet   per hematology        Delsa Grana, PA-C 01/24/19 5:57 PM

## 2019-02-15 DIAGNOSIS — H52223 Regular astigmatism, bilateral: Secondary | ICD-10-CM | POA: Diagnosis not present

## 2019-02-15 NOTE — Unmapped (Signed)
[facility] Refill Coordination Note    Specialty Medication(s) to be Shipped:   Inflammatory Disorders: Humira    Other medication(s) to be shipped: n/a     Reginald Conway, DOB: 01/12/1985  Phone: [phone] (home)       All above HIPAA information was verified with patient.     Was a Nurse, learning disability used for this call? No    Completed refill call assessment today to schedule patient's medication shipment from the [facility] [phone]).       Specialty medication(s) and dose(s) confirmed: Regimen is correct and unchanged.   Changes to medications: Reginald Conway reports no changes at this time.  Changes to insurance: No  Questions for the pharmacist: No    Confirmed patient received Welcome Packet with first shipment. The patient will receive a drug information handout for each medication shipped and additional FDA Medication Guides as required.       DISEASE/MEDICATION-SPECIFIC INFORMATION        For patients on injectable medications: Patient currently has 1 doses left.  Next injection is scheduled for 02/21/2019.    SPECIALTY MEDICATION ADHERENCE     Medication Adherence    Patient reported X missed doses in the last month: 0  Specialty Medication: Humira CF 40 mg/0.4 ml  Patient is on additional specialty medications: No  Any gaps in refill history greater than 2 weeks in the last 3 months: no  Demonstrates understanding of importance of adherence: yes  Informant: patient  Reliability of informant: reliable  Confirmed plan for next specialty medication refill: delivery by pharmacy  Refills needed for supportive medications: not needed                Humira CF 40 mg/0.4 ml. 7 days on hand      SHIPPING     Shipping address confirmed in Epic.     Delivery Scheduled: Yes, Expected medication delivery date: 02/18/2019.     Medication will be delivered via Same Day Courier to the prescription address in Epic WAM.    Bryttney Netzer D Coden Franchi   [facility] Specialty Technician

## 2019-02-18 MED FILL — HUMIRA PEN CITRATE FREE 40 MG/0.4 ML: 28 days supply | Qty: 4 | Fill #4 | Status: AC

## 2019-02-18 MED FILL — HUMIRA PEN CITRATE FREE 40 MG/0.4 ML: SUBCUTANEOUS | 28 days supply | Qty: 4 | Fill #4

## 2019-02-25 ENCOUNTER — Telehealth: Admit: 2019-02-25 | Discharge: 2019-02-26 | Payer: MEDICAID | Attending: Dermatology | Primary: Dermatology

## 2019-02-25 DIAGNOSIS — L663 Perifolliculitis capitis abscedens: Principal | ICD-10-CM

## 2019-02-25 DIAGNOSIS — D473 Essential (hemorrhagic) thrombocythemia: Secondary | ICD-10-CM | POA: Diagnosis not present

## 2019-02-25 DIAGNOSIS — Z79899 Other long term (current) drug therapy: Secondary | ICD-10-CM | POA: Diagnosis not present

## 2019-02-25 MED ORDER — CLINDAMYCIN HCL 300 MG CAPSULE
ORAL_CAPSULE | Freq: Two times a day (BID) | ORAL | 1 refills | 30.00000 days | Status: CP
Start: 2019-02-25 — End: ?

## 2019-02-25 MED ORDER — RIFAMPIN 300 MG CAPSULE
ORAL_CAPSULE | Freq: Two times a day (BID) | ORAL | 1 refills | 30.00000 days | Status: CP
Start: 2019-02-25 — End: 2019-03-27

## 2019-02-25 NOTE — Unmapped (Signed)
[facility]  Dermatology Video Visit Encounter  Established Patient       Encounter Description/Consent: This encounter was conducted from provider's home via live, face-to-face video conference with the patient. Marq Rebello was located in his home. Discussed the choice to participate in care through the use of teledermatology by secure video conferencing. Teledermatology enables health care providers at different locations to provide safe, effective, and convenient care through the use of technology. As with any health care service, there are risks associated with the use of teledermatology, including equipment failure, poor image resolution, and information security issues. Also discussed the limitations of a visual physical exam and that there may be instances were they need to come to clinic to complete the assessment. Patient verbally understands the risks and benefits of teledermatology as explained. All questions regarding teledermatology answered.    I spent 5 minutes on the real-time audio and video and 15 minutes on the phone with the patient on the date of service. I spent an additional 10 minutes on pre- and post-visit activities.     The patient was physically located in [place] or a state in which I am permitted to provide care. The patient and/or parent/guardian understood that s/he may incur co-pays and cost sharing, and agreed to the telemedicine visit. The visit was reasonable and appropriate under the circumstances given the patient's presentation at the time.    The patient and/or parent/guardian has been advised of the potential risks and limitations of this mode of treatment (including, but not limited to, the absence of in-person examination) and has agreed to be treated using telemedicine. The patient's/patient's family's questions regarding telemedicine have been answered.     If the visit was completed in an ambulatory setting, the patient and/or parent/guardian has also been advised to contact their provider???s office for worsening conditions, and seek emergency medical treatment and/or call 911 if the patient deems either necessary.      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  - PMHX negative for liver/kidney disease, IBD, demyelinating/neurologic disease (e.g. MS), TB infection, malignancy, heart failure, hepatitis C/B, HIV  - Humira has improved condition but still with flaring intermittently. We discussed additional management options including systemic antibiotic use, increase of weekly dosing of Humira given modest improvement, Remicade, additional biologic. A mutual decision was made to proceed as below:   - Start Clindamycin 300 mg BID, Rifampin 300 mg BID (may turn bodily fluids including urine orange colored) for present flare  - Increase Humira from 40 mg weekly to 80 mg weekly given prior modest improvement as higher dosing may be needed given weight (100 kg) (pending insurance approval, will send to [employer])    History of Thrombocythemia  - Discussed that platelet counts are an acute phase reactant and evidence of this can be seen with Follicular Occlusion type syndromes such as HS  - Favor Leukocytosis as likely 2/2 inflammatory state as well. However, can see Hematology for consultation to rule out other etiology, encouraged to talk to PCP regarding this.   - Do not think lab changes are 2/2 Humira    RTC: 3 months   ??      [place] Covid19 helpline # is [phone].       Subjective:     Chief Concern:  Follow up  scalp     Interval History: 34 year old male who presents on video and telephone visit for follow up of dissecting cellulitis and acne on chest, back. He started Humira 40 mg weekly in August 2020. He has noted improvement in frequency and duration of flares on scalp, neck. However, he had a severe flare recently with drainage and pain on scalp. Also noted acne break out on chest and back. He had a sinus infection recently and noted that duration of infection was longer that it had been previously (attributes to Humira use). Feeling well now. Denies recent fevers, chills, nausea, vomiting, cough, sob, myalgias, arthralgias, headache, vision changes.     Does endorse history of elevated platelets for several years. Had elevated HgB (17), Leukocytosis (15) and thrombocytosis (447) on labs in December 2020. PCP thought might be secondary to Humira. Denies use of prednisone, illness, feeling unwell at that time. Has not ever seen a hematologist.       Family History: (+) Sister with HS    Social History: reviewed; pertinents have been documented in the interval history section.    ROS:  As per Interval History and:  Constitutional:  no significant appetite change; no fatigue; system negative except:      Objective:        Medications: reviewed at today's visit    PE:   General: Patient in no acute distress  Neuro: AAO x 3, in NAD  Skin: Patient did not upload images of his face, scalp or chest today       Lorayne Bender, MD  02/25/2019

## 2019-02-28 MED ORDER — HUMIRA PEN CITRATE FREE 40 MG/0.4 ML
SUBCUTANEOUS | 5 refills | 28.00000 days | Status: CP
Start: 2019-02-28 — End: 2019-08-27
  Filled 2019-03-04: qty 8, 28d supply, fill #0

## 2019-02-28 NOTE — Unmapped (Signed)
Humira dose increase from 1 pen weekly to 2 pens weekly  Last filled 01/18/19  Copay = $3

## 2019-03-02 NOTE — Unmapped (Signed)
[facility] Refill Coordination Note    Patient is aware of dose increase. He currently has 3 pens on hand. He will take 1 pen on 3/1 then start his 2 pens per week dosing on 3/8    Specialty Medication(s) to be Shipped:   Inflammatory Disorders: Humira    Other medication(s) to be shipped: n/a     Reginald Conway, DOB: May 26, 1985  Phone: [phone] (home)       All above HIPAA information was verified with patient.     Was a Nurse, learning disability used for this call? No    Completed refill call assessment today to schedule patient's medication shipment from the [facility] [phone]).       Specialty medication(s) and dose(s) confirmed: Patient reports changes to the regimen as follows: patient now takes 2 pens once a week   Changes to medications: Jailyn Langhorst reports no changes at this time.  Changes to insurance: No  Questions for the pharmacist: No    Confirmed patient received Welcome Packet with first shipment. The patient will receive a drug information handout for each medication shipped and additional FDA Medication Guides as required.       DISEASE/MEDICATION-SPECIFIC INFORMATION        For patients on injectable medications: Patient currently has 3 pens on hand doses left.  Next injection is scheduled for 03/07/2019.    SPECIALTY MEDICATION ADHERENCE     Medication Adherence    Patient reported X missed doses in the last month: 0  Specialty Medication: Humira CF 40 mg/0.4 ml  Patient is on additional specialty medications: No  Any gaps in refill history greater than 2 weeks in the last 3 months: no  Demonstrates understanding of importance of adherence: yes  Informant: patient  Reliability of informant: reliable  Confirmed plan for next specialty medication refill: delivery by pharmacy  Refills needed for supportive medications: not needed                Humira CF 40 mg/0.4 ml. 14 days on hand      SHIPPING     Shipping address confirmed in Epic.     Delivery Scheduled: Yes, Expected medication delivery date: 03/04/2019.     Medication will be delivered via Same Day Courier to the prescription address in Epic WAM.    Aliana Kreischer D Lucita Montoya   [facility] Specialty Technician

## 2019-03-04 MED FILL — HUMIRA PEN CITRATE FREE 40 MG/0.4 ML: 28 days supply | Qty: 8 | Fill #0 | Status: AC

## 2019-03-29 DIAGNOSIS — Z23 Encounter for immunization: Secondary | ICD-10-CM | POA: Diagnosis not present

## 2019-04-07 NOTE — Unmapped (Signed)
Leonette Most reports he's seen some improvement in his skin since increasing Humira to 80 mg/week. He's noticed a little more fatigue, but able to complete his ADLs. He didn't find the antibiotics to be helpful and stopped those.     [facility] Clinical Assessment & Refill Coordination Note    Yoon Barca, DOB: November 29, 1985  Phone: [phone] (home)     All above HIPAA information was verified with patient.     Was a Nurse, learning disability used for this call? No    Specialty Medication(s):   Inflammatory Disorders: Humira     Current Outpatient Medications   Medication Sig Dispense Refill   ??? atorvastatin (LIPITOR) 20 MG tablet Take 20 mg by mouth daily.     ??? clindamycin (CLEOCIN) 300 MG capsule Take 1 capsule (300 mg total) by mouth two (2) times a day. 60 capsule 1   ??? empty container Misc Use as directed to dispose of Humira pens. 1 each 2   ??? HUMIRA PEN CITRATE FREE 40 MG/0.4 ML Inject the contents of 2 pens (80 mg total) under the skin once a week. 8 each 5   ??? HUMIRA PEN CITRATE FREE STARTER PACK FOR CROHN'S/UC/HS 3 X 80 MG/0.8 ML Inject  the contents of 2 pens (160 mg) under the skin on day 1, then 1 pen (80 mg) on day 15. Mainenance dosing will begin on day 29. 3 each 0   ??? ISOtretinoin (ACCUTANE) 40 MG capsule Take 1 capsule (40 mg total) by mouth daily. 30 capsule 0   ??? predniSONE (DELTASONE) 10 mg tablet pack Take by mouth.     ??? predniSONE (DELTASONE) 10 mg tablet pack PLEASE SEE ATTACHED FOR DETAILED DIRECTIONS     ??? predniSONE (DELTASONE) 10 MG tablet Take 40mg  daily x7 days, then 30mg  daily x7 days, then 20mg  daily x7 days, then 10mg  daily x7 days, then stop 70 tablet 0   ??? sulfamethoxazole-trimethoprim (BACTRIM DS) 800-160 mg per tablet Take 1 tablet (160 mg of trimethoprim total) by mouth Two (2) times a day. 60 tablet 1     No current facility-administered medications for this visit.         Changes to medications: Domonik Levario reports no changes at this time.    No Known Allergies    Changes to allergies: No    SPECIALTY MEDICATION ADHERENCE     Humira - 2 left (1 dose)  Medication Adherence    Patient reported X missed doses in the last month: 0  Specialty Medication: Humira          Specialty medication(s) dose(s) confirmed: Regimen is correct and unchanged.     Are there any concerns with adherence? No    Adherence counseling provided? Not needed    CLINICAL MANAGEMENT AND INTERVENTION      Clinical Benefit Assessment:    Do you feel the medicine is effective or helping your condition? Yes    Clinical Benefit counseling provided? Not needed    Adverse Effects Assessment:    Are you experiencing any side effects? yes, see above. counseled to let provider know if interferes with ADLs or gets worse    Are you experiencing difficulty administering your medicine? 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 For example, brushing your teeth or getting up in the morning. 0    Have you  discussed this with your provider? Not needed    Therapy Appropriateness:    Is therapy appropriate? Yes, therapy is appropriate and should be continued    DISEASE/MEDICATION-SPECIFIC INFORMATION      For patients on injectable medications: Patient currently has 1 doses left.  Next injection is scheduled for Monday, April 5.    PATIENT SPECIFIC NEEDS     ? Does the patient have any physical, cognitive, or cultural barriers? No    ? Is the patient high risk? No     ? Does the patient require a Care Management Plan? No     ? Does the patient require physician intervention or other additional services (i.e. nutrition, smoking cessation, social work)? No      SHIPPING     Specialty Medication(s) to be Shipped:   Inflammatory Disorders: Humira    Other medication(s) to be shipped: na     Changes to insurance: No    Delivery Scheduled: Yes, Expected medication delivery date: Friday, April 9.     Medication will be delivered via Same Day Courier to the confirmed prescription address in [facility].    The patient will receive a drug information handout for each medication shipped and additional FDA Medication Guides as required.  Verified that patient has previously received a Conservation officer, historic buildings.    All of the patient's questions and concerns have been addressed.    Lanney Gins   [facility] Specialty Pharmacist

## 2019-04-11 ENCOUNTER — Inpatient Hospital Stay (HOSPITAL_BASED_OUTPATIENT_CLINIC_OR_DEPARTMENT_OTHER): Payer: Medicaid Other | Admitting: Internal Medicine

## 2019-04-11 ENCOUNTER — Other Ambulatory Visit: Payer: Self-pay

## 2019-04-11 ENCOUNTER — Encounter: Payer: Self-pay | Admitting: Internal Medicine

## 2019-04-11 ENCOUNTER — Inpatient Hospital Stay: Payer: Medicaid Other | Attending: Internal Medicine

## 2019-04-11 DIAGNOSIS — D473 Essential (hemorrhagic) thrombocythemia: Secondary | ICD-10-CM | POA: Insufficient documentation

## 2019-04-11 DIAGNOSIS — D75839 Thrombocytosis, unspecified: Secondary | ICD-10-CM

## 2019-04-11 LAB — COMPREHENSIVE METABOLIC PANEL
ALT: 111 U/L — ABNORMAL HIGH (ref 0–44)
AST: 63 U/L — ABNORMAL HIGH (ref 15–41)
Albumin: 4.4 g/dL (ref 3.5–5.0)
Alkaline Phosphatase: 77 U/L (ref 38–126)
Anion gap: 11 (ref 5–15)
BUN: 13 mg/dL (ref 6–20)
CO2: 25 mmol/L (ref 22–32)
Calcium: 9.5 mg/dL (ref 8.9–10.3)
Chloride: 103 mmol/L (ref 98–111)
Creatinine, Ser: 1.2 mg/dL (ref 0.61–1.24)
GFR calc Af Amer: >60 mL/min (ref >60)
GFR calc non Af Amer: >60 mL/min (ref >60)
Glucose, Bld: 97 mg/dL (ref 70–99)
Potassium: 4.1 mmol/L (ref 3.5–5.1)
Sodium: 139 mmol/L (ref 135–145)
Total Bilirubin: 0.7 mg/dL (ref 0.3–1.2)
Total Protein: 8 g/dL (ref 6.5–8.1)

## 2019-04-11 LAB — CBC WITH DIFFERENTIAL/PLATELET
Abs Immature Granulocytes: 0.03 10*3/uL (ref 0.00–0.07)
Basophils Absolute: 0.1 10*3/uL (ref 0.0–0.1)
Basophils Relative: 1 %
Eosinophils Absolute: 0.1 10*3/uL (ref 0.0–0.5)
Eosinophils Relative: 2 %
HCT: 44.4 % (ref 39.0–52.0)
Hemoglobin: 15.9 g/dL (ref 13.0–17.0)
Immature Granulocytes: 0 %
Lymphocytes Relative: 54 %
Lymphs Abs: 4.3 10*3/uL — ABNORMAL HIGH (ref 0.7–4.0)
MCH: 31.4 pg (ref 26.0–34.0)
MCHC: 35.8 g/dL (ref 30.0–36.0)
MCV: 87.7 fL (ref 80.0–100.0)
Monocytes Absolute: 0.8 10*3/uL (ref 0.1–1.0)
Monocytes Relative: 10 %
Neutro Abs: 2.6 10*3/uL (ref 1.7–7.7)
Neutrophils Relative %: 33 %
Platelets: 434 10*3/uL — ABNORMAL HIGH (ref 150–400)
RBC: 5.06 MIL/uL (ref 4.22–5.81)
RDW: 12.9 % (ref 11.5–15.5)
WBC: 7.9 10*3/uL (ref 4.0–10.5)
nRBC: 0 % (ref 0.0–0.2)

## 2019-04-11 LAB — C-REACTIVE PROTEIN: CRP: 2.9 mg/dL — ABNORMAL HIGH (ref <1.0)

## 2019-04-11 LAB — LACTATE DEHYDROGENASE: LDH: 163 U/L (ref 98–192)

## 2019-04-11 NOTE — Assessment & Plan Note (Addendum)
#  Thrombocytosis -likely reactive [secondary inflammation hidradenitis]- jak 2 BCR ABL; MPL; CALR mutation negative on peripheral blood.  Platelets today are 434; trending down [see below].  Would not recommend any further work-up or bone marrow biopsy.  #Hidradenitis-status post evaluation with dermatology currently on Humira.  Clinically seems to be improving.   # DISPOSITION:  # Follow up as needed- Dr.B  Cc; PCP

## 2019-04-11 NOTE — Progress Notes (Signed)
[place] CONSULT NOTE  Patient Care Team: Delsa Grana, PA-C as PCP - General (Family Medicine)  CHIEF COMPLAINTS/PURPOSE OF CONSULTATION: Thrombocytosis  HEMATOLOGY HISTORY  #Question chronic THROMBOCYTOSIS [platelets-500-600; Hb;N- white count-N; as per history-recommended bone marrow biopsy as a child; not done] September 2020 jak 2 BCR ABL; MPL; CALR mutation-NEG; No Bmbx.   #Hypercalcemia calcium 10.9 [aug 2020]; repeat calcium 10.5 ionized calcium normal PTH normal  #Hydradenitis-scalp/ dermatology   HISTORY OF PRESENTING ILLNESS:  Mario Boyd 34 y.o.  male pleasant patient with a history of severe hidradenitis currently on Humira is here for follow-up.  Patient states that he has been getting his Humira injections at home once weekly.  Noted to have improvement of his back lesions/scalp lesions.    Review of Systems  Constitutional: Negative for chills, diaphoresis, fever, malaise/fatigue and weight loss.  HENT: Negative for nosebleeds and sore throat.   Eyes: Negative for double vision.  Respiratory: Negative for cough, hemoptysis, sputum production, shortness of breath and wheezing.   Cardiovascular: Negative for chest pain, palpitations, orthopnea and leg swelling.  Gastrointestinal: Negative for abdominal pain, blood in stool, constipation, diarrhea, heartburn, melena, nausea and vomiting.  Genitourinary: Negative for dysuria, frequency and urgency.  Musculoskeletal: Negative for back pain and joint pain.  Skin: Positive for itching and rash.  Neurological: Negative for dizziness, tingling, focal weakness, weakness and headaches.  Endo/Heme/Allergies: Does not bruise/bleed easily.  Psychiatric/Behavioral: Negative for depression. The patient is not nervous/anxious and does not have insomnia.     MEDICAL HISTORY:  Past Medical History:  Diagnosis Date  . Asthma    childhood  . Hidradenitis suppurativa   . Hyperlipidemia   . Seizures ([place])     as a child    SURGICAL HISTORY: Past Surgical History:  Procedure Laterality Date  . HERNIA REPAIR    . KIDNEY SURGERY    . NEPHRECTOMY LIVING DONOR Right 2009  . TONSILLECTOMY    . TYMPANOSTOMY TUBE PLACEMENT      SOCIAL HISTORY: Social History   Socioeconomic History  . Marital status: Married    Spouse name: alice  . Number of children: 2  . Years of education: 50  . Highest education level: Associate degree: occupational, Hotel manager, or vocational program  Occupational History  . Occupation: unemployed  Tobacco Use  . Smoking status: Former Smoker    Packs/day: 0.50    Years: 15.00    Pack years: 7.50    Types: Cigarettes  . Smokeless tobacco: Never Used  Substance and Sexual Activity  . Alcohol use: Never  . Drug use: Never  . Sexual activity: Yes    Comment: wife on birth control, 9 years  Other Topics Concern  . Not on file  Social History Narrative   Vape; quit smoking; no alcohol; works at [employer]; in [place] 2 children.    Social Determinants of Health   Financial Resource Strain: Low Risk   . Difficulty of Paying Living Expenses: Not hard at all  Food Insecurity: No Food Insecurity  . Worried About Charity fundraiser in the Last Year: Never true  . Ran Out of Food in the Last Year: Never true  Transportation Needs: No Transportation Needs  . Lack of Transportation (Medical): No  . Lack of Transportation (Non-Medical): No  Physical Activity: Inactive  . Days of Exercise per Week: 0 days  . Minutes of Exercise per Session: 0 min  Stress: No Stress Concern Present  . Feeling of Stress : Not  at all  Social Connections: Moderately Isolated  . Frequency of Communication with Friends and Family: Once a week  . Frequency of Social Gatherings with Friends and Family: Once a week  . Attends Religious Services: Never  . Active Member of Clubs or Organizations: No  . Attends Archivist Meetings: Never  . Marital Status: Married   Human resources officer Violence: Not At Risk  . Fear of Current or Ex-Partner: No  . Emotionally Abused: No  . Physically Abused: No  . Sexually Abused: No    FAMILY HISTORY: Family History  Problem Relation Age of Onset  . Hyperlipidemia Mother   . Hypertension Mother   . Other Mother        thrombocytosis  . Hyperlipidemia Father   . Hypertension Father   . Epilepsy Sister   . Hypertension Sister   . Epilepsy Son   . Heart failure Maternal Grandmother   . Diabetes Maternal Grandfather   . Heart failure Maternal Grandfather   . Other Maternal Grandfather        thrombocytosis  . Diabetes Paternal Grandmother   . Hypertension Paternal Grandfather   . Prostate cancer Paternal Grandfather     ALLERGIES:  has No Known Allergies.  MEDICATIONS:  Current Outpatient Medications  Medication Sig Dispense Refill  . Adalimumab (HUMIRA PEN) 40 MG/0.4ML PNKT Inject 80 mg into the skin daily.    Marland Kitchen atorvastatin (LIPITOR) 20 MG tablet TAKE 1 TABLET BY MOUTH EVERY DAY 30 tablet 1  . fluticasone (FLONASE) 50 MCG/ACT nasal spray Place 2 sprays into both nostrils daily. 16 g 2  . levocetirizine (XYZAL) 5 MG tablet Take 1 tablet (5 mg total) by mouth every evening. 30 tablet 2  . montelukast (SINGULAIR) 10 MG tablet Take 1 tablet (10 mg total) by mouth at bedtime. 30 tablet 3   No current facility-administered medications for this visit.      Vitals:   04/11/19 1312  BP: 121/89  Pulse: 88  Resp: 20  Temp: 97.9 F (36.6 C)   There were no vitals filed for this visit.  Physical Exam  Constitutional: He is oriented to person, place, and time and well-developed, well-nourished, and in no distress.  HENT:  Head: Normocephalic and atraumatic.  Mouth/Throat: Oropharynx is clear and moist. No oropharyngeal exudate.  Eyes: Pupils are equal, round, and reactive to light.  Cardiovascular: Normal rate and regular rhythm.  Pulmonary/Chest: Effort normal. No respiratory distress. He has no  wheezes.  Abdominal: Soft. Bowel sounds are normal. He exhibits no distension and no mass. There is no abdominal tenderness. There is no rebound and no guarding.  Musculoskeletal:        General: No tenderness or edema. Normal range of motion.     Cervical back: Normal range of motion and neck supple.  Neurological: He is alert and oriented to person, place, and time.  Skin: Skin is warm.  Folliculitis like lesions noted on the scalp.  Psychiatric: Affect normal.    LABORATORY DATA:  I have reviewed the data as listed Lab Results  Component Value Date   WBC 7.9 04/11/2019   HGB 15.9 04/11/2019   HCT 44.4 04/11/2019   MCV 87.7 04/11/2019   PLT 434 (H) 04/11/2019   Recent Labs    09/06/18 0000 09/17/18 0000 12/22/18 0000 01/24/19 1106 04/11/19 1254  NA 138   < > 141 138 139  K 4.6   < > 4.6 5.2 4.1  CL  --    < >  102 102 103  CO2  --    < > '28 28 25  '$ GLUCOSE  --    < > 73 85 97  BUN 13   < > '15 11 13  '$ CREATININE 1.0   < > 1.08 1.17 1.20  CALCIUM  --    < > 11.4* 10.2 9.5  GFRNONAA  --    < > 88 80 >60  GFRAA  --    < > 103 93 >60  PROT  --    < > 7.4 7.1 8.0  ALBUMIN  --   --   --   --  4.4  AST 31   < > 35 34 63*  ALT 59*   < > 134* 71* 111*  ALKPHOS 155*  --   --   --  77  BILITOT  --    < > 0.3 0.5 0.7   < > = values in this interval not displayed.     No results found.  ASSESSMENT & PLAN:   Thrombocytosis ([place]) # Thrombocytosis -likely reactive [secondary inflammation hidradenitis]- jak 2 BCR ABL; MPL; CALR mutation negative on peripheral blood.  Platelets today are 434; trending down [see below].  Would not recommend any further work-up or bone marrow biopsy.  #Hidradenitis-status post evaluation with dermatology currently on Humira.  Clinically seems to be improving.   # DISPOSITION:  # Follow up as needed- Dr.B  Cc; PCP      Cammie Sickle, MD 04/11/2019 2:20 PM

## 2019-04-12 NOTE — Progress Notes (Signed)
[name]-patient's platelets are likely from inflammation/hidradenitis.  I would defer to you for further follow-up regarding slightly elevated LFTs.  Patient will follow up with me only as needed.  Thank you GB --------------------------------------------------------------------------------------------- Hi [name]-your blood work done yesterday as discussed showed the platelets improving-likely from inflammation.  However the liver numbers are slightly elevated [they have previously been elevated]-etiology is not clear to me.  Looks like you had work-up done for elevated liver numbers in the past.  I would recommend continued follow-up with the PCP.  Please call us if you have questions Thanks, Dr.B

## 2019-04-15 MED FILL — HUMIRA PEN CITRATE FREE 40 MG/0.4 ML: SUBCUTANEOUS | 28 days supply | Qty: 8 | Fill #1

## 2019-04-15 MED FILL — HUMIRA PEN CITRATE FREE 40 MG/0.4 ML: 28 days supply | Qty: 8 | Fill #1 | Status: AC

## 2019-04-16 ENCOUNTER — Other Ambulatory Visit: Payer: Self-pay | Admitting: Family Medicine

## 2019-04-26 DIAGNOSIS — Z23 Encounter for immunization: Secondary | ICD-10-CM | POA: Diagnosis not present

## 2019-05-10 NOTE — Unmapped (Signed)
[facility] Refill Coordination Note      Specialty Medication(s) to be Shipped:   Inflammatory Disorders: Humira    Other medication(s) to be shipped: n/a     Reginald Conway, DOB: 05-20-1985  Phone: [phone] (home)       All above HIPAA information was verified with patient.     Was a Nurse, learning disability used for this call? No    Completed refill call assessment today to schedule patient's medication shipment from the [facility] [phone]).       Specialty medication(s) and dose(s) confirmed: Regimen is correct and unchanged.   Changes to medications: Reginald Conway reports no changes at this time.  Changes to insurance: No  Questions for the pharmacist: No    Confirmed patient received Welcome Packet with first shipment. The patient will receive a drug information handout for each medication shipped and additional FDA Medication Guides as required.       DISEASE/MEDICATION-SPECIFIC INFORMATION        For patients on injectable medications: Patient currently has 1 doses left.  Next injection is scheduled for 05/10/2019.    SPECIALTY MEDICATION ADHERENCE     Medication Adherence    Patient reported X missed doses in the last month: 0  Specialty Medication: humira 40mg /0.70ml  Patient is on additional specialty medications: No  Any gaps in refill history greater than 2 weeks in the last 3 months: no  Demonstrates understanding of importance of adherence: yes  Informant: patient  Reliability of informant: reliable  Confirmed plan for next specialty medication refill: delivery by pharmacy  Refills needed for supportive medications: not needed                Humira CF 40 mg/0.4 ml. 7 days on hand      SHIPPING     Shipping address confirmed in Epic.     Delivery Scheduled: Yes, Expected medication delivery date: 05/11/2019.     Medication will be delivered via Same Day Courier to the prescription address in Epic WAM.    Reginald Conway D Anner Baity   [facility] Specialty Technician

## 2019-05-11 MED FILL — HUMIRA PEN CITRATE FREE 40 MG/0.4 ML: 28 days supply | Qty: 8 | Fill #2 | Status: AC

## 2019-05-11 MED FILL — HUMIRA PEN CITRATE FREE 40 MG/0.4 ML: SUBCUTANEOUS | 28 days supply | Qty: 8 | Fill #2

## 2019-05-24 ENCOUNTER — Other Ambulatory Visit: Payer: Self-pay

## 2019-05-24 ENCOUNTER — Encounter: Payer: Self-pay | Admitting: Family Medicine

## 2019-05-24 ENCOUNTER — Ambulatory Visit: Payer: Medicaid Other | Admitting: Family Medicine

## 2019-05-24 VITALS — BP 122/84 | HR 96 | Temp 98.1°F | Resp 14 | Ht 69.0 in | Wt 222.5 lb

## 2019-05-24 DIAGNOSIS — M791 Myalgia, unspecified site: Secondary | ICD-10-CM | POA: Diagnosis not present

## 2019-05-24 DIAGNOSIS — E781 Pure hyperglyceridemia: Secondary | ICD-10-CM | POA: Diagnosis not present

## 2019-05-24 DIAGNOSIS — F988 Other specified behavioral and emotional disorders with onset usually occurring in childhood and adolescence: Secondary | ICD-10-CM

## 2019-05-24 DIAGNOSIS — J310 Chronic rhinitis: Secondary | ICD-10-CM | POA: Diagnosis not present

## 2019-05-24 DIAGNOSIS — Z79899 Other long term (current) drug therapy: Secondary | ICD-10-CM | POA: Diagnosis not present

## 2019-05-24 DIAGNOSIS — D84821 Immunodeficiency due to drugs: Secondary | ICD-10-CM | POA: Diagnosis not present

## 2019-05-24 DIAGNOSIS — F419 Anxiety disorder, unspecified: Secondary | ICD-10-CM | POA: Diagnosis not present

## 2019-05-24 DIAGNOSIS — Z5181 Encounter for therapeutic drug level monitoring: Secondary | ICD-10-CM | POA: Diagnosis not present

## 2019-05-24 DIAGNOSIS — E785 Hyperlipidemia, unspecified: Secondary | ICD-10-CM

## 2019-05-24 DIAGNOSIS — R7989 Other specified abnormal findings of blood chemistry: Secondary | ICD-10-CM

## 2019-05-24 DIAGNOSIS — T466X5A Adverse effect of antihyperlipidemic and antiarteriosclerotic drugs, initial encounter: Secondary | ICD-10-CM

## 2019-05-24 LAB — LIPID PANEL
Cholesterol: 282 mg/dL — ABNORMAL HIGH (ref <200)
HDL: 47 mg/dL (ref >=40)
Non-HDL Cholesterol (Calc): 235 mg/dL (calc) — ABNORMAL HIGH (ref <130)
Total CHOL/HDL Ratio: 6 (calc) — ABNORMAL HIGH (ref <5.0)
Triglycerides: 563 mg/dL — ABNORMAL HIGH (ref <150)

## 2019-05-24 LAB — COMPLETE METABOLIC PANEL WITH GFR
AG Ratio: 1.6 (calc) (ref 1.0–2.5)
ALT: 91 U/L — ABNORMAL HIGH (ref 9–46)
AST: 41 U/L — ABNORMAL HIGH (ref 10–40)
Albumin: 4.6 g/dL (ref 3.6–5.1)
Alkaline phosphatase (APISO): 89 U/L (ref 36–130)
BUN: 13 mg/dL (ref 7–25)
CO2: 28 mmol/L (ref 20–32)
Calcium: 10.5 mg/dL — ABNORMAL HIGH (ref 8.6–10.3)
Chloride: 101 mmol/L (ref 98–110)
Creat: 1.08 mg/dL (ref 0.60–1.35)
GFR, Est African American: 103 mL/min/{1.73_m2} (ref >=60)
GFR, Est Non African American: 88 mL/min/{1.73_m2} (ref >=60)
Globulin: 2.9 g/dL (calc) (ref 1.9–3.7)
Glucose, Bld: 77 mg/dL (ref 65–99)
Potassium: 4.8 mmol/L (ref 3.5–5.3)
Sodium: 139 mmol/L (ref 135–146)
Total Bilirubin: 0.6 mg/dL (ref 0.2–1.2)
Total Protein: 7.5 g/dL (ref 6.1–8.1)

## 2019-05-24 LAB — CBC WITH DIFFERENTIAL/PLATELET
Absolute Monocytes: 1195 cells/uL — ABNORMAL HIGH (ref 200–950)
Basophils Absolute: 125 cells/uL (ref 0–200)
Basophils Relative: 0.9 %
Eosinophils Absolute: 209 cells/uL (ref 15–500)
Eosinophils Relative: 1.5 %
HCT: 46.8 % (ref 38.5–50.0)
Hemoglobin: 16.2 g/dL (ref 13.2–17.1)
Lymphs Abs: 5796 cells/uL — ABNORMAL HIGH (ref 850–3900)
MCH: 32 pg (ref 27.0–33.0)
MCHC: 34.6 g/dL (ref 32.0–36.0)
MCV: 92.3 fL (ref 80.0–100.0)
MPV: 9.5 fL (ref 7.5–12.5)
Monocytes Relative: 8.6 %
Neutro Abs: 6575 cells/uL (ref 1500–7800)
Neutrophils Relative %: 47.3 %
Platelets: 495 10*3/uL — ABNORMAL HIGH (ref 140–400)
RBC: 5.07 10*6/uL (ref 4.20–5.80)
RDW: 12.7 % (ref 11.0–15.0)
Total Lymphocyte: 41.7 %
WBC: 13.9 10*3/uL — ABNORMAL HIGH (ref 3.8–10.8)

## 2019-05-24 MED ORDER — FLUTICASONE PROPIONATE 50 MCG/ACT NA SUSP: 2.0000 | Freq: Every day | NASAL | 11 refills | Status: DC

## 2019-05-24 NOTE — Progress Notes (Signed)
Name: Mario Boyd   MRN: [id number]    DOB: 06/03/1985   Date:05/24/2019       Progress Note  Chief Complaint  Patient presents with  . Follow-up  . Hyperlipidemia     Subjective:   Mario Boyd is a 34 y.o. male, presents to clinic for routine follow up on the conditions listed above.  F/up LFTs elevated and worsening over the past 6 months  Lab Results  Component Value Date   ALT 111 (H) 04/11/2019   AST 63 (H) 04/11/2019   ALKPHOS 77 04/11/2019   BILITOT 0.7 04/11/2019    Cholesterol and triglycerides elevated, likely familial, improved on 20 mg lipitor, but unfortunately it was causing leg pain/joint pain and weakness, which pt reported after several months.  When he did briefly mention that he was being seen for sinus symptoms.  We did have him hold the medication and checked some basic blood work, CK was normal.  Pts sx improved when he held the statin, he did not restart it.  Pt with chronic sinus issues, he has had  multiple infections since establishing care here, at increased risk after starting humira for HS/dissecting cellulitis.  When treated with antibiotics for 2 to 3 weeks at a time he has had some improvement in symptoms but generally has chronic sinus issues which have recently worsened. ENT referral was placed in Jan 2021, but denied due to insurance card PCP was not updated.  Pt changed PCP this month and hopes to have updated card and will refer again once he gets it.  He states it has been bad since her last visit, currently not having any severe facial pain, lymphadenopathy, headaches but he is congested and has nasal drainage, usually he has postnasal drip and some throat irritation.  Currently does not feel sick with a sinus infection or worsening symptoms   HS - [facility] derm increased dose of humira 80 mg injection weekly- they did initial labs 09/2018, none since  Patient nervously brings up at the end of the visit today that he would like to come back and  talk about anxiety sx and ADHD. He reports that his family numbers and his wife have encouraged him to come in to talk about these things he states it has become problematic in his life and to them.  Anxiety is newer and symptoms have been worsening gradually.  He reports a past medical history of diagnosis of ADD but he was not on medications Pt not working right now, impulsive at work takes on too much and then he quits.  Getting bored easily and moving on quickly has been a pattern for him but he has a lot of experience in what he does and he does not think it has been a detriment professionally he feels that he is able to get a job and do a job well.  He does find that he is easily distracted and overstimulated by things around him. Anxiety, some aggitation, he feels like its hard to go do things with his family - he gets really anxious and can't go to the park with his family.  overstiumulated Used to be a "social butterfly", now extremely anxious.  He went to the zoo with his family and just with that he could not function but everything around him and anxiety and worry and distraction.  He states that for example coming to the appointment today he was anxious and panicked all night and yesterday was unable to sleep.  Does have insomnia which is also newer despite taking melatonin.  He replaced everything that happened all day long through his mind does tend to over analyze situations or conversations.  Every day normal things now will cause him to worry excessively, over analyze, panic, have difficulty unwinding or going to sleep, was not previously like this.       Patient Active Problem List   Diagnosis Date Noted  . Immunocompromised state due to drug therapy 01/24/2019  . Cervical lymphadenopathy 01/24/2019  . Chronic rhinitis 01/24/2019  . Pansinusitis 01/24/2019  . Class 1 obesity with serious comorbidity and body mass index (BMI) of 31.0 to 31.9 in adult 09/17/2018  . Elevated LFTs  09/17/2018  . Hypertriglyceridemia 09/17/2018  . History of nephrectomy, right 09/17/2018  . Hidradenitis suppurativa 09/17/2018  . Thrombocytosis ([place]) 09/17/2018    Past Surgical History:  Procedure Laterality Date  . HERNIA REPAIR    . KIDNEY SURGERY    . NEPHRECTOMY LIVING DONOR Right 2009  . TONSILLECTOMY    . TYMPANOSTOMY TUBE PLACEMENT      Family History  Problem Relation Age of Onset  . Hyperlipidemia Mother   . Hypertension Mother   . Other Mother        thrombocytosis  . Hyperlipidemia Father   . Hypertension Father   . Epilepsy Sister   . Hypertension Sister   . Epilepsy Son   . Heart failure Maternal Grandmother   . Diabetes Maternal Grandfather   . Heart failure Maternal Grandfather   . Other Maternal Grandfather        thrombocytosis  . Diabetes Paternal Grandmother   . Hypertension Paternal Grandfather   . Prostate cancer Paternal Grandfather     Social History   Tobacco Use  . Smoking status: Former Smoker    Packs/day: 0.50    Years: 15.00    Pack years: 7.50    Types: Cigarettes  . Smokeless tobacco: Never Used  Substance Use Topics  . Alcohol use: Never  . Drug use: Never      Current Outpatient Medications:  .  Adalimumab (HUMIRA PEN) 40 MG/0.4ML PNKT, Inject 80 mg into the skin daily., Disp: , Rfl:  .  fluticasone (FLONASE) 50 MCG/ACT nasal spray, Place 2 sprays into both nostrils daily., Disp: 16 g, Rfl: 2 .  montelukast (SINGULAIR) 10 MG tablet, TAKE 1 TABLET BY MOUTH EVERYDAY AT BEDTIME, Disp: 90 tablet, Rfl: 1 .  atorvastatin (LIPITOR) 20 MG tablet, TAKE 1 TABLET BY MOUTH EVERY DAY (Patient not taking: Reported on 05/24/2019), Disp: 30 tablet, Rfl: 1 .  levocetirizine (XYZAL) 5 MG tablet, Take 1 tablet (5 mg total) by mouth every evening. (Patient not taking: Reported on 05/24/2019), Disp: 30 tablet, Rfl: 2  No Known Allergies  Chart Review Today: I personally reviewed active problem list, medication list, allergies, family  history, social history, health maintenance, notes from last encounter, lab results, imaging with the patient/caregiver today.   Review of Systems  10 Systems reviewed and are negative for acute change except as noted in the HPI.  Objective:    Vitals:   05/24/19 1029  BP: 122/84  Pulse: 96  Resp: 14  Temp: 98.1 F (36.7 C)  SpO2: 96%  Weight: 222 lb 8 oz (100.9 kg)  Height: 5\' 9"  (1.753 m)    Body mass index is 32.86 kg/m.  Physical Exam Vitals and nursing note reviewed.  Constitutional:      General: He is not in acute distress.  Appearance: Normal appearance. He is not ill-appearing, toxic-appearing or diaphoretic.  HENT:     Head: Normocephalic and atraumatic.     Right Ear: External ear normal.     Left Ear: External ear normal.  Eyes:     General: No scleral icterus.       Right eye: No discharge.        Left eye: No discharge.     Conjunctiva/sclera: Conjunctivae normal.  Cardiovascular:     Rate and Rhythm: Normal rate and regular rhythm.     Pulses: Normal pulses.     Heart sounds: Normal heart sounds. No murmur. No friction rub. No gallop.   Pulmonary:     Effort: Pulmonary effort is normal. No respiratory distress.     Breath sounds: Normal breath sounds. No stridor. No wheezing, rhonchi or rales.  Abdominal:     General: Bowel sounds are normal. There is no distension.     Palpations: Abdomen is soft.  Musculoskeletal:     Right lower leg: No edema.     Left lower leg: No edema.  Skin:    Coloration: Skin is not jaundiced or pale.  Psychiatric:        Attention and Perception: Attention normal.        Mood and Affect: Mood is anxious. Mood is not depressed.        Speech: Speech normal.        Behavior: Behavior normal. Behavior is cooperative.        Thought Content: Thought content normal.       PHQ2/9: Depression screen [facility] 2/9 05/24/2019 01/24/2019 12/17/2018 09/17/2018  Decreased Interest 0 0 0 0  Down, Depressed, Hopeless 0 0 0 0    PHQ - 2 Score 0 0 0 0  Altered sleeping 0 0 0 0  Tired, decreased energy 0 0 0 0  Change in appetite 0 0 0 0  Feeling bad or failure about yourself  0 0 0 0  Trouble concentrating 0 0 0 0  Moving slowly or fidgety/restless 0 0 0 0  Suicidal thoughts 0 0 0 0  PHQ-9 Score 0 0 0 0  Difficult doing work/chores Not difficult at all Not difficult at all Not difficult at all Not difficult at all    phq 9 is neg  Fall Risk: Fall Risk  05/24/2019 01/24/2019 12/17/2018 09/17/2018  Falls in the past year? 0 0 0 0  Number falls in past yr: 0 0 0 0  Injury with Fall? 0 0 0 0    Functional Status Survey: Is the patient deaf or have difficulty hearing?: No Does the patient have difficulty seeing, even when wearing glasses/contacts?: No Does the patient have difficulty concentrating, remembering, or making decisions?: No Does the patient have difficulty walking or climbing stairs?: No Does the patient have difficulty dressing or bathing?: No Does the patient have difficulty doing errands alone such as visiting a doctor's office or shopping?: No   Assessment & Plan:     ICD-10-CM   1. Elevated LFTs  R79.89 CBC with Differential/Platelet    Lipid panel    COMPLETE METABOLIC PANEL WITH GFR   LFTs increased again, recheck labs  2. Hypertriglyceridemia  E78.1 Lipid panel    COMPLETE METABOLIC PANEL WITH GFR   Very high triglycerides not currently on meds or working on any specific diet, discussed trying fenofibrate versus other statin  3. Hyperlipidemia, unspecified hyperlipidemia type  E78.5 Lipid panel    COMPLETE METABOLIC  PANEL WITH GFR   Likely familial, will recheck lipid panel and CMP, patient willing to try other medications did not tolerate Lipitor 20 mg  4. Immunocompromised state due to drug therapy  D84.821 CBC with Differential/Platelet   Z79.899 Lipid panel    COMPLETE METABOLIC PANEL WITH GFR   On Humira dose increased about 2 months ago  5. Myalgia due to statin  M79.10     [id number]    Had some discomfort and weakness after being on Lipitor for few months patient is willing to try other statin medications  6. Chronic rhinitis  J31.0 fluticasone (FLONASE) 50 MCG/ACT nasal spray   Chronic and worse with allergy seasons, on Allegra over-the-counter, Singulair, will need to order ENT referral again once insurance card is fixed  7. Encounter for medication monitoring  Z51.81 CBC with Differential/Platelet    Lipid panel    COMPLETE METABOLIC PANEL WITH GFR  8. Anxiety disorder, unspecified type  F41.9    Patient does have severe daily anxiety symptoms discussed coping techniques, likely benefit from SSRI/SNRI and therapy - f/up visit to discuss further   9. Attention deficit disorder, unspecified hyperactivity presence  F98.8    Pt reports prior assessment and diagnosis, he is very easily distracted and overstimulated by things around him easily bored, overlaps with anxiety   When I was leaving the exam room after reviewing all of his labs medications and the plan today -I asked to be any other questions or concerns and he hesitantly brought up his wife and family concerns of getting a office visit or evaluation for anxiety and for ADD we discussed for quite a while today but I did ask him to come back as soon as he could schedule another office visit to dedicate to further evaluation of his symptoms concerns and past diagnoses.  Would like to do some of the anxiety screening and ADHD screening questions and self evaluation.  I did encourage him today to call his insurance company and to start working on getting established with a therapist or getting into therapy through a psychiatrist office, medications for anxiety and for ADD will likely be helpful but I explained to him that cognitive behavioral therapy today again to why he is so triggered why he suddenly become so anxious will be very helpful to get him improved symptoms  F/up ASAP for anxiety and ADD  F/up in 3-4  months for HLD/trigs/med changes  Encouraged pt to notify us when he gets new insurance card because we will need to put in another ENT referral  Greater than 50% of this visit was spent in direct face-to-face counseling, obtaining history and physical, discussing and educating pt on treatment plan.  Total time of this visit was 50+ min.  Remainder of time involved but was not limited to reviewing chart (recent and pertinent OV notes and labs), documentation in EMR, and coordinating care and treatment plan. I was with pt 10:35- 11:10 due to additional complaints when I was done with OV, he brought up anxiety sx, which were severe and daily, in addition to ADD and we sat and talked about his concerns and sx for another 10-15 min, will have him return to do further eval and to determine meds to start- once labs are resulted.    Delsa Grana, PA-C 05/24/19 10:35 AM

## 2019-05-24 NOTE — Patient Instructions (Signed)
Call your insurance card mental health or behavioral health to get to a therapist or psychiatry office in your area accepting new patients  Get a f/up appointment here and we can go over your labs and do some more assessment for ADHD/ADD and anxiety and start some meds

## 2019-05-25 MED ORDER — ROSUVASTATIN CALCIUM 5 MG PO TABS: ORAL_TABLET | ORAL | 3 refills | Status: DC

## 2019-05-25 MED ORDER — EZETIMIBE 10 MG PO TABS: 10.0000 mg | ORAL_TABLET | Freq: Every day | ORAL | 3 refills | Status: DC

## 2019-05-25 NOTE — Addendum Note (Signed)
Addended by: Delsa Grana on: 05/25/2019 09:12 AM   Modules accepted: Orders

## 2019-05-26 ENCOUNTER — Telehealth: Payer: Self-pay | Admitting: Family Medicine

## 2019-05-26 NOTE — Telephone Encounter (Signed)
Call pt back with results

## 2019-05-26 NOTE — Telephone Encounter (Signed)
General/Other - Lab results  Patient returned the call that he missed in reference to his lab results. Please call him back. Please advise

## 2019-05-27 NOTE — Telephone Encounter (Signed)
Pt.notified

## 2019-06-08 NOTE — Unmapped (Signed)
[facility] Refill Coordination Note    Specialty Medication(s) to be Shipped:   Inflammatory Disorders: Humira    Other medication(s) to be shipped: none     Reginald Conway, DOB: 1985/01/12  Phone: [phone] (home)       All above HIPAA information was verified with patient.     Was a Nurse, learning disability used for this call? No    Completed refill call assessment today to schedule patient's medication shipment from the [facility] [phone]).       Specialty medication(s) and dose(s) confirmed: Regimen is correct and unchanged.   Changes to medications: Reginald Conway reports no changes at this time.  Changes to insurance: No  Questions for the pharmacist: No    Confirmed patient received Welcome Packet with first shipment. The patient will receive a drug information handout for each medication shipped and additional FDA Medication Guides as required.       DISEASE/MEDICATION-SPECIFIC INFORMATION        N/A    SPECIALTY MEDICATION ADHERENCE     Medication Adherence    Patient reported X missed doses in the last month: 0  Specialty Medication: Humira CF 40 mg/0.4 ml   Patient is on additional specialty medications: No              HUMIRA: 1 dose of medication on hand.        SHIPPING     Shipping address confirmed in Epic.     Delivery Scheduled: Yes, Expected medication delivery date: 06/14/19.     Medication will be delivered via Same Day Courier to the prescription address in Epic WAM.    [place] A Avital Dancy   [facility] Specialty Technician

## 2019-06-14 MED FILL — HUMIRA PEN CITRATE FREE 40 MG/0.4 ML: SUBCUTANEOUS | 28 days supply | Qty: 8 | Fill #3

## 2019-06-14 MED FILL — HUMIRA PEN CITRATE FREE 40 MG/0.4 ML: 28 days supply | Qty: 8 | Fill #3 | Status: AC

## 2019-06-21 ENCOUNTER — Encounter: Payer: Self-pay | Admitting: Family Medicine

## 2019-06-21 ENCOUNTER — Other Ambulatory Visit: Payer: Self-pay

## 2019-06-21 ENCOUNTER — Ambulatory Visit: Payer: Medicaid Other | Admitting: Family Medicine

## 2019-06-21 VITALS — BP 122/78 | HR 87 | Temp 97.1°F | Resp 18 | Ht 69.0 in | Wt 222.1 lb

## 2019-06-21 DIAGNOSIS — F909 Attention-deficit hyperactivity disorder, unspecified type: Secondary | ICD-10-CM

## 2019-06-21 DIAGNOSIS — F419 Anxiety disorder, unspecified: Secondary | ICD-10-CM

## 2019-06-21 MED ORDER — BUSPIRONE HCL 5 MG PO TABS: 5.0000 mg | ORAL_TABLET | Freq: Three times a day (TID) | ORAL | 1 refills | Status: DC | PRN

## 2019-06-21 MED ORDER — CITALOPRAM HYDROBROMIDE 20 MG PO TABS: ORAL_TABLET | ORAL | 1 refills | Status: DC

## 2019-06-21 NOTE — Progress Notes (Signed)
Patient ID: Mario Boyd, male    DOB: 1985/02/06, 34 y.o.   MRN: [id number]  PCP: Delsa Grana, PA-C  Chief Complaint  Patient presents with  . Anxiety  . ADHD    Subjective:   Mario Boyd is a 34 y.o. male, presents to clinic with CC of the following:  HPI    Patient presents to discuss his anxiety symptoms, which he briefly mentioned at the end of last routine OV: On 05/24/2019 he was encouraged to follow-up with a separate appointment to address further  "Patient nervously brings up at the end of the visit today that he would like to come back and talk about anxiety sx and ADHD. He reports that his family numbers and his wife have encouraged him to come in to talk about these things he states it has become problematic in his life and to them.  Anxiety is newer and symptoms have been worsening gradually.  He reports a past medical history of diagnosis of ADD but he was not on medications Pt not working right now, impulsive at work takes on too much and then he quits.  Getting bored easily and moving on quickly has been a pattern for him but he has a lot of experience in what he does and he does not think it has been a detriment professionally he feels that he is able to get a job and do a job well.  He does find that he is easily distracted and overstimulated by things around him. Anxiety, some aggitation, he feels like its hard to go do things with his family - he gets really anxious and can't go to the park with his family.  overstiumulated Used to be a "social butterfly", now extremely anxious.  He went to the zoo with his family and just with that he could not function but everything around him and anxiety and worry and distraction.  He states that for example coming to the appointment today he was anxious and panicked all night and yesterday was unable to sleep.  Does have insomnia which is also newer despite taking melatonin.  He replaced everything that happened all day long  through his mind does tend to over analyze situations or conversations.  Every day normal things now will cause him to worry excessively, over analyze, panic, have difficulty unwinding or going to sleep, was not previously like this."  GAD 7 : Generalized Anxiety Score 06/21/2019  Nervous, Anxious, on Edge 3  Control/stop worrying 2  Worry too much - different things 2  Trouble relaxing 3  Restless 3  Easily annoyed or irritable 2  Afraid - awful might happen 3  Total GAD 7 Score 18  Anxiety Difficulty Extremely difficult   Pt GAD7 score reviewed  His family is going out of town for a few weeks and pt is extremely anxious about having to go to the store to buy groceries.  He's never been on meds before, he tried to find out what psych or counselor he could go to - his insurance changes in a few weeks and he has not been able to access the new insurance info or website.  He wants to wait on referral until after July 1st Willing to try meds in the meantime.  Hx of ADHD, dx when he was in middle school.  Anxiety is affecting him much moe than ADHD  Adult ADHD Self-Report Scale (ASRS-v1.1) Symptom Checklist  Part A 1. How often do you have trouble wrapping  up the final details of a project, once the challenging parts have been done?: (!) Often 2. How often do you have difficulty getting things done in order when you have to do a task that requires organization?: (!) Often 3. How often do you have problems remembering appointments or obligations?: (!) Very Often 4. When you have a task that requires a lot of thought, how often do you avoid or delay getting started?: (!) Very Often 5. How often do you fidget or squirm with your hands or feet when you have to sit down for a long time?: (!) Very Often 6. How often do you feel overly active and compelled to do things, like you were driven by a motor?: (!) Often  Part B 7. How often do you make careless mistakes when you have to work on a boring  or difficult project?: (!) Often 8. How often do you have difficulty keeping your attention when you are doing boring or repetitive work?: (!) Very Often 9. How often do you have difficulty concentrating on what people say to you, even when they are speaking to you directly?: (!) Very Often 10. How often do you misplace or have difficulty finding things at home or at work?: (!) Often 11. How often are you distracted by activity or noise around you?: (!) Very Often 12. How often do you leave your seat in meetings or other situations in which you are expected to remain seated?: (!) Often 13. How often do you feel restless or fidgety?: (!) Very Often 14. How often do you have difficulty unwinding and relaxing when you have time to yourself?: Sometimes 15. How often do you find yourself talking too much when you are in social situations?: (!) Very Often 16. When you are in a conversation, how often do you find yourself finishing the sentences of the people you are talking to, before they can finish them themselves?: (!) Very Often 17. How often do you have difficulty waiting your turn in situations when turn taking is required?: Sometimes 18. How often do you interrupt others when they are busy?: (!) Sometimes How old were you when these problems first began to occur?: 10     Patient Active Problem List   Diagnosis Date Noted  . Hyperlipidemia 05/24/2019  . Myalgia due to statin 05/24/2019  . Immunocompromised state due to drug therapy 01/24/2019  . Cervical lymphadenopathy 01/24/2019  . Chronic rhinitis 01/24/2019  . Pansinusitis 01/24/2019  . Class 1 obesity with serious comorbidity and body mass index (BMI) of 31.0 to 31.9 in adult 09/17/2018  . Elevated LFTs 09/17/2018  . Hypertriglyceridemia 09/17/2018  . History of nephrectomy, right 09/17/2018  . Hidradenitis suppurativa 09/17/2018  . Thrombocytosis ([place]) 09/17/2018      Current Outpatient Medications:  .  Adalimumab (HUMIRA  PEN) 40 MG/0.4ML PNKT, Inject 80 mg into the skin daily., Disp: , Rfl:  .  ezetimibe (ZETIA) 10 MG tablet, Take 1 tablet (10 mg total) by mouth daily., Disp: 90 tablet, Rfl: 3 .  fluticasone (FLONASE) 50 MCG/ACT nasal spray, Place 2 sprays into both nostrils daily., Disp: 16 g, Rfl: 11 .  montelukast (SINGULAIR) 10 MG tablet, TAKE 1 TABLET BY MOUTH EVERYDAY AT BEDTIME, Disp: 90 tablet, Rfl: 1 .  rosuvastatin (CRESTOR) 5 MG tablet, Take 5 mg tablet PO at bedtime 3x a week, Disp: 36 tablet, Rfl: 3   No Known Allergies   Family History  Problem Relation Age of Onset  . Hyperlipidemia Mother   .  Hypertension Mother   . Other Mother        thrombocytosis  . Hyperlipidemia Father   . Hypertension Father   . Epilepsy Sister   . Hypertension Sister   . Epilepsy Son   . Heart failure Maternal Grandmother   . Diabetes Maternal Grandfather   . Heart failure Maternal Grandfather   . Other Maternal Grandfather        thrombocytosis  . Diabetes Paternal Grandmother   . Hypertension Paternal Grandfather   . Prostate cancer Paternal Grandfather      Social History   Socioeconomic History  . Marital status: Married    Spouse name: alice  . Number of children: 2  . Years of education: 59  . Highest education level: Associate degree: occupational, Hotel manager, or vocational program  Occupational History  . Occupation: unemployed  Tobacco Use  . Smoking status: Former Smoker    Packs/day: 0.50    Years: 15.00    Pack years: 7.50    Types: Cigarettes  . Smokeless tobacco: Never Used  Vaping Use  . Vaping Use: Every day  . Substances: Nicotine, Flavoring  Substance and Sexual Activity  . Alcohol use: Never  . Drug use: Never  . Sexual activity: Yes    Comment: wife on birth control, 9 years  Other Topics Concern  . Not on file  Social History Narrative   Vape; quit smoking; no alcohol; works at [employer]; in [place] 2 children.    Social Determinants of Health    Financial Resource Strain: Low Risk   . Difficulty of Paying Living Expenses: Not hard at all  Food Insecurity: No Food Insecurity  . Worried About Charity fundraiser in the Last Year: Never true  . Ran Out of Food in the Last Year: Never true  Transportation Needs: No Transportation Needs  . Lack of Transportation (Medical): No  . Lack of Transportation (Non-Medical): No  Physical Activity: Inactive  . Days of Exercise per Week: 0 days  . Minutes of Exercise per Session: 0 min  Stress: No Stress Concern Present  . Feeling of Stress : Not at all  Social Connections: Socially Isolated  . Frequency of Communication with Friends and Family: Once a week  . Frequency of Social Gatherings with Friends and Family: Once a week  . Attends Religious Services: Never  . Active Member of Clubs or Organizations: No  . Attends Archivist Meetings: Never  . Marital Status: Married  Human resources officer Violence: Not At Risk  . Fear of Current or Ex-Partner: No  . Emotionally Abused: No  . Physically Abused: No  . Sexually Abused: No    Chart Review Today: I personally reviewed active problem list, medication list, allergies, family history, social history, health maintenance, notes from last encounter, lab results, imaging with the patient/caregiver today.   Review of Systems 10 Systems reviewed and are negative for acute change except as noted in the HPI.     Objective:   Vitals:   06/21/19 1003  BP: 122/78  Pulse: 87  Resp: 18  Temp: (!) 97.1 F (36.2 C)  TempSrc: Temporal  SpO2: 97%  Weight: 222 lb 1.6 oz (100.7 kg)  Height: 5\' 9"  (1.753 m)    Body mass index is 32.8 kg/m.  Physical Exam Vitals and nursing note reviewed.  Constitutional:      General: He is not in acute distress.    Appearance: Normal appearance. He is well-developed. He is not ill-appearing, toxic-appearing  or diaphoretic.  HENT:     Head: Normocephalic and atraumatic.     Right Ear: External  ear normal.     Left Ear: External ear normal.     Nose: Nose normal.  Eyes:     General:        Right eye: No discharge.        Left eye: No discharge.     Conjunctiva/sclera: Conjunctivae normal.  Neck:     Trachea: No tracheal deviation.  Pulmonary:     Effort: Pulmonary effort is normal. No respiratory distress.     Breath sounds: No stridor.  Skin:    Coloration: Skin is not jaundiced or pale.     Findings: No rash.  Neurological:     Mental Status: He is alert.  Psychiatric:        Attention and Perception: Attention normal.        Mood and Affect: Mood is anxious.        Speech: Speech normal.        Behavior: Behavior normal. Behavior is cooperative.            Assessment & Plan:      ICD-10-CM   1. Anxiety disorder, unspecified type  F41.9 citalopram (CELEXA) 20 MG tablet    busPIRone (BUSPAR) 5 MG tablet   start celexa, can use buspar PRN, will still need to establish with psych/therapy due to severity of sx.    2. Attention deficit hyperactivity disorder (ADHD), unspecified ADHD type  F90.9    very symptomatic, will start treating anxiety first and see if he will also need to start tx for ADHD (currently not working)     Start celexa 10 mg daily for 1-2 weeks then increase to 20 mg daily.  Buspar 5-15 mg up to TID prn If you have patient's extreme symptoms he will need a therapist and a psychiatrist to help him with getting appropriate medication management and targeted therapies to work on underlying triggers and past issues.  Patient does not want to put in a referral until his new insurance is active after July 1. I do not feel we should wait for medications until that time so we will start meds closely follow-up.  Patient was encouraged to send a message after July 1 regarding any preferred in network providers that he would like to see  For right now we will start treating anxiety since the symptoms are significantly impacting his life.  His ADHD is not  currently an issue due to being unemployed, later if he has significant hardships due to untreated ADHD we will restart medicines than when the benefit outweighs the risk  I've explained to him that drugs of the SSRI class can have side effects such as weight gain, sexual dysfunction, insomnia, headache, nausea. These medications are generally effective at alleviating symptoms of anxiety and/or depression. Let me know if significant side effects do occur.    Delsa Grana, PA-C 06/21/19 10:24 AM

## 2019-06-21 NOTE — Patient Instructions (Signed)
Managing Anxiety, Adult After being diagnosed with an anxiety disorder, you may be relieved to know why you have felt or behaved a certain way. You may also feel overwhelmed about the treatment ahead and what it will mean for your life. With care and support, you can manage this condition and recover from it. How to manage lifestyle changes Managing stress and anxiety  Stress is your body's reaction to life changes and events, both good and bad. Most stress will last just a few hours, but stress can be ongoing and can lead to more than just stress. Although stress can play a major role in anxiety, it is not the same as anxiety. Stress is usually caused by something external, such as a deadline, test, or competition. Stress normally passes after the triggering event has ended.  Anxiety is caused by something internal, such as imagining a terrible outcome or worrying that something will go wrong that will devastate you. Anxiety often does not go away even after the triggering event is over, and it can become long-term (chronic) worry. It is important to understand the differences between stress and anxiety and to manage your stress effectively so that it does not lead to an anxious response. Talk with your health care provider or a counselor to learn more about reducing anxiety and stress. He or she may suggest tension reduction techniques, such as:  Music therapy. This can include creating or listening to music that you enjoy and that inspires you.  Mindfulness-based meditation. This involves being aware of your normal breaths while not trying to control your breathing. It can be done while sitting or walking.  Centering prayer. This involves focusing on a word, phrase, or sacred image that means something to you and brings you peace.  Deep breathing. To do this, expand your stomach and inhale slowly through your nose. Hold your breath for 3-5 seconds. Then exhale slowly, letting your stomach muscles  relax.  Self-talk. This involves identifying thought patterns that lead to anxiety reactions and changing those patterns.  Muscle relaxation. This involves tensing muscles and then relaxing them. Choose a tension reduction technique that suits your lifestyle and personality. These techniques take time and practice. Set aside 5-15 minutes a day to do them. Therapists can offer counseling and training in these techniques. The training to help with anxiety may be covered by some insurance plans. Other things you can do to manage stress and anxiety include:  Keeping a stress/anxiety diary. This can help you learn what triggers your reaction and then learn ways to manage your response.  Thinking about how you react to certain situations. You may not be able to control everything, but you can control your response.  Making time for activities that help you relax and not feeling guilty about spending your time in this way.  Visual imagery and yoga can help you stay calm and relax.  Medicines Medicines can help ease symptoms. Medicines for anxiety include:  Anti-anxiety drugs.  Antidepressants. Medicines are often used as a primary treatment for anxiety disorder. Medicines will be prescribed by a health care provider. When used together, medicines, psychotherapy, and tension reduction techniques may be the most effective treatment. Relationships Relationships can play a big part in helping you recover. Try to spend more time connecting with trusted friends and family members. Consider going to couples counseling, taking family education classes, or going to family therapy. Therapy can help you and others better understand your condition. How to recognize changes in your   anxiety Everyone responds differently to treatment for anxiety. Recovery from anxiety happens when symptoms decrease and stop interfering with your daily activities at home or work. This may mean that you will start to:  Have  better concentration and focus. Worry will interfere less in your daily thinking.  Sleep better.  Be less irritable.  Have more energy.  Have improved memory. It is important to recognize when your condition is getting worse. Contact your health care provider if your symptoms interfere with home or work and you feel like your condition is not improving. Follow these instructions at home: Activity  Exercise. Most adults should do the following: ? Exercise for at least 150 minutes each week. The exercise should increase your heart rate and make you sweat (moderate-intensity exercise). ? Strengthening exercises at least twice a week.  Get the right amount and quality of sleep. Most adults need 7-9 hours of sleep each night. Lifestyle   Eat a healthy diet that includes plenty of vegetables, fruits, whole grains, low-fat dairy products, and lean protein. Do not eat a lot of foods that are high in solid fats, added sugars, or salt.  Make choices that simplify your life.  Do not use any products that contain nicotine or tobacco, such as cigarettes, e-cigarettes, and chewing tobacco. If you need help quitting, ask your health care provider.  Avoid caffeine, alcohol, and certain over-the-counter cold medicines. These may make you feel worse. Ask your pharmacist which medicines to avoid. General instructions  Take over-the-counter and prescription medicines only as told by your health care provider.  Keep all follow-up visits as told by your health care provider. This is important. Where to find support You can get help and support from these sources:  Self-help groups.  Online and community organizations.  A trusted spiritual leader.  Couples counseling.  Family education classes.  Family therapy. Where to find more information You may find that joining a support group helps you deal with your anxiety. The following sources can help you locate counselors or support groups near  you:  Mental Health America: www.mentalhealthamerica.net  Anxiety and Depression Association of America (ADAA): www.adaa.org  National Alliance on Mental Illness (NAMI): www.nami.org Contact a health care provider if you:  Have a hard time staying focused or finishing daily tasks.  Spend many hours a day feeling worried about everyday life.  Become exhausted by worry.  Start to have headaches, feel tense, or have nausea.  Urinate more than normal.  Have diarrhea. Get help right away if you have:  A racing heart and shortness of breath.  Thoughts of hurting yourself or others. If you ever feel like you may hurt yourself or others, or have thoughts about taking your own life, get help right away. You can go to your nearest emergency department or call:  Your local emergency services (911 in the [place]  A suicide crisis helpline, such as the National Suicide Prevention Lifeline at 1-800-273-8255. This is open 24 hours a day. Summary  Taking steps to learn and use tension reduction techniques can help calm you and help prevent triggering an anxiety reaction.  When used together, medicines, psychotherapy, and tension reduction techniques may be the most effective treatment.  Family, friends, and partners can play a big part in helping you recover from an anxiety disorder. This information is not intended to replace advice given to you by your health care provider. Make sure you discuss any questions you have with your health care provider. Document Revised:   05/25/2018 Document Reviewed: 05/25/2018 Elsevier Patient Education  [place].   Attention Deficit Hyperactivity Disorder, Adult Attention deficit hyperactivity disorder (ADHD) is a mental health disorder that starts during childhood (neurodevelopmental disorder). For many people with ADHD, the disorder continues into the adult years. Treatment can help you manage your symptoms. What are the causes? The exact  cause of ADHD is not known. Most experts believe genetics and environmental factors contribute to ADHD. What increases the risk? The following factors may make you more likely to develop this condition:  Having a family history of ADHD.  Being male.  Being born to a mother who smoked or drank alcohol during pregnancy.  Being exposed to lead or other toxins in the womb or early in life.  Being born before 55 weeks of pregnancy (prematurely) or at a low birth weight.  Having experienced a brain injury. What are the signs or symptoms? Symptoms of this condition depend on the type of ADHD. The two main types are inattentive and hyperactive-impulsive. Some people may have symptoms of both types. Symptoms of the inattentive type include:  Difficulty paying attention.  Making careless mistakes.  Not following instructions.  Being disorganized.  Avoiding tasks that require time and attention.  Losing and forgetting things.  Being easily distracted. Symptoms of the hyperactive-impulsive type include:  Restlessness.  Talking too much.  Interrupting.  Difficulty with: ? Sitting still. ? Feeling motivated. ? Relaxing. ? Waiting in line or waiting for a turn. In adults, this condition may lead to certain problems, such as:  Keeping jobs.  Performing tasks at work.  Having stable relationships.  Being on time or keeping to a schedule. How is this diagnosed? This condition is diagnosed based on your current symptoms and your history of symptoms. The diagnosis can be made by a health care provider such as a primary care provider or a mental health care specialist. Your health care provider may use a symptom checklist or a behavior rating scale to evaluate your symptoms. He or she may also want to talk with people who have observed your behaviors throughout your life. How is this treated? This condition can be treated with medicines and behavior therapy. Medicines may be the  best option to reduce impulsive behaviors and improve attention. Your health care provider may recommend:  Stimulant medicines. These are the most common medicines used for adult ADHD. They affect certain chemicals in the brain (neurotransmitters) and improve your ability to control your symptoms.  A non-stimulant medicine for adult ADHD (atomoxetine). This medicine increases a neurotransmitter called norepinephrine. It may take weeks to months to see effects from this medicine. Counseling and behavioral management are also important for treating ADHD. Counseling is often used along with medicine. Your health care provider may suggest:  Cognitive behavioral therapy (CBT). This type of therapy teaches you to replace negative thoughts and actions with positive thoughts and actions. When used as part of ADHD treatment, this therapy may also include: ? Coping strategies for organization, time management, impulse control, and stress reduction. ? Mindfulness and meditation training.  Behavioral management. You may work with a Leisure [facility] manager who is specially trained to help people with ADHD manage and organize activities and function more effectively. Follow these instructions at home: Medicines   Take over-the-counter and prescription medicines only as told by your health care provider.  Talk with your health care provider about the possible side effects of your medicines and how to manage them. Lifestyle   Do not use  drugs.  Do not drink alcohol if: ? Your health care provider tells you not to drink. ? You are pregnant, may be pregnant, or are planning to become pregnant.  If you drink alcohol: ? Limit how much you use to:  0-1 drink a day for women.  0-2 drinks a day for men. ? Be aware of how much alcohol is in your drink. In the [place] one drink equals one 12 oz bottle of beer (355 mL), one 5 oz glass of wine (148 mL), or one 1 oz glass of hard liquor (44 mL).  Get enough sleep.  Eat a  healthy diet.  Exercise regularly. Exercise can help to reduce stress and anxiety. General instructions  Learn as much as you can about adult ADHD, and work closely with your health care providers to find the treatments that work best for you.  Follow the same schedule each day.  Use reminder devices like notes, calendars, and phone apps to stay on time and organized.  Keep all follow-up visits as told by your health care provider and therapist. This is important. Where to find more information A health care provider may be able to recommend resources that are available online or over the phone. You could start with:  Attention Deficit Disorder Association (ADDA): PubAddiction.co.nz  National Institute of Mental Health [facility]): https://carter.com/ Contact a health care provider if:  Your symptoms continue to cause problems.  You have side effects from your medicine, such as: ? Repeated muscle twitches, coughing, or speech outbursts. ? Sleep problems. ? Loss of appetite. ? Dizziness. ? Unusually fast heartbeat. ? Stomach pains. ? Headaches.  You are struggling with anxiety, depression, or substance abuse. Get help right away if you:  Have a severe reaction to a medicine. If you ever feel like you may hurt yourself or others, or have thoughts about taking your own life, get help right away. You can go to the nearest emergency department or call:  Your local emergency services (911 in the [place]  A suicide crisis helpline, such as the [place] at 660-364-0393. This is open 24 hours a day. Summary  ADHD is a mental health disorder that starts during childhood (neurodevelopmental disorder) and often continues into the adult years.  The exact cause of ADHD is not known. Most experts believe genetics and environmental factors contribute to ADHD.  There is no cure for ADHD, but treatment with medicine, cognitive behavioral therapy, or behavioral management can  help you manage your condition. This information is not intended to replace advice given to you by your health care provider. Make sure you discuss any questions you have with your health care provider. Document Revised: 05/17/2018 Document Reviewed: 05/17/2018 Elsevier Patient Education  [place].

## 2019-06-28 ENCOUNTER — Other Ambulatory Visit: Payer: Self-pay | Admitting: Family Medicine

## 2019-06-28 DIAGNOSIS — F419 Anxiety disorder, unspecified: Secondary | ICD-10-CM

## 2019-07-01 ENCOUNTER — Encounter: Payer: Medicaid Other | Admitting: Family Medicine

## 2019-07-07 NOTE — Unmapped (Signed)
[facility] Refill Coordination Note    Specialty Medication(s) to be Shipped:   Inflammatory Disorders: Humira    Other medication(s) to be shipped: none     Reginald Conway, DOB: 25-Dec-1985  Phone: [phone] (home)       All above HIPAA information was verified with patient.     Was a Nurse, learning disability used for this call? No    Completed refill call assessment today to schedule patient's medication shipment from the [facility] [phone]).       Specialty medication(s) and dose(s) confirmed: Regimen is correct and unchanged.   Changes to medications: Reginald Conway reports no changes at this time.  Changes to insurance: No  Questions for the pharmacist: No    Confirmed patient received Welcome Packet with first shipment. The patient will receive a drug information handout for each medication shipped and additional FDA Medication Guides as required.       DISEASE/MEDICATION-SPECIFIC INFORMATION        For patients on injectable medications: Patient currently has 2 doses left.  Next injection is scheduled for 07/11/2019.    SPECIALTY MEDICATION ADHERENCE     Medication Adherence    Patient reported X missed doses in the last month: 1  Specialty Medication: Humira CF 40 mg/0.4 ml-patient forgot to take one week  Patient is on additional specialty medications: No  Any gaps in refill history greater than 2 weeks in the last 3 months: no  Demonstrates understanding of importance of adherence: yes  Informant: patient  Reliability of informant: reliable  Confirmed plan for next specialty medication refill: delivery by pharmacy  Refills needed for supportive medications: not needed            Humira CF 40 mg/0.4 ml. 14 days on hand          SHIPPING     Shipping address confirmed in Epic.     Delivery Scheduled: Yes, Expected medication delivery date: 07/15/2019.     Medication will be delivered via Same Day Courier to the prescription address in Epic WAM.    Reginald Conway D Reginald Conway [facility] Specialty Technician

## 2019-07-08 ENCOUNTER — Encounter: Payer: Self-pay | Admitting: Family Medicine

## 2019-07-13 ENCOUNTER — Other Ambulatory Visit: Payer: Self-pay | Admitting: Family Medicine

## 2019-07-13 DIAGNOSIS — F419 Anxiety disorder, unspecified: Secondary | ICD-10-CM

## 2019-07-15 MED FILL — HUMIRA PEN CITRATE FREE 40 MG/0.4 ML: 28 days supply | Qty: 8 | Fill #4 | Status: AC

## 2019-07-15 MED FILL — HUMIRA PEN CITRATE FREE 40 MG/0.4 ML: SUBCUTANEOUS | 28 days supply | Qty: 8 | Fill #4

## 2019-08-03 NOTE — Progress Notes (Signed)
Name: Mario Boyd   MRN: [id number]    DOB: 1985/12/13   Date:08/04/2019       Progress Note  Subjective  Chief Complaint  Chief Complaint  Patient presents with  . Annual Exam    HPI  Patient presents for physical exam and follow-up. Patient is a 34 year old male patient of Delsa Grana Earlier today he is from the [place] area, wearing an equal shirt.  Last seen by Lattie Haw on June 15th, 2021 for anxiety concerns. The Assessment/plan from that visit was as follows:  Start celexa 10 mg daily for 1-2 weeks then increase to 20 mg daily.  Buspar 5-15 mg up to TID prn If you have patient's extreme symptoms he will need a therapist and a psychiatrist to help him with getting appropriate medication management and targeted therapies to work on underlying triggers and past issues.  Patient does not want to put in a referral until his new insurance is active after July 1. I do not feel we should wait for medications until that time so we will start meds closely follow-up.  Patient was encouraged to send a message after July 1 regarding any preferred in network providers that he would like to see For right now we will start treating anxiety since the symptoms are significantly impacting his life.  His ADHD is not currently an issue due to being unemployed, later if he has significant hardships due to untreated ADHD we will restart medicines than when the benefit outweighs the risk  Now on Celexa 20 mg daily and using buspar 2 at night before goes to bed and helps sleep.  When took during the day, did make him drowsy, so stopped taking during the day.  Still struggles with sleeping presently. Has not called insurance yet like rec'ed to assess potential counseling options, and rec'ed again today.  Has not seen counseling. Notes anxiety slightly better with meds, not significantly. Denies depression sx's in association, no sadness, crying spells, and phq9 reviewed.   His prior visit with Lattie Haw was in  May 2021 with the issues addressed as follows: F/up LFTs elevated and worsening over the past 6 months.  They did improve some on check in May.  He denies any alcohol use. Recent Labs[] Expand by Default       Lab Results  Component Value Date   ALT 111 (H) 04/11/2019   AST 63 (H) 04/11/2019   ALKPHOS 77 04/11/2019   BILITOT 0.7 04/11/2019     Lab Results  Component Value Date   ALT 91 (H) 05/24/2019   AST 41 (H) 05/24/2019   ALKPHOS 77 04/11/2019   BILITOT 0.6 05/24/2019    Hyperlipidemia  - Lab Results  Component Value Date   CHOL 282 (H) 05/24/2019   HDL 47 05/24/2019   [place]  05/24/2019     Comment:     . LDL cholesterol not calculated. Triglyceride levels greater than 400 mg/dL invalidate calculated LDL results. . Reference range: <100 . Desirable range <100 mg/dL for primary prevention;   <70 mg/dL for patients with CHD or diabetic patients  with > or = 2 CHD risk factors. Marland Kitchen LDL-C is now calculated using the Martin-Hopkins  calculation, which is a validated novel method providing  better accuracy than the Friedewald equation in the  estimation of LDL-C.  Cresenciano Genre et al. Annamaria Helling. 3818;299(37): 2061-2068  (http://education.[facility].com/faq/FAQ164)    TRIG 563 (H) 05/24/2019   CHOLHDL 6.0 (H) 05/24/2019   cholesterol and triglycerides elevated, likely familial,  improved on 20 mg lipitor, but unfortunately it was causing leg pain/joint pain and weakness, which pt reported after several months.   Stop the medication and checked some basic blood work, CK was normal.  Pts sx improved when he held the statin, he did not restart it.  Medication regimen-then more recently started crestor -5mg  three times a week and tolerating to date.  Also on zetia presently.  Obesity Wt Readings from Last 3 Encounters:  08/04/19 (!) 218 lb 11.2 oz (99.2 kg)  06/21/19 222 lb 1.6 oz (100.7 kg)  05/24/19 222 lb 8 oz (100.9 kg)   Weight has been fairly stable in the  recent past, down a few pounds from a month ago noted. Diet: notes not watching diet much, not adherent to a very strict specific diet, honestly eats a lot of wants want to and only eats once a day Exercise:  notes no regular exercise, occas golf  Pt with chronic sinus issues, he has had  multiple infections since establishing care here, at increased risk after starting humira for HS/dissecting cellulitis.  When treated with antibiotics for 2 to 3 weeks at a time he has had some improvement in symptoms but generally has chronic sinus issues which have recently worsened. ENT referral was placed in Jan 2021, but denied due to insurance card PCP was not updated.  He denies any more recent symptoms of concern and discussed options, and he agrees to monitor presently.  If recurrent symptoms presenting, likely will opt again for ENT referral.  Hidradenitis suppurative- [facility] derm increased dose of humira 80 mg injection weekly- they did initial labs 09/2018, and he has not returned to them for any lab work since (and noted it is recommended to have periodic lab tests on Humira). He does think the medication has been helpful. He has had the Covid vaccine.  Depression: phq 9 is negative for depression Depression screen [facility] 2/9 08/04/2019 06/21/2019 05/24/2019 01/24/2019 12/17/2018  Decreased Interest 0 0 0 0 0  Down, Depressed, Hopeless 0 0 0 0 0  PHQ - 2 Score 0 0 0 0 0  Altered sleeping 3 0 0 0 0  Tired, decreased energy 3 0 0 0 0  Change in appetite 0 0 0 0 0  Feeling bad or failure about yourself  0 0 0 0 0  Trouble concentrating 3 0 0 0 0  Moving slowly or fidgety/restless 0 0 0 0 0  Suicidal thoughts 0 0 0 0 0  PHQ-9 Score 9 0 0 0 0  Difficult doing work/chores Not difficult at all Not difficult at all Not difficult at all Not difficult at all Not difficult at all    Hypertension:  BP Readings from Last 3 Encounters:  08/04/19 104/78  06/21/19 122/78  05/24/19 122/84    Obesity: Wt Readings  from Last 3 Encounters:  08/04/19 (!) 218 lb 11.2 oz (99.2 kg)  06/21/19 222 lb 1.6 oz (100.7 kg)  05/24/19 222 lb 8 oz (100.9 kg)   BMI Readings from Last 3 Encounters:  08/04/19 32.30 kg/m  06/21/19 32.80 kg/m  05/24/19 32.86 kg/m     Lipids:  Lab Results  Component Value Date   CHOL 282 (H) 05/24/2019   CHOL 178 12/17/2018   CHOL 322 (H) 09/17/2018   Lab Results  Component Value Date   HDL 47 05/24/2019   HDL 60 12/17/2018   HDL 41 09/17/2018   Lab Results  Component Value Date   [facility]  05/24/2019  Comment:     . LDL cholesterol not calculated. Triglyceride levels greater than 400 mg/dL invalidate calculated LDL results. . Reference range: <100 . Desirable range <100 mg/dL for primary prevention;   <70 mg/dL for patients with CHD or diabetic patients  with > or = 2 CHD risk factors. Marland Kitchen LDL-C is now calculated using the Martin-Hopkins  calculation, which is a validated novel method providing  better accuracy than the Friedewald equation in the  estimation of LDL-C.  Cresenciano Genre et al. Annamaria Helling. 9678;938(10): 2061-2068  (http://education.[facility].com/faq/FAQ164)    LDLCALC 80 12/17/2018   [facility]  09/17/2018     Comment:     . LDL cholesterol not calculated. Triglyceride levels greater than 400 mg/dL invalidate calculated LDL results. . Reference range: <100 . Desirable range <100 mg/dL for primary prevention;   <70 mg/dL for patients with CHD or diabetic patients  with > or = 2 CHD risk factors. Marland Kitchen LDL-C is now calculated using the Martin-Hopkins  calculation, which is a validated novel method providing  better accuracy than the Friedewald equation in the  estimation of LDL-C.  Cresenciano Genre et al. Annamaria Helling. 1751;025(85): 2061-2068  (http://education.[facility].com/faq/FAQ164)    Lab Results  Component Value Date   TRIG 563 (H) 05/24/2019   TRIG 316 (H) 12/17/2018   TRIG 681 (H) 09/17/2018   Lab Results  Component Value Date   CHOLHDL  6.0 (H) 05/24/2019   CHOLHDL 3.0 12/17/2018   CHOLHDL 7.9 (H) 09/17/2018   No results found for: LDLDIRECT  Glucose:  Glucose, Bld  Date Value Ref Range Status  05/24/2019 77 65 - 99 mg/dL Final    Comment:    .            Fasting reference interval .   04/11/2019 97 70 - 99 mg/dL Final    Comment:    Glucose reference range applies only to samples taken after fasting for at least 8 hours.  01/24/2019 85 65 - 99 mg/dL Final    Comment:    .            Fasting reference interval .       Office Visit from 06/21/2019 in [facility]  AUDIT-C Score 0     No alcohol use Tob - quit last July, 1 PPD prior  He is married, sexually active with only his wife for the past 9 years he does not want to do any STD testing right now  Lab Results  Component Value Date   HEPAIGM NON-REACTIVE 09/17/2018   HEPBIGM NON-REACTIVE 09/17/2018   HEPCAB NON-REACTIVE 09/17/2018    Skin cancer: No lesions of concern Colorectal cancer: Denies family or personal history of colorectal cancer,  no changes in BM's - no blood in stool, dark and tarry stool or constipation, often loose stools, ahd much of life and told IBS, no chronic abd pains Discussed colonoscopy or Cologuard screenings in the future, likely starting at age 15.  Prostate cancer:  GF - prostate CA, dx'ed in 50's No recent frequency, urgency, hesitancy, hematuria Screenings to start potentially including PSAs as part of that when 48.  Lung cancer:   Low Dose CT Chest recommended if Age 33-80 years, 20 pack-year currently smoking OR have quit w/in 15years. Stop screening once a person has not smoked for 15 years or has a health problem that limits life.Patient does not qualify presently.   AAA:   - The USPSTF recommends one-time screening with ultrasonography in men ages 2 to  75 years who have ever smoked ECG:   No concerning sx's, no chest pain, palpitations, shortness of breath, increased lower extremity  swelling  Advanced Care Planning: A voluntary discussion about advance care planning including the explanation and discussion of advance directives.  Discussed health care proxy and Living will, and the patient was able to identify a health care proxy a his wife Alice.  Patient does not have a living will at present time. If patient does have living will, I have requested they bring this to the clinic to be scanned in to their chart.  Patient Active Problem List   Diagnosis Date Noted  . Hyperlipidemia 05/24/2019  . Myalgia due to statin 05/24/2019  . Immunocompromised state due to drug therapy 01/24/2019  . Cervical lymphadenopathy 01/24/2019  . Chronic rhinitis 01/24/2019  . Pansinusitis 01/24/2019  . Class 1 obesity with serious comorbidity and body mass index (BMI) of 31.0 to 31.9 in adult 09/17/2018  . Elevated LFTs 09/17/2018  . Hypertriglyceridemia 09/17/2018  . History of nephrectomy, right 09/17/2018  . Hidradenitis suppurativa 09/17/2018  . Thrombocytosis ([place]) 09/17/2018    Past Surgical History:  Procedure Laterality Date  . HERNIA REPAIR    . KIDNEY SURGERY    . NEPHRECTOMY LIVING DONOR Right 2009  . TONSILLECTOMY    . TYMPANOSTOMY TUBE PLACEMENT      Family History  Problem Relation Age of Onset  . Hyperlipidemia Mother   . Hypertension Mother   . Other Mother        thrombocytosis  . Hyperlipidemia Father   . Hypertension Father   . Epilepsy Sister   . Hypertension Sister   . Epilepsy Son   . Heart failure Maternal Grandmother   . Diabetes Maternal Grandfather   . Heart failure Maternal Grandfather   . Other Maternal Grandfather        thrombocytosis  . Diabetes Paternal Grandmother   . Hypertension Paternal Grandfather   . Prostate cancer Paternal Grandfather     Social History   Socioeconomic History  . Marital status: Married    Spouse name: alice  . Number of children: 2  . Years of education: 44  . Highest education level: Associate degree:  occupational, Hotel manager, or vocational program  Occupational History  . Occupation: unemployed  Tobacco Use  . Smoking status: Former Smoker    Packs/day: 0.50    Years: 15.00    Pack years: 7.50    Types: Cigarettes  . Smokeless tobacco: Never Used  Vaping Use  . Vaping Use: Every day  . Substances: Nicotine, Flavoring  Substance and Sexual Activity  . Alcohol use: Never  . Drug use: Never  . Sexual activity: Yes    Comment: wife on birth control, 9 years  Other Topics Concern  . Not on file  Social History Narrative   Vape; quit smoking; no alcohol; works at [employer]; in [place] 2 children.    Social Determinants of Health   Financial Resource Strain: Low Risk   . Difficulty of Paying Living Expenses: Not hard at all  Food Insecurity: No Food Insecurity  . Worried About Charity fundraiser in the Last Year: Never true  . Ran Out of Food in the Last Year: Never true  Transportation Needs: No Transportation Needs  . Lack of Transportation (Medical): No  . Lack of Transportation (Non-Medical): No  Physical Activity: Inactive  . Days of Exercise per Week: 0 days  . Minutes of Exercise per Session: 0 min  Stress: No Stress Concern Present  . Feeling of Stress : Not at all  Social Connections: Socially Isolated  . Frequency of Communication with Friends and Family: Once a week  . Frequency of Social Gatherings with Friends and Family: Once a week  . Attends Religious Services: Never  . Active Member of Clubs or Organizations: No  . Attends Archivist Meetings: Never  . Marital Status: Married  Human resources officer Violence: Not At Risk  . Fear of Current or Ex-Partner: No  . Emotionally Abused: No  . Physically Abused: No  . Sexually Abused: No     Current Outpatient Medications:  .  Adalimumab (HUMIRA PEN) 40 MG/0.4ML PNKT, Inject 80 mg into the skin daily., Disp: , Rfl:  .  busPIRone (BUSPAR) 5 MG tablet, Take 1-2 tablets (5-10 mg total) by mouth 3  (three) times daily as needed., Disp: 90 tablet, Rfl: 1 .  citalopram (CELEXA) 20 MG tablet, Take 10 mg (half tablet) po daily x 1 weeks then increase 20 mg (1 tablet) po daily, Disp: 60 tablet, Rfl: 1 .  ezetimibe (ZETIA) 10 MG tablet, Take 1 tablet (10 mg total) by mouth daily., Disp: 90 tablet, Rfl: 3 .  fluticasone (FLONASE) 50 MCG/ACT nasal spray, Place 2 sprays into both nostrils daily., Disp: 16 g, Rfl: 11 .  montelukast (SINGULAIR) 10 MG tablet, TAKE 1 TABLET BY MOUTH EVERYDAY AT BEDTIME, Disp: 90 tablet, Rfl: 1 .  rosuvastatin (CRESTOR) 5 MG tablet, Take 5 mg tablet PO at bedtime 3x a week, Disp: 36 tablet, Rfl: 3  No Known Allergies   ROS: As noted above in HPI denies any recent unintentional weight loss,  No recent fevers or other Covid concerning sx's, had the vaccine No increase headaches, vision changes No CP, palpitations,  No increased SOB,   No persistent abdominal pains or change in bowel habits, frequent loose bowel movements for much of his life noted, told had IBS previously No rectal bleeding or dark/black stools,  No flank pains or dysuria,  No frequency or urgency No lower extremity swelling,  No recent increase joint aches or muscle aches,  No numbness, tingling or weakness in the extremities Denies other specific complaints on systems review except as noted in HPI    Objective  Vitals:   08/04/19 1013  BP: 104/78  Pulse: 79  Resp: 16  Temp: 98.2 F (36.8 C)  TempSrc: Temporal  SpO2: 100%  Weight: (!) 218 lb 11.2 oz (99.2 kg)  Height: 5\' 9"  (1.753 m)    Body mass index is 32.3 kg/m.  NAD, masked, pleasant HEENT - sclera anicteric, PERRL, positive glasses, EOMI, conj - non-inj'ed, No sinus tenderness, TM's and canals clear, pharynx clear Neck - supple, no adenopathy, no TM, carotids 2+ and = without bruits bilat Car - RRR without m/g/r Pulm- CTA without wheeze or rales Abd - soft, NT, mildly obese, positive striae, ND, BS+, no obvious HSM, no  masses Back - no CVA tenderness,  Skin- no new lesions of concern on exposed areas, denied otherwise, positive tattoo right upper extremity Ext - no LE edema,  GU -not done as denies any symptoms of concern recent past Rectal: not done, prostate exam deferred (without concerning sx's and after discussion on current recommendations for prostate CA screening including PSA tests) Neuro - affect was not flat, appropriate with conversation  Grossly non-focal with good strength on testing, sensation intact to LT in distal extremities, DTR's 2+ and = patella, Romberg neg, no  pronator drift, good balance on one foot, good finger to nose, good tandem walk, gait normal   Recent Results (from the past 2160 hour(s))  CBC with Differential/Platelet     Status: Abnormal   Collection Time: 05/24/19 11:09 AM  Result Value Ref Range   WBC 13.9 (H) 3.8 - 10.8 Thousand/uL   RBC 5.07 4.20 - 5.80 Million/uL   Hemoglobin 16.2 13.2 - 17.1 g/dL   HCT 46.8 38 - 50 %   MCV 92.3 80.0 - 100.0 fL   MCH 32.0 27.0 - 33.0 pg   MCHC 34.6 32.0 - 36.0 g/dL   RDW 12.7 11.0 - 15.0 %   Platelets 495 (H) 140 - 400 Thousand/uL   MPV 9.5 7.5 - 12.5 fL   Neutro Abs 6,575 1,500 - 7,800 cells/uL   Lymphs Abs 5,796 (H) 850 - 3,900 cells/uL   Absolute Monocytes 1,195 (H) 200 - 950 cells/uL   Eosinophils Absolute 209 15 - 500 cells/uL   Basophils Absolute 125 0 - 200 cells/uL   Neutrophils Relative % 47.3 %   Total Lymphocyte 41.7 %   Monocytes Relative 8.6 %   Eosinophils Relative 1.5 %   Basophils Relative 0.9 %  Lipid panel     Status: Abnormal   Collection Time: 05/24/19 11:09 AM  Result Value Ref Range   Cholesterol 282 (H) <200 mg/dL   HDL 47 > OR = 40 mg/dL   Triglycerides 563 (H) <150 mg/dL    Comment: . If a non-fasting specimen was collected, consider repeat triglyceride testing on a fasting specimen if clinically indicated.  Yates Decamp et al. J. of Clin. Lipidol. 0174;9:449-675. . . There is increased risk  of pancreatitis when the  triglyceride concentration is very high  (> or = 500 mg/dL, especially if > or = 1000 mg/dL).  Yates Decamp et al. J. of Clin. Lipidol. 9163;8:466-599. Marland Kitchen    LDL Cholesterol (Calc)  mg/dL (calc)    Comment: . LDL cholesterol not calculated. Triglyceride levels greater than 400 mg/dL invalidate calculated LDL results. . Reference range: <100 . Desirable range <100 mg/dL for primary prevention;   <70 mg/dL for patients with CHD or diabetic patients  with > or = 2 CHD risk factors. Marland Kitchen LDL-C is now calculated using the Martin-Hopkins  calculation, which is a validated novel method providing  better accuracy than the Friedewald equation in the  estimation of LDL-C.  Cresenciano Genre et al. Annamaria Helling. 3570;177(93): 2061-2068  (http://education.[facility].com/faq/FAQ164)    Total CHOL/HDL Ratio 6.0 (H) <5.0 (calc)   Non-HDL Cholesterol (Calc) 235 (H) <130 mg/dL (calc)    Comment: Non-HDL level > or = 220 is very high and may indicate  genetic familial hypercholesterolemia (FH). Clinical  assessment and measurement of blood lipid levels  should be considered for all first-degree relatives  of patients with an FH diagnosis. . For patients with diabetes plus 1 major ASCVD risk  factor, treating to a non-HDL-C goal of <100 mg/dL  (LDL-C of <70 mg/dL) is considered a therapeutic  option.   COMPLETE METABOLIC PANEL WITH GFR     Status: Abnormal   Collection Time: 05/24/19 11:09 AM  Result Value Ref Range   Glucose, Bld 77 65 - 99 mg/dL    Comment: .            Fasting reference interval .    BUN 13 7 - 25 mg/dL   Creat 1.08 0.60 - 1.35 mg/dL   GFR, Est Non African American 88 > OR =  60 mL/min/1.54m2   GFR, Est African American 103 > OR = 60 mL/min/1.84m2   BUN/Creatinine Ratio NOT APPLICABLE 6 - 22 (calc)   Sodium 139 135 - 146 mmol/L   Potassium 4.8 3.5 - 5.3 mmol/L   Chloride 101 98 - 110 mmol/L   CO2 28 20 - 32 mmol/L   Calcium 10.5 (H) 8.6 - 10.3 mg/dL    Total Protein 7.5 6.1 - 8.1 g/dL   Albumin 4.6 3.6 - 5.1 g/dL   Globulin 2.9 1.9 - 3.7 g/dL (calc)   AG Ratio 1.6 1.0 - 2.5 (calc)   Total Bilirubin 0.6 0.2 - 1.2 mg/dL   Alkaline phosphatase (APISO) 89 36 - 130 U/L   AST 41 (H) 10 - 40 U/L   ALT 91 (H) 9 - 46 U/L     PHQ2/9: Depression screen [facility] 2/9 08/04/2019 06/21/2019 05/24/2019 01/24/2019 12/17/2018  Decreased Interest 0 0 0 0 0  Down, Depressed, Hopeless 0 0 0 0 0  PHQ - 2 Score 0 0 0 0 0  Altered sleeping 3 0 0 0 0  Tired, decreased energy 3 0 0 0 0  Change in appetite 0 0 0 0 0  Feeling bad or failure about yourself  0 0 0 0 0  Trouble concentrating 3 0 0 0 0  Moving slowly or fidgety/restless 0 0 0 0 0  Suicidal thoughts 0 0 0 0 0  PHQ-9 Score 9 0 0 0 0  Difficult doing work/chores Not difficult at all Not difficult at all Not difficult at all Not difficult at all Not difficult at all    Fall Risk: Fall Risk  08/04/2019 06/21/2019 05/24/2019 01/24/2019 12/17/2018  Falls in the past year? 0 0 0 0 0  Number falls in past yr: 0 0 0 0 0  Injury with Fall? 0 0 0 0 0  Follow up - Falls evaluation completed - - -     Functional Status Survey: Is the patient deaf or have difficulty hearing?: No Does the patient have difficulty seeing, even when wearing glasses/contacts?: No Does the patient have difficulty concentrating, remembering, or making decisions?: Yes Does the patient have difficulty walking or climbing stairs?: No Does the patient have difficulty dressing or bathing?: No Does the patient have difficulty doing errands alone such as visiting a doctor's office or shopping?: No  Last labs reviewed. Increased WBC and platelet count noted on CBC, abnormal lipid panel, slight elevation in transaminases and calcium noted on comp panel.   Assessment & Plan  General Health/Health Maintenance:   Prostate cancer screening and PSA options (with potential risks and benefits of testing vs not testing) were discussed along with  recent recs/guidelines and more discussion planned in the future.  -USPSTF grade A and B recommendations reviewed with patient; age-appropriate recommendations, preventive care, screening tests, etc discussed and encouraged; healthy living encouraged; see AVS for any further patient education given to patient  -Discussed importance of regular physical activity weekly, and encouraged.  Noted golf with a glf cart not really count is a good aerobic exercise  -Discussed importance of eating healthier diet  -Reviewed Health Maintenance:  Adult vaccines due  Topic Date Due  . TETANUS/TDAP  12/16/2028   1. Hypertriglyceridemia 2. Myalgia due to atorvastatin, tolerating low-dose Crestor to date 3. Hyperlipidemia, unspecified hyperlipidemia type Reviewed the lipid panel, and do feel medications are indicated and will be helpful in management. Refill the Zetia product today to continue. Emphasized dietary modifications to help with  information provided in the AVS as well. He has tolerated the low-dose statin to date, 5 mg of Crestor taking 3 times a week. Recommended a trial of daily 5 mg of Crestor presently and assess response. Last transaminases were down from previous, and doubt the elevation was secondary to a statin.  We will continue to monitor with the increase Crestor dose, with plans to check again on follow-up visit. - ezetimibe (ZETIA) 10 MG tablet; Take 1 tablet (10 mg total) by mouth daily.  Dispense: 90 tablet; Refill: 3 - rosuvastatin (CRESTOR) 5 MG tablet; Take 1 tablet (5 mg total) by mouth daily.  Dispense: 90 tablet; Refill: 3  4. Anxiety disorder, unspecified type 5. Psychophysiological insomnia As tolerated the Celexa at 20 mg daily without concerning side effects.  He is not convinced it is made a huge difference presently.  Still struggling with sleep, and likely contributing to his symptoms continuing.  He noted that BuSpar tended to make him drowsy, so he stopped taking  that during the day, and use that mostly at nighttime.  Felt best to change to a trazodone product to use at bedtime, trying half of a tablet initially, and if not helping much going to a full tablet nightly and assess his response.  Should not take the BuSpar in combination He has had no depression concerns in the recent past in association and continue to monitor.  - traZODone (DESYREL) 50 MG tablet; Take 0.5-1 tablets (25-50 mg total) by mouth at bedtime as needed for sleep.  Dispense: 30 tablet; Refill: 2  6. Abnormal transaminases Last lab check noted improvement, although still elevated. Other liver tests obtained were normal.  His clotting factors were normal. Question if secondary to the Humira, possibly from inflammation related to his hidradenitis, doubt due to a statin. Felt reasonable to try to increase the statin to once daily and continue to monitor. He does not drink alcohol presently.  Also should minimize use of acetaminophen or Tylenol type products.  7. Class 1 obesity due to excess calories with serious comorbidity and body mass index (BMI) of 32.0 to 32.9 in adult Discussed the importance of healthy weight maintenance, with the importance of dietary modifications in addition to exercise and helping with health maintenance. Continue to monitor.  8. Hidradenitis suppurativa Noted some concern that Humira was started by [facility] dermatology, without any recommendations for periodic lab checks.  He notes that they continue to renew his medication. Unclear if the elevated transaminases could be secondary to this, although not markedly elevated, and all other liver function tests are normal. Felt best to recheck the lipid panel again after he has been on the Crestor daily for a few weeks, and okay to hold off on checking the other labs at that time as well.  9. 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  10. Leukocytosis, unspecified  type 11. Thrombocytosis ([place]) Exact cause of this is unclear, I do feel the next best step for repeating these labs.  He has not had regular follow-ups after starting the Humira blood counts, although often concerns with lower platelets and lower white blood count on this medicine. It was noted previously that the elevated platelets were likely due to increased inflammation likely from the hidradenitis with a C-reactive protein slightly elevated in April.  They had improved some on the most recent check noted. Will get labs again in about 4 weeks time for his lipids, and will  recheck the counts at that time as well.   12. Pansinusitis, unspecified chronicity 13. Chronic rhinitis Has a history of frequent sinus infections and concerning sinusitis, and a previous referral for ENT was denied.  He notes he has been doing fairly well in the recent past with no recent infectious concerns or sinus symptoms.  Has some mild chronic rhinitis issues. Agreed to hold off on a referral today for ENT again, although if he has recurrence of symptoms as we discussed today, we will have a low yield to refer to ENT at that time.  14. Hypercalcemia Noted on the last lab check, and will recheck again here with labs in about 4 weeks time as planned.  15.  Annual physical exam -discussed health maintenance issues during this visit including cancer screenings, STD screening, and other elements included an annual physical assessments.  We will schedule follow-up in approximately 4 weeks time, sooner as needed with labs to be checked at that visit including a CBC, comprehensive panel which includes a calcium level and liver tests, a C-reactive protein, and a lipid panel.

## 2019-08-04 ENCOUNTER — Ambulatory Visit: Payer: Medicaid Other | Admitting: Internal Medicine

## 2019-08-04 ENCOUNTER — Other Ambulatory Visit: Payer: Self-pay

## 2019-08-04 ENCOUNTER — Encounter: Payer: Self-pay | Admitting: Internal Medicine

## 2019-08-04 VITALS — BP 104/78 | HR 79 | Temp 98.2°F | Resp 16 | Ht 69.0 in | Wt 218.7 lb

## 2019-08-04 DIAGNOSIS — D75839 Thrombocytosis, unspecified: Secondary | ICD-10-CM

## 2019-08-04 DIAGNOSIS — E785 Hyperlipidemia, unspecified: Secondary | ICD-10-CM

## 2019-08-04 DIAGNOSIS — F5104 Psychophysiologic insomnia: Secondary | ICD-10-CM

## 2019-08-04 DIAGNOSIS — E781 Pure hyperglyceridemia: Secondary | ICD-10-CM

## 2019-08-04 DIAGNOSIS — D473 Essential (hemorrhagic) thrombocythemia: Secondary | ICD-10-CM | POA: Diagnosis not present

## 2019-08-04 DIAGNOSIS — T466X5A Adverse effect of antihyperlipidemic and antiarteriosclerotic drugs, initial encounter: Secondary | ICD-10-CM

## 2019-08-04 DIAGNOSIS — F419 Anxiety disorder, unspecified: Secondary | ICD-10-CM | POA: Diagnosis not present

## 2019-08-04 DIAGNOSIS — E6609 Other obesity due to excess calories: Secondary | ICD-10-CM

## 2019-08-04 DIAGNOSIS — J324 Chronic pansinusitis: Secondary | ICD-10-CM

## 2019-08-04 DIAGNOSIS — M791 Myalgia, unspecified site: Secondary | ICD-10-CM | POA: Diagnosis not present

## 2019-08-04 DIAGNOSIS — R748 Abnormal levels of other serum enzymes: Secondary | ICD-10-CM

## 2019-08-04 DIAGNOSIS — E66811 Obesity, class 1: Secondary | ICD-10-CM

## 2019-08-04 DIAGNOSIS — L732 Hidradenitis suppurativa: Secondary | ICD-10-CM | POA: Diagnosis not present

## 2019-08-04 DIAGNOSIS — D72829 Elevated white blood cell count, unspecified: Secondary | ICD-10-CM

## 2019-08-04 DIAGNOSIS — J310 Chronic rhinitis: Secondary | ICD-10-CM

## 2019-08-04 DIAGNOSIS — Z905 Acquired absence of kidney: Secondary | ICD-10-CM | POA: Diagnosis not present

## 2019-08-04 DIAGNOSIS — Z6832 Body mass index (BMI) 32.0-32.9, adult: Secondary | ICD-10-CM

## 2019-08-04 DIAGNOSIS — Z0000 Encounter for general adult medical examination without abnormal findings: Secondary | ICD-10-CM

## 2019-08-04 MED ORDER — ROSUVASTATIN CALCIUM 5 MG PO TABS: ORAL_TABLET | ORAL | 3 refills | Status: DC

## 2019-08-04 MED ORDER — TRAZODONE HCL 50 MG PO TABS: 25.0000 mg | ORAL_TABLET | Freq: Every evening | ORAL | 2 refills | Status: DC | PRN

## 2019-08-04 MED ORDER — EZETIMIBE 10 MG PO TABS: 10.0000 mg | ORAL_TABLET | Freq: Every day | ORAL | 3 refills | Status: DC

## 2019-08-04 MED ORDER — ROSUVASTATIN CALCIUM 5 MG PO TABS: 5.0000 mg | ORAL_TABLET | Freq: Every day | ORAL | 3 refills | Status: DC

## 2019-08-04 NOTE — Patient Instructions (Signed)

## 2019-08-24 ENCOUNTER — Other Ambulatory Visit: Payer: Self-pay | Admitting: Family Medicine

## 2019-08-24 DIAGNOSIS — F419 Anxiety disorder, unspecified: Secondary | ICD-10-CM

## 2019-08-31 ENCOUNTER — Encounter: Payer: Self-pay | Admitting: Family Medicine

## 2019-08-31 ENCOUNTER — Other Ambulatory Visit: Payer: Self-pay

## 2019-08-31 DIAGNOSIS — F5104 Psychophysiologic insomnia: Secondary | ICD-10-CM

## 2019-08-31 DIAGNOSIS — F419 Anxiety disorder, unspecified: Secondary | ICD-10-CM

## 2019-09-06 NOTE — Unmapped (Signed)
[facility] Refill Coordination Note    Specialty Medication(s) to be Shipped:   Inflammatory Disorders: Humira    Other medication(s) to be shipped: No additional medications requested for fill at this time     Reginald Conway, DOB: 1985-12-17  Phone: [phone] (home)       All above HIPAA information was verified with patient.     Was a Nurse, learning disability used for this call? No    Completed refill call assessment today to schedule patient's medication shipment from the [facility] [phone]).       Specialty medication(s) and dose(s) confirmed: Regimen is correct and unchanged.   Changes to medications: Reginald Conway reports no changes at this time.  Changes to insurance: No  Questions for the pharmacist: No    Confirmed patient received Welcome Packet with first shipment. The patient will receive a drug information handout for each medication shipped and additional FDA Medication Guides as required.       DISEASE/MEDICATION-SPECIFIC INFORMATION        For patients on injectable medications: Patient currently has 0 doses left.  Next injection is scheduled for 9/7.    SPECIALTY MEDICATION ADHERENCE     Medication Adherence    Patient reported X missed doses in the last month: 0  Specialty Medication: Humira CF 40 mg/0.4 ml  Patient is on additional specialty medications: No  Patient is on more than two specialty medications: No  Any gaps in refill history greater than 2 weeks in the last 3 months: no  Demonstrates understanding of importance of adherence: yes  Informant: patient                Humira 40mg /0.56ml: Patient has 0 days of medication on hand      SHIPPING     Shipping address confirmed in Epic.     Delivery Scheduled: Yes, Expected medication delivery date: 9/3.     Medication will be delivered via Same Day Courier to the prescription address in Epic WAM.    Reginald Conway   [facility] Specialty Technician

## 2019-09-09 MED FILL — HUMIRA PEN CITRATE FREE 40 MG/0.4 ML: SUBCUTANEOUS | 28 days supply | Qty: 8 | Fill #5

## 2019-09-09 MED FILL — HUMIRA PEN CITRATE FREE 40 MG/0.4 ML: 28 days supply | Qty: 8 | Fill #5 | Status: AC

## 2019-09-18 ENCOUNTER — Other Ambulatory Visit: Payer: Self-pay | Admitting: Family Medicine

## 2019-09-18 DIAGNOSIS — F419 Anxiety disorder, unspecified: Secondary | ICD-10-CM

## 2019-10-04 NOTE — Progress Notes (Signed)
Name: Mario Boyd   MRN: [id number]    DOB: Mar 05, 1985   Date:10/05/2019       Progress Note  Chief Complaint  Patient presents with  . Depression  . Hyperlipidemia     Subjective:   Mario Boyd is a 34 y.o. male, presents to clinic for routine f/up  Hyperlipidemia: Currently treated with Crestor 5mg  qd Past med intolerance Labs unfortunately were unable to calculate LDL previously Last Lipids: Lab Results  Component Value Date   CHOL 282 (H) 05/24/2019   HDL 47 05/24/2019   [place]  05/24/2019     Comment:     . LDL cholesterol not calculated. Triglyceride levels greater than 400 mg/dL invalidate calculated LDL results. . Reference range: <100 . Desirable range <100 mg/dL for primary prevention;   <70 mg/dL for patients with CHD or diabetic patients  with > or = 2 CHD risk factors. Marland Kitchen LDL-C is now calculated using the Martin-Hopkins  calculation, which is a validated novel method providing  better accuracy than the Friedewald equation in the  estimation of LDL-C.  Cresenciano Genre et al. Annamaria Helling. 8469;629(52): 2061-2068  (http://education.[employer].com/faq/FAQ164)    TRIG 563 (H) 05/24/2019   CHOLHDL 6.0 (H) 05/24/2019   - Denies: Chest pain, shortness of breath, myalgias, claudication  Anxiety and Depression: Recently started citalopram and buspar Dr. Lemmie Evens saw the pt for a routine f/up (?) and changed buspar to trazadone, but pt did not tolerate it - it was too sedating  GAD 7 : Generalized Anxiety Score 10/05/2019 06/21/2019  Nervous, Anxious, on Edge 2 3  Control/stop worrying 1 2  Worry too much - different things 2 2  Trouble relaxing 2 3  Restless 0 3  Easily annoyed or irritable 0 2  Afraid - awful might happen 0 3  Total GAD 7 Score 7 18  Anxiety Difficulty - Extremely difficult   Depression screen [facility] 2/9 10/05/2019 08/04/2019 06/21/2019  Decreased Interest 0 0 0  Down, Depressed, Hopeless 0 0 0  PHQ - 2 Score 0 0 0  Altered sleeping 3 3 0    Tired, decreased energy 2 3 0  Change in appetite 2 0 0  Feeling bad or failure about yourself  0 0 0  Trouble concentrating 2 3 0  Moving slowly or fidgety/restless 0 0 0  Suicidal thoughts 0 0 0  PHQ-9 Score 9 9 0  Difficult doing work/chores Somewhat difficult Not difficult at all Not difficult at all    Patient also has some redness in one of his eyes:Left eye something in the corner of his eye sore red -he felt like he had something in his eye and it felt like a foreign body sort of grainy and irritating he rubbed it and it was more red he has had no purulent discharge or crusting he has no blurry or decreased vision he has no pain with refocusing his vision on different objects at different distances from himself he denies photophobia    Current Outpatient Medications:  .  busPIRone (BUSPAR) 5 MG tablet, Take 1-2 tablets (5-10 mg total) by mouth 3 (three) times daily as needed., Disp: 90 tablet, Rfl: 1 .  citalopram (CELEXA) 20 MG tablet, TAKE 1 TABLET BY MOUTH DAILY FOR 1 WEEK THEN INCREASE TO 2 TABLETS BY MOUTH DAILY, Disp: 30 tablet, Rfl: 0 .  ezetimibe (ZETIA) 10 MG tablet, Take 1 tablet (10 mg total) by mouth daily., Disp: 90 tablet, Rfl: 3 .  fluticasone (FLONASE) 50 MCG/ACT  nasal spray, Place 2 sprays into both nostrils daily., Disp: 16 g, Rfl: 11 .  montelukast (SINGULAIR) 10 MG tablet, TAKE 1 TABLET BY MOUTH EVERYDAY AT BEDTIME, Disp: 90 tablet, Rfl: 1 .  rosuvastatin (CRESTOR) 5 MG tablet, Take 1 tablet (5 mg total) by mouth daily., Disp: 90 tablet, Rfl: 3 .  Adalimumab (HUMIRA PEN) 40 MG/0.4ML PNKT, Inject 80 mg into the skin daily., Disp: , Rfl:   Patient Active Problem List   Diagnosis Date Noted  . Abnormal transaminases 08/04/2019  . Psychophysiological insomnia 08/04/2019  . Anxiety disorder 08/04/2019  . Hyperlipidemia 05/24/2019  . Myalgia due to statin 05/24/2019  . Immunocompromised state due to drug therapy 01/24/2019  . Cervical lymphadenopathy 01/24/2019   . Chronic rhinitis 01/24/2019  . Pansinusitis 01/24/2019  . Class 1 obesity due to excess calories with serious comorbidity and body mass index (BMI) of 32.0 to 32.9 in adult 09/17/2018  . Elevated LFTs 09/17/2018  . Hypertriglyceridemia 09/17/2018  . History of nephrectomy, right 09/17/2018  . Hidradenitis suppurativa 09/17/2018  . Thrombocytosis ([place]) 09/17/2018    Past Surgical History:  Procedure Laterality Date  . HERNIA REPAIR    . KIDNEY SURGERY    . NEPHRECTOMY LIVING DONOR Right 2009  . TONSILLECTOMY    . TYMPANOSTOMY TUBE PLACEMENT      Family History  Problem Relation Age of Onset  . Hyperlipidemia Mother   . Hypertension Mother   . Other Mother        thrombocytosis  . Hyperlipidemia Father   . Hypertension Father   . Epilepsy Sister   . Hypertension Sister   . Epilepsy Son   . Heart failure Maternal Grandmother   . Diabetes Maternal Grandfather   . Heart failure Maternal Grandfather   . Other Maternal Grandfather        thrombocytosis  . Diabetes Paternal Grandmother   . Hypertension Paternal Grandfather   . Prostate cancer Paternal Grandfather     Social History   Tobacco Use  . Smoking status: Former Smoker    Packs/day: 0.50    Years: 15.00    Pack years: 7.50    Types: Cigarettes  . Smokeless tobacco: Never Used  Vaping Use  . Vaping Use: Every day  . Substances: Nicotine, Flavoring  Substance Use Topics  . Alcohol use: Never  . Drug use: Never     No Known Allergies  Health Maintenance  Topic Date Due  . INFLUENZA VACCINE  08/07/2019  . HIV Screening  05/23/2020 (Originally 08/04/1998)  . TETANUS/TDAP  12/16/2028  . COVID-19 Vaccine  Completed  . Hepatitis C Screening  Completed    Chart Review Today: I personally reviewed active problem list, medication list, allergies, family history, social history, health maintenance, notes from last encounter, lab results, imaging with the patient/caregiver today.   Review of Systems   Constitutional: Negative.   HENT: Negative.   Eyes: Negative.   Respiratory: Negative.   Cardiovascular: Negative.   Gastrointestinal: Negative.   Endocrine: Negative.   Genitourinary: Negative.   Musculoskeletal: Negative.   Skin: Negative.   Allergic/Immunologic: Negative.   Neurological: Negative.   Hematological: Negative.   Psychiatric/Behavioral: Negative.   All other systems reviewed and are negative.    Objective:   Vitals:   10/05/19 0937  BP: 118/80  Pulse: 98  Resp: 18  Temp: 98.5 F (36.9 C)  TempSrc: Oral  SpO2: 99%  Weight: 222 lb 4.8 oz (100.8 kg)  Height: 5\' 9"  (1.753 m)  Body mass index is 32.83 kg/m.  Physical Exam Vitals and nursing note reviewed.  Constitutional:      General: He is not in acute distress.    Appearance: Normal appearance. He is well-developed. He is obese. He is not ill-appearing, toxic-appearing or diaphoretic.     Interventions: Face mask in place.  HENT:     Head: Normocephalic and atraumatic.     Jaw: No trismus.     Right Ear: External ear normal.     Left Ear: External ear normal.  Eyes:     General: Lids are normal. No scleral icterus.       Right eye: No discharge.        Left eye: No discharge.     Pupils: Pupils are equal, round, and reactive to light.     Comments: Left conjunctiva diffusely injected No purulent discharge, no lid swelling, no evidence of foreign body  Neck:     Trachea: Trachea and phonation normal. No tracheal deviation.  Cardiovascular:     Rate and Rhythm: Normal rate and regular rhythm.     Pulses: Normal pulses.          Radial pulses are 2+ on the right side and 2+ on the left side.       Posterior tibial pulses are 2+ on the right side and 2+ on the left side.     Heart sounds: Normal heart sounds. No murmur heard.  No friction rub. No gallop.   Pulmonary:     Effort: Pulmonary effort is normal. No respiratory distress.     Breath sounds: Normal breath sounds. No stridor. No  wheezing, rhonchi or rales.  Abdominal:     General: Bowel sounds are normal. There is no distension.     Palpations: Abdomen is soft.  Musculoskeletal:     Right lower leg: No edema.     Left lower leg: No edema.  Skin:    General: Skin is warm and dry.     Coloration: Skin is not jaundiced.     Findings: No rash.     Nails: There is no clubbing.  Neurological:     Mental Status: He is alert. Mental status is at baseline.     Cranial Nerves: No dysarthria or facial asymmetry.     Motor: No tremor or abnormal muscle tone.     Gait: Gait normal.  Psychiatric:        Mood and Affect: Mood normal.        Speech: Speech normal.        Behavior: Behavior normal. Behavior is cooperative.         Assessment & Plan:   1. Hypertriglyceridemia Recheck with fasting labs, he is on statin, continue working on low glycemic index diet and avoiding transfat and saturated fat - COMPLETE METABOLIC PANEL WITH GFR - Lipid panel  2. Hyperlipidemia, unspecified hyperlipidemia type Same as above, compliant with statin, with lower dosing and spaced out dosing less myalgias and less fatigue - COMPLETE METABOLIC PANEL WITH GFR - Lipid panel  3. Anxiety disorder, unspecified type Patient felt a significant improvement in his anxiety symptoms and BuSpar did help him get to sleep a lot better at night and then with trazodone He has not yet established with a psychiatrist Continue citalopram 40 mg daily and BuSpar 5 to 10 mg 3 times daily as needed - busPIRone (BUSPAR) 5 MG tablet; Take 1-2 tablets (5-10 mg total) by mouth 3 (three) times daily  as needed.  Dispense: 90 tablet; Refill: 1  4. Psychophysiological insomnia Encouraged him to discuss this with psychiatry or with a therapist Restart BuSpar since it was helping with anxiety symptoms which prevent sleep Work on starting to exercise and other good sleep hygiene  5. Anxiety disorder, unspecified type Improving anxiety symptoms continue  citalopram 40 mg and resume BuSpar as needed - busPIRone (BUSPAR) 5 MG tablet; Take 1-2 tablets (5-10 mg total) by mouth 3 (three) times daily as needed.  Dispense: 90 tablet; Refill: 1  6. Encounter for medication monitoring - CBC with Differential/Platelet - COMPLETE METABOLIC PANEL WITH GFR - Lipid panel  7. Elevated LFTs Monitoring, has gradually improved may be secondary to medications or triglycerides and elevated cholesterol? - COMPLETE METABOLIC PANEL WITH GFR - Lipid panel  8. Need for influenza vaccination - Flu Vaccine QUAD 6+ mos PF IM (Fluarix Quad PF)   Return in about 3 months (around 01/04/2020) for virtual f/up for anxiety/med check (in person if abnormal labs need f/up).   Delsa Grana, PA-C 10/05/19 9:43 AM

## 2019-10-05 ENCOUNTER — Ambulatory Visit: Payer: Medicaid Other | Admitting: Family Medicine

## 2019-10-05 ENCOUNTER — Other Ambulatory Visit: Payer: Self-pay

## 2019-10-05 ENCOUNTER — Encounter: Payer: Self-pay | Admitting: Family Medicine

## 2019-10-05 VITALS — BP 118/80 | HR 98 | Temp 98.5°F | Resp 18 | Ht 69.0 in | Wt 222.3 lb

## 2019-10-05 DIAGNOSIS — F5104 Psychophysiologic insomnia: Secondary | ICD-10-CM

## 2019-10-05 DIAGNOSIS — Z23 Encounter for immunization: Secondary | ICD-10-CM | POA: Diagnosis not present

## 2019-10-05 DIAGNOSIS — R7989 Other specified abnormal findings of blood chemistry: Secondary | ICD-10-CM

## 2019-10-05 DIAGNOSIS — E785 Hyperlipidemia, unspecified: Secondary | ICD-10-CM | POA: Diagnosis not present

## 2019-10-05 DIAGNOSIS — F419 Anxiety disorder, unspecified: Secondary | ICD-10-CM | POA: Diagnosis not present

## 2019-10-05 DIAGNOSIS — E781 Pure hyperglyceridemia: Secondary | ICD-10-CM

## 2019-10-05 DIAGNOSIS — Z5181 Encounter for therapeutic drug level monitoring: Secondary | ICD-10-CM

## 2019-10-05 MED ORDER — CROMOLYN SODIUM 4 % OP SOLN: 1.0000 [drp] | Freq: Four times a day (QID) | OPHTHALMIC | 0 refills | Status: DC

## 2019-10-05 MED ORDER — BUSPIRONE HCL 5 MG PO TABS: 5.0000 mg | ORAL_TABLET | Freq: Three times a day (TID) | ORAL | 1 refills | Status: DC | PRN

## 2019-10-05 NOTE — Patient Instructions (Addendum)
Lumify eye drops try     Allergic Conjunctivitis A clear membrane (conjunctiva) covers the white part of your eye and the inner surface of your eyelid. Allergic conjunctivitis happens when this membrane has inflammation. This is caused by allergies. Common causes of allergic reactions (allergens) include:  Outdoor allergens, such as: ? Pollen. ? Grass and weeds. ? Mold spores.  Indoor allergens, such as: ? Dust. ? Smoke. ? Mold. ? Pet dander. ? Animal hair. This condition can make your eye red or pink. It can also make your eye feel itchy. This condition cannot be spread from one person to another person (is not contagious). Follow these instructions at home:  Try not to be around things that you are allergic to.  Take or apply over-the-counter and prescription medicines only as told by your doctor. These include any eye drops.  Place a cool, clean washcloth on your eye for 10-20 minutes. Do this 3-4 times a day.  Do not touch or rub your eyes.  Do not wear contact lenses until the inflammation is gone. Wear glasses instead.  Do not wear eye makeup until the inflammation is gone.  Keep all follow-up visits as told by your doctor. This is important. Contact a doctor if:  Your symptoms get worse.  Your symptoms do not get better with treatment.  You have mild eye pain.  You are sensitive to light,  You have spots or blisters on your eyes.  You have pus coming from your eye.  You have a fever. Get help right away if:  You have redness, swelling, or other symptoms in only one eye.  Your vision is blurry.  You have vision changes.  You have very bad eye pain. Summary  Allergic conjunctivitis is caused by allergies. It can make your eye red or pink, and it can make your eye feel itchy.  This condition cannot be spread from one person to another person (is not contagious).  Try not to be around things that you are allergic to.  Take or apply over-the-counter  and prescription medicines only as told by your doctor. These include any eye drops.  Contact your doctor if your symptoms get worse or they do not get better with treatment. This information is not intended to replace advice given to you by your health care provider. Make sure you discuss any questions you have with your health care provider. Document Revised: 04/13/2018 Document Reviewed: 08/17/2015 Elsevier Patient Education  [place].

## 2019-10-10 DIAGNOSIS — D75839 Thrombocythemia: Principal | ICD-10-CM

## 2019-10-10 DIAGNOSIS — R7989 Other specified abnormal findings of blood chemistry: Secondary | ICD-10-CM | POA: Diagnosis not present

## 2019-10-10 DIAGNOSIS — E781 Pure hyperglyceridemia: Secondary | ICD-10-CM | POA: Diagnosis not present

## 2019-10-10 DIAGNOSIS — Z5181 Encounter for therapeutic drug level monitoring: Secondary | ICD-10-CM | POA: Diagnosis not present

## 2019-10-10 DIAGNOSIS — E785 Hyperlipidemia, unspecified: Secondary | ICD-10-CM | POA: Diagnosis not present

## 2019-10-10 LAB — CBC WITH DIFFERENTIAL/PLATELET
Absolute Monocytes: 796 cells/uL (ref 200–950)
Basophils Absolute: 109 cells/uL (ref 0–200)
Basophils Relative: 1 %
Eosinophils Absolute: 174 cells/uL (ref 15–500)
Eosinophils Relative: 1.6 %
HCT: 48.6 % (ref 38.5–50.0)
Hemoglobin: 16.9 g/dL (ref 13.2–17.1)
Lymphs Abs: 5069 cells/uL — ABNORMAL HIGH (ref 850–3900)
MCH: 32 pg (ref 27.0–33.0)
MCHC: 34.8 g/dL (ref 32.0–36.0)
MCV: 92 fL (ref 80.0–100.0)
MPV: 9.5 fL (ref 7.5–12.5)
Monocytes Relative: 7.3 %
Neutro Abs: 4752 cells/uL (ref 1500–7800)
Neutrophils Relative %: 43.6 %
Platelets: 481 10*3/uL — ABNORMAL HIGH (ref 140–400)
RBC: 5.28 10*6/uL (ref 4.20–5.80)
RDW: 12.5 % (ref 11.0–15.0)
Total Lymphocyte: 46.5 %
WBC: 10.9 10*3/uL — ABNORMAL HIGH (ref 3.8–10.8)

## 2019-10-10 LAB — COMPLETE METABOLIC PANEL WITH GFR
AG Ratio: 1.6 (calc) (ref 1.0–2.5)
ALT: 88 U/L — ABNORMAL HIGH (ref 9–46)
AST: 41 U/L — ABNORMAL HIGH (ref 10–40)
Albumin: 4.7 g/dL (ref 3.6–5.1)
Alkaline phosphatase (APISO): 88 U/L (ref 36–130)
BUN: 14 mg/dL (ref 7–25)
CO2: 24 mmol/L (ref 20–32)
Calcium: 10.4 mg/dL — ABNORMAL HIGH (ref 8.6–10.3)
Chloride: 103 mmol/L (ref 98–110)
Creat: 1.01 mg/dL (ref 0.60–1.35)
GFR, Est African American: 110 mL/min/{1.73_m2} (ref >=60)
GFR, Est Non African American: 95 mL/min/{1.73_m2} (ref >=60)
Globulin: 3 g/dL (calc) (ref 1.9–3.7)
Glucose, Bld: 79 mg/dL (ref 65–99)
Potassium: 4.3 mmol/L (ref 3.5–5.3)
Sodium: 139 mmol/L (ref 135–146)
Total Bilirubin: 0.4 mg/dL (ref 0.2–1.2)
Total Protein: 7.7 g/dL (ref 6.1–8.1)

## 2019-10-10 LAB — LIPID PANEL
Cholesterol: 209 mg/dL — ABNORMAL HIGH (ref <200)
HDL: 50 mg/dL (ref >=40)
LDL Cholesterol (Calc): 118 mg/dL (calc) — ABNORMAL HIGH
Non-HDL Cholesterol (Calc): 159 mg/dL (calc) — ABNORMAL HIGH (ref <130)
Total CHOL/HDL Ratio: 4.2 (calc) (ref <5.0)
Triglycerides: 306 mg/dL — ABNORMAL HIGH (ref <150)

## 2019-10-10 MED ORDER — HUMIRA PEN CITRATE FREE 40 MG/0.4 ML
SUBCUTANEOUS | 5 refills | 28.00000 days | Status: CP
Start: 2019-10-10 — End: 2020-04-07
  Filled 2019-10-18: qty 8, 28d supply, fill #0

## 2019-10-10 NOTE — Unmapped (Signed)
Rx request Humira  Last OV: 02/25/19  Rx pending for approval

## 2019-10-12 ENCOUNTER — Other Ambulatory Visit: Payer: Self-pay | Admitting: Family Medicine

## 2019-10-12 DIAGNOSIS — F419 Anxiety disorder, unspecified: Secondary | ICD-10-CM

## 2019-10-12 MED ORDER — CITALOPRAM HYDROBROMIDE 40 MG PO TABS: 40.0000 mg | ORAL_TABLET | Freq: Every day | ORAL | 3 refills | Status: DC

## 2019-10-12 NOTE — Unmapped (Signed)
[facility] Clinical Assessment & Refill Coordination Note    Reginald Conway, DOB: [birth year]  Phone: [phone] (home)     All above HIPAA information was verified with patient.     Was a Nurse, learning disability used for this call? No    Specialty Medication(s):   Inflammatory Disorders: Humira     Current Outpatient Medications   Medication Sig Dispense Refill   ??? atorvastatin (LIPITOR) 20 MG tablet Take 20 mg by mouth daily.     ??? clindamycin (CLEOCIN) 300 MG capsule Take 1 capsule (300 mg total) by mouth two (2) times a day. 60 capsule 1   ??? empty container Misc Use as directed to dispose of Humira pens. 1 each 2   ??? HUMIRA PEN CITRATE FREE 40 MG/0.4 ML Inject the contents of 2 pens (80 mg total) under the skin once a week. 8 each 5   ??? HUMIRA PEN CITRATE FREE STARTER PACK FOR CROHN'S/UC/HS 3 X 80 MG/0.8 ML Inject  the contents of 2 pens (160 mg) under the skin on day 1, then 1 pen (80 mg) on day 15. Mainenance dosing will begin on day 29. 3 each 0   ??? ISOtretinoin (ACCUTANE) 40 MG capsule Take 1 capsule (40 mg total) by mouth daily. 30 capsule 0   ??? predniSONE (DELTASONE) 10 mg tablet pack Take by mouth.     ??? predniSONE (DELTASONE) 10 mg tablet pack PLEASE SEE ATTACHED FOR DETAILED DIRECTIONS     ??? predniSONE (DELTASONE) 10 MG tablet Take 40mg  daily x7 days, then 30mg  daily x7 days, then 20mg  daily x7 days, then 10mg  daily x7 days, then stop 70 tablet 0   ??? sulfamethoxazole-trimethoprim (BACTRIM DS) 800-160 mg per tablet Take 1 tablet (160 mg of trimethoprim total) by mouth Two (2) times a day. 60 tablet 1     No current facility-administered medications for this visit.        Changes to medications: Reginald Conway reports no changes at this time.    No Known Allergies    Changes to allergies: No    SPECIALTY MEDICATION ADHERENCE     Humira 40/0.4 mg/ml: 7 days of medicine on hand       Medication Adherence    Patient reported X missed doses in the last month: 0  Specialty Medication: Humira   Patient is on additional specialty medications: No  Informant: patient  Confirmed plan for next specialty medication refill: delivery by pharmacy  Refills needed for supportive medications: not needed          Specialty medication(s) dose(s) confirmed: Regimen is correct and unchanged.     Are there any concerns with adherence? No    Adherence counseling provided? Not needed    CLINICAL MANAGEMENT AND INTERVENTION      Clinical Benefit Assessment:    Do you feel the medicine is effective or helping your condition? Yes    Clinical Benefit counseling provided? Not needed    Adverse Effects Assessment:    Are you experiencing any side effects? No    Are you experiencing difficulty administering your medicine? 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 For example, brushing your teeth or getting up in the morning. 0    Have you discussed this with your provider? Not needed    Therapy Appropriateness:    Is therapy appropriate?  Yes, therapy is appropriate and should be continued    DISEASE/MEDICATION-SPECIFIC INFORMATION      For patients on injectable medications: Patient currently has 1 doses left.  Next injection is scheduled for 10/12/19.    PATIENT SPECIFIC NEEDS     - Does the patient have any physical, cognitive, or cultural barriers? No    - Is the patient high risk? No    - Does the patient require a Care Management Plan? No     - Does the patient require physician intervention or other additional services (i.e. nutrition, smoking cessation, social work)? No      SHIPPING     Specialty Medication(s) to be Shipped:   Inflammatory Disorders: Humira    Other medication(s) to be shipped: No additional medications requested for fill at this time     Changes to insurance: No    Delivery Scheduled: Yes, Expected medication delivery date: 10/18/19.     Medication will be delivered via Same Day Courier to the confirmed prescription address in [facility].    The patient will receive a drug information handout for each medication shipped and additional FDA Medication Guides as required.  Verified that patient has previously received a Conservation officer, historic buildings.    All of the patient's questions and concerns have been addressed.    Reginald Conway   [facility] Specialty Pharmacist

## 2019-10-14 ENCOUNTER — Other Ambulatory Visit: Payer: Self-pay | Admitting: Family Medicine

## 2019-10-18 MED FILL — HUMIRA PEN CITRATE FREE 40 MG/0.4 ML: 28 days supply | Qty: 8 | Fill #0 | Status: AC

## 2019-11-04 ENCOUNTER — Other Ambulatory Visit: Payer: Self-pay | Admitting: Family Medicine

## 2019-11-04 NOTE — Unmapped (Signed)
[facility] Refill Coordination Note    Specialty Medication(s) to be Shipped:   Inflammatory Disorders: Humira    Other medication(s) to be shipped: No additional medications requested for fill at this time     Reginald Conway, DOB: Feb 22, 1985  Phone: [phone] (home)       All above HIPAA information was verified with patient.     Was a Nurse, learning disability used for this call? No    Completed refill call assessment today to schedule patient's medication shipment from the [facility] [phone]).       Specialty medication(s) and dose(s) confirmed: Regimen is correct and unchanged.   Changes to medications: Reginald Conway reports no changes at this time.  Changes to insurance: No  Questions for the pharmacist: No    Confirmed patient received Welcome Packet with first shipment. The patient will receive a drug information handout for each medication shipped and additional FDA Medication Guides as required.       DISEASE/MEDICATION-SPECIFIC INFORMATION        For patients on injectable medications: Patient currently has 1 doses left.  Next injection is scheduled for 11/09/2019.    SPECIALTY MEDICATION ADHERENCE     Medication Adherence    Patient reported X missed doses in the last month: 0  Specialty Medication: Humira CF 40 mg/0.4 ml  Patient is on additional specialty medications: No  Any gaps in refill history greater than 2 weeks in the last 3 months: no  Demonstrates understanding of importance of adherence: yes  Informant: patient  Reliability of informant: reliable  Confirmed plan for next specialty medication refill: delivery by pharmacy  Refills needed for supportive medications: not needed                Humira 40mg /0.14ml: Patient has 7 days of medication on hand      SHIPPING     Shipping address confirmed in Epic.     Delivery Scheduled: Yes, Expected medication delivery date: 11/11/2019.     Medication will be delivered via Same Day Courier to the prescription address in Epic WAM.    Reginald Conway   [facility] Specialty Technician

## 2019-11-11 MED FILL — HUMIRA PEN CITRATE FREE 40 MG/0.4 ML: SUBCUTANEOUS | 28 days supply | Qty: 8 | Fill #1

## 2019-11-11 MED FILL — HUMIRA PEN CITRATE FREE 40 MG/0.4 ML: 28 days supply | Qty: 8 | Fill #1 | Status: AC

## 2019-11-24 ENCOUNTER — Other Ambulatory Visit: Payer: Self-pay | Admitting: Family Medicine

## 2019-11-24 DIAGNOSIS — F419 Anxiety disorder, unspecified: Secondary | ICD-10-CM

## 2019-12-06 NOTE — Unmapped (Signed)
[facility] Refill Coordination Note    Specialty Medication(s) to be Shipped:   Inflammatory Disorders: Humira    Other medication(s) to be shipped: No additional medications requested for fill at this time     Claris Guymon, DOB: 01/10/1985  Phone: [phone] (home)       All above HIPAA information was verified with patient.     Was a Nurse, learning disability used for this call? No    Completed refill call assessment today to schedule patient's medication shipment from the [facility] [phone]).       Specialty medication(s) and dose(s) confirmed: Regimen is correct and unchanged.   Changes to medications: Nhia Heaphy reports no changes at this time.  Changes to insurance: No  Questions for the pharmacist: No    Confirmed patient received Welcome Packet with first shipment. The patient will receive a drug information handout for each medication shipped and additional FDA Medication Guides as required.       DISEASE/MEDICATION-SPECIFIC INFORMATION        For patients on injectable medications: Patient currently has 1 doses left.  Next injection is scheduled for 12/07/2019.    SPECIALTY MEDICATION ADHERENCE     Medication Adherence    Patient reported X missed doses in the last month: 0  Specialty Medication: Humira CF 40 mg/0.4 ml  Patient is on additional specialty medications: No  Any gaps in refill history greater than 2 weeks in the last 3 months: no  Demonstrates understanding of importance of adherence: yes  Informant: patient  Reliability of informant: reliable  Confirmed plan for next specialty medication refill: delivery by pharmacy  Refills needed for supportive medications: not needed                Humira 40mg /0.100ml: Patient has 7 days of medication on hand      SHIPPING     Shipping address confirmed in Epic.     Delivery Scheduled: Yes, Expected medication delivery date: 12/09/2019.     Medication will be delivered via Same Day Courier to the prescription address in Epic WAM.    Macedonio Scallon D Emer Onnen   [facility] Specialty Technician

## 2019-12-09 MED FILL — HUMIRA PEN CITRATE FREE 40 MG/0.4 ML: SUBCUTANEOUS | 28 days supply | Qty: 8 | Fill #2

## 2019-12-09 MED FILL — HUMIRA PEN CITRATE FREE 40 MG/0.4 ML: 28 days supply | Qty: 8 | Fill #2 | Status: AC

## 2019-12-24 ENCOUNTER — Other Ambulatory Visit: Payer: Self-pay | Admitting: Family Medicine

## 2019-12-24 DIAGNOSIS — F419 Anxiety disorder, unspecified: Secondary | ICD-10-CM

## 2020-01-03 NOTE — Unmapped (Signed)
The [facility] has made a second and final attempt to reach this patient to refill the following medication:Humira.      We have left voicemails on the following phone numbers: [phone] and have sent a MyChart message.    Dates contacted: 12/23 and 12/28  Last scheduled delivery: 12/09/2019    The patient may be at risk of non-compliance with this medication. The patient should call the [facility] at [phone] (option 4) to refill medication.    Annissa Andreoni D Administrator [facility] Specialty Technician

## 2020-01-04 ENCOUNTER — Ambulatory Visit: Payer: Medicaid Other | Admitting: Family Medicine

## 2020-01-19 ENCOUNTER — Encounter: Payer: Self-pay | Admitting: Family Medicine

## 2020-01-25 ENCOUNTER — Encounter: Payer: Self-pay | Admitting: Family Medicine

## 2020-01-25 ENCOUNTER — Telehealth (INDEPENDENT_AMBULATORY_CARE_PROVIDER_SITE_OTHER): Payer: Medicaid Other | Admitting: Family Medicine

## 2020-01-25 VITALS — Ht 69.0 in | Wt 220.0 lb

## 2020-01-25 DIAGNOSIS — J4521 Mild intermittent asthma with (acute) exacerbation: Secondary | ICD-10-CM

## 2020-01-25 DIAGNOSIS — R0989 Other specified symptoms and signs involving the circulatory and respiratory systems: Secondary | ICD-10-CM | POA: Diagnosis not present

## 2020-01-25 DIAGNOSIS — J324 Chronic pansinusitis: Secondary | ICD-10-CM

## 2020-01-25 MED ORDER — PROAIR HFA 108 (90 BASE) MCG/ACT IN AERS
2.0000 | INHALATION_SPRAY | RESPIRATORY_TRACT | 2 refills | Status: DC | PRN
Start: 2020-01-25 — End: 2020-11-07

## 2020-01-25 MED ORDER — PREDNISONE 20 MG PO TABS: 40.0000 mg | ORAL_TABLET | Freq: Every day | ORAL | 0 refills | Status: AC

## 2020-01-25 MED ORDER — DOXYCYCLINE HYCLATE 100 MG PO CAPS: 100.0000 mg | ORAL_CAPSULE | Freq: Two times a day (BID) | ORAL | 0 refills | Status: AC

## 2020-01-25 NOTE — Progress Notes (Signed)
Name: Mario Boyd   MRN: [id number]    DOB: [birth year]   Date:01/25/2020       Progress Note  Subjective:    Chief Complaint  Chief Complaint  Patient presents with  . Sinusitis    Onset 2 weeks, and has been tested negative twice for covid. sinus headache, sob and feels like it is going into chest with raspy cough    I connected with  Mario Boyd on 01/25/20 at  3:20 PM EST by telephone and verified that I am speaking with the correct person using two identifiers.   I discussed the limitations, risks, security and privacy concerns of performing an evaluation and management service by telephone and the availability of in person appointments. Staff also discussed with the patient that there may be a patient responsible charge related to this service.  Patient verbalized understanding and agreed to proceed with encounter. Patient Location: home Provider Location: home office Additional Individuals present: none  URI  This is a recurrent problem. Episode onset: 2 weeks ago started with sinus. The problem has been gradually worsening. There has been no fever. Associated symptoms include congestion, coughing, headaches, rhinorrhea, sinus pain, a sore throat and wheezing. Pertinent negatives include no abdominal pain, chest pain, diarrhea, dysuria, ear pain, joint pain, joint swelling, nausea, neck pain, plugged ear sensation, rash, sneezing, swollen glands or vomiting. Treatments tried: nyquil. The treatment provided no relief.  Cough This is a new problem. The current episode started in the past 7 days. The problem has been gradually worsening. Associated symptoms include headaches, nasal congestion, postnasal drip, rhinorrhea, a sore throat, shortness of breath and wheezing. Pertinent negatives include no chest pain, chills, ear congestion, ear pain, fever, heartburn, hemoptysis, myalgias, rash, sweats or weight loss. He has tried rest and OTC cough suppressant for the symptoms. The  treatment provided no relief. His past medical history is significant for asthma and pneumonia.   Hx of chronic sinus issues, recurrent infections and immunocompromised and susceptible to bacterial sinus infections due to humira Sx start 2 weeks ago, tried OTC meds, no improvement, cough and chest congestion worsened over the past couple days to week, with walking short distances or showering he has coughing fits, his heart races, and he feels short of breath.  Hx of asthma and used to get pneumonia every year, but hasn't for the past 3 years or so.    Patient Active Problem List   Diagnosis Date Noted  . Abnormal transaminases 08/04/2019  . Psychophysiological insomnia 08/04/2019  . Anxiety disorder 08/04/2019  . Hyperlipidemia 05/24/2019  . Myalgia due to statin 05/24/2019  . Immunocompromised state due to drug therapy ([place]) 01/24/2019  . Cervical lymphadenopathy 01/24/2019  . Chronic rhinitis 01/24/2019  . Pansinusitis 01/24/2019  . Class 1 obesity due to excess calories with serious comorbidity and body mass index (BMI) of 32.0 to 32.9 in adult 09/17/2018  . Elevated LFTs 09/17/2018  . Hypertriglyceridemia 09/17/2018  . History of nephrectomy, right 09/17/2018  . Hidradenitis suppurativa 09/17/2018  . Thrombocytosis 09/17/2018    Social History   Tobacco Use  . Smoking status: Former Smoker    Packs/day: 0.50    Years: 15.00    Pack years: 7.50    Types: Cigarettes  . Smokeless tobacco: Never Used  Substance Use Topics  . Alcohol use: Never     Current Outpatient Medications:  .  Adalimumab (HUMIRA PEN) 40 MG/0.4ML PNKT, Inject 80 mg into the skin daily., Disp: , Rfl:  .  busPIRone (BUSPAR) 5 MG tablet, Take 1-2 tablets (5-10 mg total) by mouth 3 (three) times daily as needed., Disp: 90 tablet, Rfl: 1 .  citalopram (CELEXA) 40 MG tablet, Take 1 tablet (40 mg total) by mouth daily., Disp: 90 tablet, Rfl: 3 .  cromolyn (OPTICROM) 4 % ophthalmic solution, INSTILL 1 DROP  INTO BOTH EYES 4 TIMES A DAY, Disp: 10 mL, Rfl: 0 .  ezetimibe (ZETIA) 10 MG tablet, Take 1 tablet (10 mg total) by mouth daily., Disp: 90 tablet, Rfl: 3 .  fluticasone (FLONASE) 50 MCG/ACT nasal spray, Place 2 sprays into both nostrils daily., Disp: 16 g, Rfl: 11 .  montelukast (SINGULAIR) 10 MG tablet, TAKE 1 TABLET BY MOUTH EVERYDAY AT BEDTIME, Disp: 90 tablet, Rfl: 3 .  rosuvastatin (CRESTOR) 5 MG tablet, Take 1 tablet (5 mg total) by mouth daily., Disp: 90 tablet, Rfl: 3  No Known Allergies  Chart Review: I personally reviewed active problem list, medication list, allergies, family history, social history, health maintenance, notes from last encounter, lab results, imaging with the patient/caregiver today.   Review of Systems  Constitutional: Negative.  Negative for chills, fever and weight loss.  HENT: Positive for congestion, postnasal drip, rhinorrhea, sinus pain and sore throat. Negative for ear pain and sneezing.   Eyes: Negative.   Respiratory: Positive for cough, shortness of breath and wheezing. Negative for hemoptysis.   Cardiovascular: Negative.  Negative for chest pain.  Gastrointestinal: Negative.  Negative for abdominal pain, diarrhea, heartburn, nausea and vomiting.  Endocrine: Negative.   Genitourinary: Negative.  Negative for dysuria.  Musculoskeletal: Negative.  Negative for joint pain, myalgias and neck pain.  Skin: Negative.  Negative for rash.  Allergic/Immunologic: Negative.   Neurological: Positive for headaches.  Hematological: Negative.   Psychiatric/Behavioral: Negative.   All other systems reviewed and are negative.    Objective:    Virtual encounter, vitals limited, only able to obtain the following Today's Vitals   01/25/20 1524  Weight: 220 lb (99.8 kg)  Height: 5\' 9"  (1.753 m)   Body mass index is 32.49 kg/m. Nursing Note and Vital Signs reviewed.  Physical Exam Vitals and nursing note reviewed.  Constitutional:      General: He is not  in acute distress.    Appearance: Normal appearance. He is well-developed. He is obese. He is not toxic-appearing or diaphoretic.  HENT:     Head: Normocephalic and atraumatic.     Right Ear: External ear normal.     Left Ear: External ear normal.  Eyes:     General:        Right eye: No discharge.        Left eye: No discharge.     Conjunctiva/sclera: Conjunctivae normal.  Neck:     Trachea: No tracheal deviation.  Pulmonary:     Effort: Pulmonary effort is normal. No tachypnea, accessory muscle usage, respiratory distress or retractions.  Skin:    Coloration: Skin is not jaundiced or pale.     Findings: No rash.  Neurological:     Mental Status: He is alert.  Psychiatric:        Attention and Perception: Attention normal.        Mood and Affect: Mood normal.        Speech: Speech normal.        Behavior: Behavior normal. Behavior is cooperative.     PE limited by telephone encounter  No results found for this or any previous visit (from the past 72  hour(s)).  Assessment and Plan:     ICD-10-CM   1. Symptoms of upper respiratory infection (URI)  R09.89 Novel Coronavirus, NAA ([facility])   rapid testing done for covid, recommend molecular test  2. Pansinusitis, unspecified chronicity  J32.4 doxycycline (VIBRAMYCIN) 100 MG capsule   higher risk for bacterial sinusitis, tx with doxy, continue other supportive OTC management  3. Mild intermittent asthmatic bronchitis with acute exacerbation  J45.21 predniSONE (DELTASONE) 20 MG tablet    PROAIR HFA 108 (90 Base) MCG/ACT inhaler   cough, congestion, SOB, hx of asthma and pneumonia, doxy to cover lungs to atypical PNA, steroids and inhaler for asthma/bronchitis    -Red flags and when to present for emergency care or RTC including but not limited to new/worsening/un-resolving symptoms, reviewed with patient at time of visit. Follow up and care instructions discussed and provided in AVS. - I discussed the assessment and treatment  plan with the patient. The patient was provided an opportunity to ask questions and all were answered. The patient agreed with the plan and demonstrated an understanding of the instructions.  - The patient was advised to call back or seek an in-person evaluation if the symptoms worsen or if the condition fails to improve as anticipated.  I provided 20+ minutes of non-face-to-face time during this encounter.  Delsa Grana, PA-C 01/25/20 4:12 PM

## 2020-03-09 ENCOUNTER — Ambulatory Visit: Payer: Self-pay | Admitting: Family Medicine

## 2020-03-27 NOTE — Unmapped (Signed)
This patient has been disenrolled from the [facility] specialty pharmacy services due to multiple unsuccessful outreach attempts by the pharmacy.    Lanney Gins  [facility] Specialty Pharmacist

## 2020-03-28 ENCOUNTER — Ambulatory Visit: Payer: Medicaid Other | Admitting: Physician Assistant

## 2020-04-25 ENCOUNTER — Ambulatory Visit: Payer: Medicaid Other | Admitting: Family Medicine

## 2020-04-25 ENCOUNTER — Encounter: Payer: Self-pay | Admitting: Family Medicine

## 2020-04-25 ENCOUNTER — Other Ambulatory Visit: Payer: Self-pay

## 2020-04-25 VITALS — BP 130/70 | HR 73 | Temp 98.2°F | Resp 16 | Ht 69.0 in | Wt 235.1 lb

## 2020-04-25 DIAGNOSIS — D75839 Thrombocytosis, unspecified: Secondary | ICD-10-CM

## 2020-04-25 DIAGNOSIS — F909 Attention-deficit hyperactivity disorder, unspecified type: Secondary | ICD-10-CM | POA: Diagnosis not present

## 2020-04-25 DIAGNOSIS — R29818 Other symptoms and signs involving the nervous system: Secondary | ICD-10-CM

## 2020-04-25 DIAGNOSIS — E785 Hyperlipidemia, unspecified: Secondary | ICD-10-CM

## 2020-04-25 DIAGNOSIS — F419 Anxiety disorder, unspecified: Secondary | ICD-10-CM

## 2020-04-25 DIAGNOSIS — R635 Abnormal weight gain: Secondary | ICD-10-CM | POA: Diagnosis not present

## 2020-04-25 DIAGNOSIS — Z0289 Encounter for other administrative examinations: Secondary | ICD-10-CM

## 2020-04-25 DIAGNOSIS — E669 Obesity, unspecified: Secondary | ICD-10-CM

## 2020-04-25 DIAGNOSIS — J4521 Mild intermittent asthma with (acute) exacerbation: Secondary | ICD-10-CM | POA: Diagnosis not present

## 2020-04-25 DIAGNOSIS — E781 Pure hyperglyceridemia: Secondary | ICD-10-CM | POA: Diagnosis not present

## 2020-04-25 DIAGNOSIS — L732 Hidradenitis suppurativa: Secondary | ICD-10-CM | POA: Diagnosis not present

## 2020-04-25 DIAGNOSIS — F5104 Psychophysiologic insomnia: Secondary | ICD-10-CM | POA: Diagnosis not present

## 2020-04-25 DIAGNOSIS — Z5181 Encounter for therapeutic drug level monitoring: Secondary | ICD-10-CM

## 2020-04-25 DIAGNOSIS — Z6834 Body mass index (BMI) 34.0-34.9, adult: Secondary | ICD-10-CM

## 2020-04-25 MED ORDER — BUSPIRONE HCL 5 MG PO TABS: 5.0000 mg | ORAL_TABLET | Freq: Three times a day (TID) | ORAL | 1 refills | Status: DC | PRN

## 2020-04-25 NOTE — Progress Notes (Signed)
Name: Mario Boyd   MRN: [id number]    DOB: March 05, 1985   Date:04/25/2020       Progress Note  Chief Complaint  Patient presents with  . Follow-up     Subjective:   Mario Boyd is a 35 y.o. 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 He never est with psych last year, not doing any therapy Depression screen [facility] 2/9 04/25/2020 01/25/2020 10/05/2019  Decreased Interest 0 0 0  Down, Depressed, Hopeless 0 0 0  PHQ - 2 Score 0 0 0  Altered sleeping - 0 3  Tired, decreased energy - 0 2  Change in appetite - 0 2  Feeling bad or failure about yourself  - 0 0  Trouble concentrating - 0 2  Moving slowly or fidgety/restless - 0 0  Suicidal thoughts - 0 0  PHQ-9 Score - 0 9  Difficult doing work/chores - Not difficult at all Somewhat difficult   GAD 7 : Generalized Anxiety Score 04/25/2020 10/05/2019 06/21/2019  Nervous, Anxious, on Edge 0 2 3  Control/stop worrying 0 1 2  Worry too much - different things 0 2 2  Trouble relaxing 0 2 3  Restless 1 0 3  Easily annoyed or irritable 0 0 2  Afraid - awful might happen 0 0 3  Total GAD 7 Score 1 7 18   Anxiety Difficulty Not difficult at all - Extremely difficult  paperwork completed and will be scanned in   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 - stopped by himself in Jan  Sinus sx have improved significantly since stopping, he has not f/up   Hyperlipidemia: Currently treated with zetia and crestor 5 mg, pt reports good med compliance Last  Lipids: Lab Results  Component Value Date   CHOL 209 (H) 10/10/2019   HDL 50 10/10/2019   LDLCALC 118 (H) 10/10/2019   TRIG 306 (H) 10/10/2019   CHOLHDL 4.2 10/10/2019   - Denies: Chest pain, shortness of breath, myalgias, claudication   Weight gain w/o trying or w/o any changes - he is more active now than previously and not eating more Wt Readings from Last 10 Encounters:  04/25/20 235 lb 1.6 oz (106.6 kg)  01/25/20 220 lb (99.8 kg)  10/05/19 222 lb 4.8 oz (100.8 kg)  08/04/19 (!) 218 lb 11.2 oz (99.2 kg)  06/21/19 222 lb 1.6 oz (100.7 kg)  05/24/19 222 lb 8 oz (100.9 kg)  01/24/19 224 lb 1.6 oz (101.7 kg)  12/22/18 220 lb 1.6 oz (99.8 kg)  12/17/18 221 lb 6.4 oz (100.4 kg)  10/11/18 216 lb (98 kg)   BMI Readings from Last 5 Encounters:  04/25/20 34.72 kg/m  01/25/20 32.49 kg/m  10/05/19 32.83 kg/m  08/04/19 32.30 kg/m  06/21/19 32.80 kg/m  no change to eating or diet, but gained weight in the last three months w/o changes, more active  Suspected sleep apnea, insomnia sleeping about 4 hours of sleep, no trouble getting to sleep, wakes from sleep No change to  energy or amount of sleep weight recently changed     Current Outpatient Medications:  .  citalopram (CELEXA) 40 MG tablet, Take 1 tablet (40 mg total) by mouth daily., Disp: 90 tablet, Rfl: 3 .  ezetimibe (ZETIA) 10 MG tablet, Take 1 tablet (10 mg total) by mouth daily., Disp: 90 tablet, Rfl: 3 .  fluticasone (FLONASE) 50 MCG/ACT nasal spray, Place 2 sprays into both nostrils daily., Disp: 16 g, Rfl: 11 .  montelukast (SINGULAIR) 10 MG tablet, TAKE 1 TABLET BY MOUTH EVERYDAY AT BEDTIME, Disp: 90 tablet, Rfl: 3 .  PROAIR HFA 108 (90 Base) MCG/ACT inhaler, Inhale 2 puffs into the lungs every 4 (four) hours as needed for wheezing or shortness of breath., Disp: 1 each, Rfl: 2 .  rosuvastatin (CRESTOR) 5 MG tablet, Take 1 tablet (5 mg total) by mouth daily., Disp: 90 tablet, Rfl: 3 .  Adalimumab (HUMIRA PEN) 40  MG/0.4ML PNKT, Inject 80 mg into the skin daily., Disp: , Rfl:  .  busPIRone (BUSPAR) 5 MG tablet, Take 1-2 tablets (5-10 mg total) by mouth 3 (three) times daily as needed. (Patient not taking: Reported on 04/25/2020), Disp: 90 tablet, Rfl: 1 .  cromolyn (OPTICROM) 4 % ophthalmic solution, INSTILL 1 DROP INTO BOTH EYES 4 TIMES A DAY (Patient not taking: Reported on 04/25/2020), Disp: 10 mL, Rfl: 0  Patient Active Problem List   Diagnosis Date Noted  . Abnormal transaminases 08/04/2019  . Psychophysiological insomnia 08/04/2019  . Anxiety disorder 08/04/2019  . Hyperlipidemia 05/24/2019  . Myalgia due to statin 05/24/2019  . Immunocompromised state due to drug therapy ([place]) 01/24/2019  . Cervical lymphadenopathy 01/24/2019  . Chronic rhinitis 01/24/2019  . Pansinusitis 01/24/2019  . Class 1 obesity due to excess calories with serious comorbidity and body mass index (BMI) of 32.0 to 32.9 in adult 09/17/2018  . Elevated LFTs 09/17/2018  . Hypertriglyceridemia 09/17/2018  . History of nephrectomy, right 09/17/2018  . Hidradenitis suppurativa 09/17/2018  . Thrombocytosis 09/17/2018    Past Surgical History:  Procedure Laterality Date  . HERNIA REPAIR    . KIDNEY SURGERY    . NEPHRECTOMY LIVING DONOR Right 2009  . TONSILLECTOMY    . TYMPANOSTOMY TUBE PLACEMENT      Family History  Problem Relation Age of Onset  . Hyperlipidemia Mother   . Hypertension Mother   . Other Mother        thrombocytosis  . Hyperlipidemia Father   . Hypertension Father   . Epilepsy Sister   . Hypertension Sister   . Epilepsy Son   . Heart failure Maternal Grandmother   . Diabetes Maternal Grandfather   . Heart failure Maternal Grandfather   . Other Maternal Grandfather        thrombocytosis  . Diabetes Paternal Grandmother   . Hypertension Paternal Grandfather   . Prostate cancer Paternal Grandfather     Social History   Tobacco Use  . Smoking status: Former Smoker    Packs/day: 0.50     Years: 15.00    Pack years: 7.50    Types: Cigarettes  . Smokeless tobacco: Never Used  Vaping Use  . Vaping Use: Every day  . Substances: Nicotine, Flavoring  Substance Use Topics  . Alcohol use: Never  . Drug use: Never     No Known Allergies  Health Maintenance  Topic Date Due  . COVID-19 Vaccine (3 - Inadvertent risk 4-dose series) 05/24/2019  . HIV Screening  05/23/2020 (Originally 08/04/1998)  . INFLUENZA VACCINE  08/06/2020  . TETANUS/TDAP  12/16/2028  . Hepatitis C Screening  Completed  . HPV VACCINES  Aged Out    Chart Review Today: I personally reviewed active problem list, medication list, allergies, family history, social history, health maintenance, notes from last encounter, lab results, imaging with the patient/caregiver today.   Review of Systems  Al lother systems reviewed and are negative for acute change except as noted in the HPI.  Objective:   Vitals:   04/25/20 1133  BP: 130/70  Pulse: 73  Resp: 16  Temp: 98.2 F (36.8 C)  SpO2: 98%  Weight: 235 lb 1.6 oz (106.6 kg)  Height: 5\' 9"  (1.753 m)    Body mass index is 34.72 kg/m.  Physical Exam Vitals and nursing note reviewed.  Constitutional:      General: He is not in acute distress.    Appearance: Normal appearance. He is well-developed. He is obese. He is not ill-appearing, toxic-appearing or diaphoretic.     Interventions: Face mask in place.  HENT:     Head: Normocephalic and atraumatic.     Jaw: No trismus.     Right Ear: External ear normal.     Left Ear: External ear normal.  Eyes:     General: Lids are normal. No scleral icterus.       Right eye: No discharge.        Left eye: No discharge.     Conjunctiva/sclera: Conjunctivae normal.  Neck:     Trachea: Trachea and phonation normal. No tracheal deviation.  Cardiovascular:     Rate and Rhythm: Normal rate and regular rhythm.     Pulses: Normal pulses.          Radial pulses are 2+ on the right side and 2+ on the left side.        Posterior tibial pulses are 2+ on the right side and 2+ on the left side.     Heart sounds: Normal heart sounds. No murmur heard. No friction rub. No gallop.   Pulmonary:     Effort: Pulmonary effort is normal. No respiratory distress.     Breath sounds: Normal breath sounds. No stridor. No wheezing, rhonchi or rales.  Abdominal:     General: Bowel sounds are normal. There is no distension.     Palpations: Abdomen is soft.  Musculoskeletal:     Right lower leg: No edema.     Left lower leg: No edema.  Skin:    General: Skin is warm and dry.     Coloration: Skin is not jaundiced.     Findings: No rash.     Nails: There is no clubbing.  Neurological:     Mental Status: He is alert. Mental status is at baseline.     Cranial Nerves: No dysarthria or facial asymmetry.     Motor: No tremor or abnormal muscle tone.     Gait: Gait normal.  Psychiatric:        Attention and Perception: Attention and perception normal.        Mood and Affect: Mood and affect normal.        Speech: Speech normal.        Behavior: Behavior normal. Behavior is cooperative.        Thought Content: Thought content normal. Thought content does not include homicidal or suicidal ideation. Thought content does not include homicidal or suicidal plan.         Assessment & Plan:  ICD-10-CM   1. Anxiety disorder, unspecified type  F41.9 busPIRone (BUSPAR) 5 MG tablet    Ambulatory referral to Psychiatry   sx much improved, he is desiring to start job in high stress environment, I signed pt today, put is functional, normal mood/affect, recommend he continue meds  2. Hypertriglyceridemia  E78.1 Lipid Panel w/reflex Direct LDL    COMPLETE METABOLIC PANEL WITH GFR   recheck  3. Hyperlipidemia, unspecified hyperlipidemia type  E78.5 Lipid Panel w/reflex Direct LDL    COMPLETE METABOLIC PANEL WITH GFR   recheck - good statin compliance, does not tolerate other statin meds or higher doses  4.  Psychophysiological insomnia  F51.04    no change - sleeps about 4 hours a night, no improvement with better management of anxiety, r/o OSA  5. Hidradenitis suppurativa  L73.2    pt took himself off meds, did not f/up with specialists - encouraged him to call and do f/up if HA/dissecting cellulitis worsens  6. Anxiety disorder, unspecified type  F41.9 busPIRone (BUSPAR) 5 MG tablet    Ambulatory referral to Psychiatry   start celexa, can use buspar PRN, will still need to establish with psych/therapy due to severity of sx.    7. Hypercalcemia  L57.26 COMPLETE METABOLIC PANEL WITH GFR    VITAMIN D 25 Hydroxy (Vit-D Deficiency, Fractures)    Parathyroid hormone, intact (no Ca)   last several labs, not on any supplements  8. Weight gain  R63.5 Lipid Panel w/reflex Direct LDL    CBC with Differential/Platelet    COMPLETE METABOLIC PANEL WITH GFR    TSH    Ambulatory referral to Pulmonology   see below r/o hypothyroid  9. Attention deficit hyperactivity disorder (ADHD), unspecified ADHD type  F90.9    may want to discuss with psych for assessment and/or management  10. Thrombocytosis  D75.839 CBC with Differential/Platelet   chronic, prior hematology consult - monitoring  11. Mild intermittent asthmatic bronchitis with acute exacerbation  J45.21    currently well controlled, even with spring allergies  12. Suspected sleep apnea  R29.818 Ambulatory referral to Pulmonology   ref for sleep study  13. Class 1 obesity with body mass index (BMI) of 34.0 to 34.9 in adult, unspecified obesity type, unspecified whether serious comorbidity present  E66.9    Z68.34    weight gain, encouraged him to monitor caloric intake, ensure its <2000 cal/ day, metabolic dysfunction?  worse with OSA?  14. Encounter for medication monitoring  Z51.81 Lipid Panel w/reflex Direct LDL    CBC with Differential/Platelet    COMPLETE METABOLIC PANEL WITH GFR    VITAMIN D 25 Hydroxy (Vit-D Deficiency, Fractures)    TSH     Parathyroid hormone, intact (no Ca)  15. Encounter for completion of form with patient  Z02.89    see forms scanned into chart - cleared with discussion with pt that work environment may worsen anxiety and he needs to est with psych and continue meds     Return in about 6 months (around 10/25/2020) for Routine follow-up.   Delsa Grana, PA-C 04/25/20 11:46 AM

## 2020-04-25 NOTE — Patient Instructions (Signed)
   [facility] - Address: [address], [place] - Telephone: [phone]  - Hours of Operation: Sunday - Saturday - 8:00 a.m. - 8:00 p.m. - Medicaid, Medicare (Government Issued Only), BCBS, and [place] [facility], [place], Psychiatrists on-site to provide medication management, [place], and Peer Support Care.  Look up and can call [facility] to get an appt- Address: [address] [place], [place] [address] Phone: [phone]    National Mobile Crisis: [phone] - [place] available 24 hours a day, 365 days a year. - Available for anyone of any age in [place]

## 2020-04-26 LAB — CBC WITH DIFFERENTIAL/PLATELET
Absolute Monocytes: 840 cells/uL (ref 200–950)
Basophils Absolute: 147 cells/uL (ref 0–200)
Basophils Relative: 1.4 %
Eosinophils Absolute: 179 cells/uL (ref 15–500)
Eosinophils Relative: 1.7 %
HCT: 49.1 % (ref 38.5–50.0)
Hemoglobin: 16.5 g/dL (ref 13.2–17.1)
Lymphs Abs: 4022 cells/uL — ABNORMAL HIGH (ref 850–3900)
MCH: 31.4 pg (ref 27.0–33.0)
MCHC: 33.6 g/dL (ref 32.0–36.0)
MCV: 93.3 fL (ref 80.0–100.0)
MPV: 9.7 fL (ref 7.5–12.5)
Monocytes Relative: 8 %
Neutro Abs: 5313 cells/uL (ref 1500–7800)
Neutrophils Relative %: 50.6 %
Platelets: 470 10*3/uL — ABNORMAL HIGH (ref 140–400)
RBC: 5.26 10*6/uL (ref 4.20–5.80)
RDW: 12.1 % (ref 11.0–15.0)
Total Lymphocyte: 38.3 %
WBC: 10.5 10*3/uL (ref 3.8–10.8)

## 2020-04-26 LAB — COMPLETE METABOLIC PANEL WITH GFR
AG Ratio: 1.6 (calc) (ref 1.0–2.5)
ALT: 110 U/L — ABNORMAL HIGH (ref 9–46)
AST: 64 U/L — ABNORMAL HIGH (ref 10–40)
Albumin: 4.7 g/dL (ref 3.6–5.1)
Alkaline phosphatase (APISO): 93 U/L (ref 36–130)
BUN: 12 mg/dL (ref 7–25)
CO2: 27 mmol/L (ref 20–32)
Calcium: 10.6 mg/dL — ABNORMAL HIGH (ref 8.6–10.3)
Chloride: 99 mmol/L (ref 98–110)
Creat: 1.08 mg/dL (ref 0.60–1.35)
GFR, Est African American: 102 mL/min/{1.73_m2} (ref >=60)
GFR, Est Non African American: 88 mL/min/{1.73_m2} (ref >=60)
Globulin: 3 g/dL (calc) (ref 1.9–3.7)
Glucose, Bld: 80 mg/dL (ref 65–99)
Potassium: 4.9 mmol/L (ref 3.5–5.3)
Sodium: 138 mmol/L (ref 135–146)
Total Bilirubin: 0.6 mg/dL (ref 0.2–1.2)
Total Protein: 7.7 g/dL (ref 6.1–8.1)

## 2020-04-26 LAB — LIPID PANEL W/REFLEX DIRECT LDL
Cholesterol: 184 mg/dL (ref <200)
HDL: 52 mg/dL (ref >=40)
LDL Cholesterol (Calc): 80 mg/dL (calc)
Non-HDL Cholesterol (Calc): 132 mg/dL (calc) — ABNORMAL HIGH (ref <130)
Total CHOL/HDL Ratio: 3.5 (calc) (ref <5.0)
Triglycerides: 392 mg/dL — ABNORMAL HIGH (ref <150)

## 2020-04-26 LAB — VITAMIN D 25 HYDROXY (VIT D DEFICIENCY, FRACTURES): Vit D, 25-Hydroxy: 24 ng/mL — ABNORMAL LOW (ref 30–100)

## 2020-04-26 LAB — TSH: TSH: 3.15 mIU/L (ref 0.40–4.50)

## 2020-04-26 LAB — PARATHYROID HORMONE, INTACT (NO CA): PTH: 80 pg/mL — ABNORMAL HIGH (ref 16–77)

## 2020-05-03 ENCOUNTER — Other Ambulatory Visit: Payer: Self-pay | Admitting: Family Medicine

## 2020-05-03 ENCOUNTER — Telehealth: Payer: Self-pay

## 2020-05-03 DIAGNOSIS — R7989 Other specified abnormal findings of blood chemistry: Secondary | ICD-10-CM

## 2020-05-03 NOTE — Telephone Encounter (Signed)
Copied from [place] [phone]. Topic: Quick Communication - Lab Results (Clinic Use ONLY) >> May 03, 2020  2:56 PM Scherrie Gerlach wrote: Pt would like call back to discuss labs.  Has more questions

## 2020-05-04 NOTE — Telephone Encounter (Signed)
Sent through mychart

## 2020-05-04 NOTE — Telephone Encounter (Signed)
Pt called back you told him to stop singulair/monteklast but his allergies are acting up.  What else can he take instead? And is melatonin ok to take?

## 2020-05-06 ENCOUNTER — Other Ambulatory Visit: Payer: Self-pay | Admitting: Family Medicine

## 2020-05-06 DIAGNOSIS — F419 Anxiety disorder, unspecified: Secondary | ICD-10-CM

## 2020-05-30 ENCOUNTER — Other Ambulatory Visit: Payer: Self-pay

## 2020-05-30 DIAGNOSIS — R7989 Other specified abnormal findings of blood chemistry: Secondary | ICD-10-CM

## 2020-05-31 ENCOUNTER — Ambulatory Visit: Payer: Medicaid Other | Admitting: Family Medicine

## 2020-06-04 NOTE — Progress Notes (Deleted)
@Patient  ID: Mario Boyd, male    DOB: 08-22-1985, 35 y.o.   MRN: [id number]  No chief complaint on file.   Referring provider: Delsa Grana, PA-C  HPI: 35 year old male, former smoker. PMH significant for chronic rhinitis, class 1 obesity, hyperlipidemia, insomnia.   06/05/2020 Patient presents today for sleep consult. He reports symptoms of weight gain and snoring.     Sleep questionnaire Previous sleep studies- Symptoms- Bed time- Time to fall asleep- Nocturnal awakenings- Out of bed in morning- Weight change- Epworth score-     No Known Allergies  Immunization History  Administered Date(s) Administered  . Influenza,inj,Quad PF,6+ Mos 09/17/2018, 10/05/2019  . Moderna Sars-Covid-2 Vaccination 04/05/2019, 04/26/2019  . Tdap 12/17/2018    Past Medical History:  Diagnosis Date  . Asthma    childhood  . Hidradenitis suppurativa   . Hyperlipidemia   . Seizures ([place])    as a child    Tobacco History: Social History   Tobacco Use  Smoking Status Former Smoker  . Packs/day: 0.50  . Years: 15.00  . Pack years: 7.50  . Types: Cigarettes  Smokeless Tobacco Never Used   Counseling given: Not Answered   Outpatient Medications Prior to Visit  Medication Sig Dispense Refill  . busPIRone (BUSPAR) 5 MG tablet TAKE 1-2 TABLETS (5-10 MG TOTAL) BY MOUTH 3 (THREE) TIMES DAILY AS NEEDED. 540 tablet 1  . citalopram (CELEXA) 40 MG tablet Take 1 tablet (40 mg total) by mouth daily. 90 tablet 3  . ezetimibe (ZETIA) 10 MG tablet Take 1 tablet (10 mg total) by mouth daily. 90 tablet 3  . fluticasone (FLONASE) 50 MCG/ACT nasal spray Place 2 sprays into both nostrils daily. 16 g 11  . PROAIR HFA 108 (90 Base) MCG/ACT inhaler Inhale 2 puffs into the lungs every 4 (four) hours as needed for wheezing or shortness of breath. 1 each 2  . rosuvastatin (CRESTOR) 5 MG tablet Take 1 tablet (5 mg total) by mouth daily. 90 tablet 3   No facility-administered medications prior to  visit.      Review of Systems  Review of Systems   Physical Exam  There were no vitals taken for this visit. Physical Exam   Lab Results:  CBC    Component Value Date/Time   WBC 10.5 04/25/2020 1218   RBC 5.26 04/25/2020 1218   HGB 16.5 04/25/2020 1218   HCT 49.1 04/25/2020 1218   PLT 470 (H) 04/25/2020 1218   MCV 93.3 04/25/2020 1218   MCH 31.4 04/25/2020 1218   MCHC 33.6 04/25/2020 1218   RDW 12.1 04/25/2020 1218   LYMPHSABS 4,022 (H) 04/25/2020 1218   MONOABS 0.8 04/11/2019 1254   EOSABS 179 04/25/2020 1218   BASOSABS 147 04/25/2020 1218    BMET    Component Value Date/Time   NA 138 04/25/2020 1218   NA 138 09/06/2018 0000   K 4.9 04/25/2020 1218   CL 99 04/25/2020 1218   CO2 27 04/25/2020 1218   GLUCOSE 80 04/25/2020 1218   BUN 12 04/25/2020 1218   BUN 13 09/06/2018 0000   CREATININE 1.08 04/25/2020 1218   CALCIUM 10.6 (H) 04/25/2020 1218   CALCIUM 10.5 (H) 09/27/2018 1148   GFRNONAA 88 04/25/2020 1218   GFRAA 102 04/25/2020 1218    BNP No results found for: BNP  ProBNP No results found for: PROBNP  Imaging: No results found.   Assessment & Plan:   No problem-specific Assessment & Plan notes found for this encounter.  Martyn Ehrich, NP 06/04/2020

## 2020-06-05 ENCOUNTER — Institutional Professional Consult (permissible substitution): Payer: Self-pay | Admitting: Primary Care

## 2020-10-25 ENCOUNTER — Ambulatory Visit: Payer: Medicaid Other | Admitting: Nurse Practitioner

## 2020-10-25 DIAGNOSIS — F909 Attention-deficit hyperactivity disorder, unspecified type: Secondary | ICD-10-CM

## 2020-10-25 DIAGNOSIS — J4521 Mild intermittent asthma with (acute) exacerbation: Secondary | ICD-10-CM

## 2020-10-25 DIAGNOSIS — F419 Anxiety disorder, unspecified: Secondary | ICD-10-CM

## 2020-10-25 DIAGNOSIS — E781 Pure hyperglyceridemia: Secondary | ICD-10-CM

## 2020-10-25 DIAGNOSIS — R635 Abnormal weight gain: Secondary | ICD-10-CM

## 2020-10-25 DIAGNOSIS — E785 Hyperlipidemia, unspecified: Secondary | ICD-10-CM

## 2020-10-25 DIAGNOSIS — Z5181 Encounter for therapeutic drug level monitoring: Secondary | ICD-10-CM

## 2020-10-25 DIAGNOSIS — E669 Obesity, unspecified: Secondary | ICD-10-CM

## 2020-11-08 ENCOUNTER — Encounter: Payer: Self-pay | Admitting: Unknown Physician Specialty

## 2020-11-08 ENCOUNTER — Other Ambulatory Visit: Payer: Self-pay | Admitting: Unknown Physician Specialty

## 2020-11-08 ENCOUNTER — Ambulatory Visit: Payer: BC Managed Care – PPO | Admitting: Unknown Physician Specialty

## 2020-11-08 ENCOUNTER — Other Ambulatory Visit: Payer: Self-pay

## 2020-11-08 VITALS — BP 118/82 | HR 98 | Temp 98.3°F | Resp 18 | Ht 69.0 in | Wt 206.4 lb

## 2020-11-08 DIAGNOSIS — J310 Chronic rhinitis: Secondary | ICD-10-CM

## 2020-11-08 DIAGNOSIS — E781 Pure hyperglyceridemia: Secondary | ICD-10-CM | POA: Diagnosis not present

## 2020-11-08 DIAGNOSIS — F419 Anxiety disorder, unspecified: Secondary | ICD-10-CM | POA: Diagnosis not present

## 2020-11-08 DIAGNOSIS — E785 Hyperlipidemia, unspecified: Secondary | ICD-10-CM

## 2020-11-08 DIAGNOSIS — Z114 Encounter for screening for human immunodeficiency virus [HIV]: Secondary | ICD-10-CM

## 2020-11-08 DIAGNOSIS — J4521 Mild intermittent asthma with (acute) exacerbation: Secondary | ICD-10-CM

## 2020-11-08 DIAGNOSIS — Z6834 Body mass index (BMI) 34.0-34.9, adult: Secondary | ICD-10-CM | POA: Diagnosis not present

## 2020-11-08 DIAGNOSIS — E669 Obesity, unspecified: Secondary | ICD-10-CM | POA: Diagnosis not present

## 2020-11-08 DIAGNOSIS — F909 Attention-deficit hyperactivity disorder, unspecified type: Secondary | ICD-10-CM

## 2020-11-08 DIAGNOSIS — F5104 Psychophysiologic insomnia: Secondary | ICD-10-CM

## 2020-11-08 DIAGNOSIS — T466X5A Adverse effect of antihyperlipidemic and antiarteriosclerotic drugs, initial encounter: Secondary | ICD-10-CM

## 2020-11-08 DIAGNOSIS — M791 Myalgia, unspecified site: Secondary | ICD-10-CM

## 2020-11-08 DIAGNOSIS — D75839 Thrombocytosis, unspecified: Secondary | ICD-10-CM

## 2020-11-08 DIAGNOSIS — Z3009 Encounter for other general counseling and advice on contraception: Secondary | ICD-10-CM

## 2020-11-08 MED ORDER — PROAIR HFA 108 (90 BASE) MCG/ACT IN AERS: 2.0000 | INHALATION_SPRAY | RESPIRATORY_TRACT | 2 refills | Status: DC | PRN

## 2020-11-08 MED ORDER — FLUTICASONE PROPIONATE 50 MCG/ACT NA SUSP
2.0000 | Freq: Every day | NASAL | 11 refills | Status: DC
Start: 2020-11-08 — End: 2021-02-07

## 2020-11-08 MED ORDER — ROSUVASTATIN CALCIUM 5 MG PO TABS: 5.0000 mg | ORAL_TABLET | Freq: Every day | ORAL | 3 refills | Status: DC

## 2020-11-08 MED ORDER — CITALOPRAM HYDROBROMIDE 40 MG PO TABS: 40.0000 mg | ORAL_TABLET | Freq: Every day | ORAL | 3 refills | Status: DC

## 2020-11-08 MED ORDER — BUSPIRONE HCL 5 MG PO TABS: 5.0000 mg | ORAL_TABLET | Freq: Three times a day (TID) | ORAL | 1 refills | Status: DC | PRN

## 2020-11-08 MED ORDER — EZETIMIBE 10 MG PO TABS: 10.0000 mg | ORAL_TABLET | Freq: Every day | ORAL | 3 refills | Status: DC

## 2020-11-08 NOTE — Assessment & Plan Note (Signed)
Stable with Zoloft and Buspar.  However still needs improvement.  Discussed therapy, employee assistance, and counseling app resources given

## 2020-11-08 NOTE — Progress Notes (Signed)
BP 118/82   Pulse 98   Temp 98.3 F (36.8 C) (Oral)   Resp 18   Ht 5\' 9"  (1.753 m)   Wt 206 lb 6.4 oz (93.6 kg)   BMI 30.48 kg/m    Subjective:    Patient ID: Mario Boyd, male    DOB: 1985/08/12, 35 y.o.   MRN: [id number]  HPI: Mario Boyd is a 35 y.o. male  Chief Complaint  Patient presents with   Hyperlipidemia   Depression    6 month follow up   Hyperlipidemia Pt taking Crestor 5 mg plus Zetia.  No muscle pain but had it previously with Atorvastatin.  Diet and exercise not particularly healthy at this time.  Trying to make better choices and lost 19 pound weight loss in the last 6 months and not eating as much.    Anxiety Taking Celexa and Buspar and doing well.  Not as good of control as last visit.  Keeps putting off therapy.    Depression screen [facility] 2/9 11/08/2020 04/25/2020 01/25/2020 10/05/2019 08/04/2019  Decreased Interest 2 0 0 0 0  Down, Depressed, Hopeless 2 0 0 0 0  PHQ - 2 Score 4 0 0 0 0  Altered sleeping 1 - 0 3 3  Tired, decreased energy 1 - 0 2 3  Change in appetite 0 - 0 2 0  Feeling bad or failure about yourself  0 - 0 0 0  Trouble concentrating 0 - 0 2 3  Moving slowly or fidgety/restless 0 - 0 0 0  Suicidal thoughts 0 - 0 0 0  PHQ-9 Score 6 - 0 9 9  Difficult doing work/chores Not difficult at all - Not difficult at all Somewhat difficult Not difficult at all  Some recent data might be hidden       Relevant past medical, surgical, family and social history reviewed and updated as indicated. Interim medical history since our last visit reviewed. Allergies and medications reviewed and updated.  Review of Systems  Per HPI unless specifically indicated above     Objective:    BP 118/82   Pulse 98   Temp 98.3 F (36.8 C) (Oral)   Resp 18   Ht 5\' 9"  (1.753 m)   Wt 206 lb 6.4 oz (93.6 kg)   BMI 30.48 kg/m   Wt Readings from Last 3 Encounters:  11/08/20 206 lb 6.4 oz (93.6 kg)  04/25/20 235 lb 1.6 oz (106.6 kg)  01/25/20 220 lb  (99.8 kg)    Physical Exam Constitutional:      General: He is not in acute distress.    Appearance: Normal appearance. He is well-developed.  HENT:     Head: Normocephalic and atraumatic.  Eyes:     General: Lids are normal. No scleral icterus.       Right eye: No discharge.        Left eye: No discharge.     Conjunctiva/sclera: Conjunctivae normal.  Neck:     Vascular: No carotid bruit or JVD.  Cardiovascular:     Rate and Rhythm: Normal rate and regular rhythm.     Heart sounds: Normal heart sounds.  Pulmonary:     Effort: Pulmonary effort is normal. No respiratory distress.     Breath sounds: Normal breath sounds.  Abdominal:     Palpations: There is no hepatomegaly or splenomegaly.  Musculoskeletal:        General: Normal range of motion.     Cervical back: Normal range  of motion and neck supple.  Skin:    General: Skin is warm and dry.     Coloration: Skin is not pale.     Findings: No rash.  Neurological:     Mental Status: He is alert and oriented to person, place, and time.  Psychiatric:        Behavior: Behavior normal.        Thought Content: Thought content normal.        Judgment: Judgment normal.    Results for orders placed or performed in visit on 04/25/20  Lipid Panel w/reflex Direct LDL  Result Value Ref Range   Cholesterol 184 <200 mg/dL   HDL 52 > OR = 40 mg/dL   Triglycerides 392 (H) <150 mg/dL   LDL Cholesterol (Calc) 80 mg/dL (calc)   Total CHOL/HDL Ratio 3.5 <5.0 (calc)   Non-HDL Cholesterol (Calc) 132 (H) <130 mg/dL (calc)  CBC with Differential/Platelet  Result Value Ref Range   WBC 10.5 3.8 - 10.8 Thousand/uL   RBC 5.26 4.20 - 5.80 Million/uL   Hemoglobin 16.5 13.2 - 17.1 g/dL   HCT 49.1 38.5 - 50.0 %   MCV 93.3 80.0 - 100.0 fL   MCH 31.4 27.0 - 33.0 pg   MCHC 33.6 32.0 - 36.0 g/dL   RDW 12.1 11.0 - 15.0 %   Platelets 470 (H) 140 - 400 Thousand/uL   MPV 9.7 7.5 - 12.5 fL   Neutro Abs 5,313 1,500 - 7,800 cells/uL   Lymphs Abs 4,022  (H) 850 - 3,900 cells/uL   Absolute Monocytes 840 200 - 950 cells/uL   Eosinophils Absolute 179 15 - 500 cells/uL   Basophils Absolute 147 0 - 200 cells/uL   Neutrophils Relative % 50.6 %   Total Lymphocyte 38.3 %   Monocytes Relative 8.0 %   Eosinophils Relative 1.7 %   Basophils Relative 1.4 %  COMPLETE METABOLIC PANEL WITH GFR  Result Value Ref Range   Glucose, Bld 80 65 - 99 mg/dL   BUN 12 7 - 25 mg/dL   Creat 1.08 0.60 - 1.35 mg/dL   GFR, Est Non African American 88 > OR = 60 mL/min/1.67m2   GFR, Est African American 102 > OR = 60 mL/min/1.67m2   BUN/Creatinine Ratio NOT APPLICABLE 6 - 22 (calc)   Sodium 138 135 - 146 mmol/L   Potassium 4.9 3.5 - 5.3 mmol/L   Chloride 99 98 - 110 mmol/L   CO2 27 20 - 32 mmol/L   Calcium 10.6 (H) 8.6 - 10.3 mg/dL   Total Protein 7.7 6.1 - 8.1 g/dL   Albumin 4.7 3.6 - 5.1 g/dL   Globulin 3.0 1.9 - 3.7 g/dL (calc)   AG Ratio 1.6 1.0 - 2.5 (calc)   Total Bilirubin 0.6 0.2 - 1.2 mg/dL   Alkaline phosphatase (APISO) 93 36 - 130 U/L   AST 64 (H) 10 - 40 U/L   ALT 110 (H) 9 - 46 U/L  VITAMIN D 25 Hydroxy (Vit-D Deficiency, Fractures)  Result Value Ref Range   Vit D, 25-Hydroxy 24 (L) 30 - 100 ng/mL  TSH  Result Value Ref Range   TSH 3.15 0.40 - 4.50 mIU/L  Parathyroid hormone, intact (no Ca)  Result Value Ref Range   PTH 80 (H) 16 - 77 pg/mL      Assessment & Plan:   Problem List Items Addressed This Visit       Unprioritized   Anxiety disorder - Primary    Stable  with Zoloft and Buspar.  However still needs improvement.  Discussed therapy, employee assistance, and counseling app resources given      Relevant Medications   busPIRone (BUSPAR) 5 MG tablet   citalopram (CELEXA) 40 MG tablet   Other Relevant Orders   Lipid panel   Chronic rhinitis   Relevant Medications   fluticasone (FLONASE) 50 MCG/ACT nasal Boyd   Hyperlipidemia    Tolerating Crestor and Zetia.  Check labs today      Relevant Medications   ezetimibe  (ZETIA) 10 MG tablet   rosuvastatin (CRESTOR) 5 MG tablet   Other Relevant Orders   CBC with Differential/Platelet   TSH   Lipid panel   Hypertriglyceridemia   Relevant Medications   ezetimibe (ZETIA) 10 MG tablet   rosuvastatin (CRESTOR) 5 MG tablet   Other Relevant Orders   Lipid panel   Myalgia due to statin   Relevant Medications   ezetimibe (ZETIA) 10 MG tablet   Psychophysiological insomnia    Off and on.        Thrombocytosis    Check CBC today      Relevant Orders   CBC with Differential/Platelet   Other Visit Diagnoses     Hypercalcemia       Relevant Orders   CBC with Differential/Platelet   TSH   Class 1 obesity with body mass index (BMI) of 34.0 to 34.9 in adult, unspecified obesity type, unspecified whether serious comorbidity present       Relevant Orders   CBC with Differential/Platelet   TSH   Lipid panel   Attention deficit hyperactivity disorder (ADHD), unspecified ADHD type       Mild intermittent asthmatic bronchitis with acute exacerbation       cough, congestion, SOB, hx of asthma and pneumonia, doxy to cover lungs to atypical PNA, steroids and inhaler for asthma/bronchitis   Relevant Medications   PROAIR HFA 108 (90 Base) MCG/ACT inhaler   Screening for HIV without presence of risk factors       Relevant Orders   HIV Antibody (routine testing w rflx)   Vasectomy evaluation       Relevant Orders   Ambulatory referral to Urology       Wants a vasectomy   Follow up plan: Return in about 6 months (around 05/08/2021).

## 2020-11-08 NOTE — Assessment & Plan Note (Signed)
Off and on.

## 2020-11-08 NOTE — Patient Instructions (Signed)
Therapist apps:  Gretta Arab

## 2020-11-08 NOTE — Assessment & Plan Note (Signed)
Tolerating Crestor and Zetia.  Check labs today

## 2020-11-08 NOTE — Assessment & Plan Note (Signed)
Check CBC today.  

## 2020-11-09 LAB — CBC WITH DIFFERENTIAL/PLATELET
Basophils Absolute: 0.1 10*3/uL (ref 0.0–0.2)
Basos: 1 %
EOS (ABSOLUTE): 0.2 10*3/uL (ref 0.0–0.4)
Eos: 2 %
Hematocrit: 47.1 % (ref 37.5–51.0)
Hemoglobin: 16.2 g/dL (ref 13.0–17.7)
Immature Grans (Abs): 0 10*3/uL (ref 0.0–0.1)
Immature Granulocytes: 0 %
Lymphocytes Absolute: 3.3 10*3/uL — ABNORMAL HIGH (ref 0.7–3.1)
Lymphs: 34 %
MCH: 30.5 pg (ref 26.6–33.0)
MCHC: 34.4 g/dL (ref 31.5–35.7)
MCV: 89 fL (ref 79–97)
Monocytes Absolute: 0.8 10*3/uL (ref 0.1–0.9)
Monocytes: 8 %
Neutrophils Absolute: 5.4 10*3/uL (ref 1.4–7.0)
Neutrophils: 55 %
Platelets: 481 10*3/uL — ABNORMAL HIGH (ref 150–450)
RBC: 5.32 x10E6/uL (ref 4.14–5.80)
RDW: 12.9 % (ref 11.6–15.4)
WBC: 9.8 10*3/uL (ref 3.4–10.8)

## 2020-11-09 LAB — HIV ANTIBODY (ROUTINE TESTING W REFLEX): HIV Screen 4th Generation wRfx: NONREACTIVE

## 2020-11-09 LAB — LIPID PANEL
Chol/HDL Ratio: 4 ratio (ref 0.0–5.0)
Cholesterol, Total: 167 mg/dL (ref 100–199)
HDL: 42 mg/dL (ref >39)
LDL Chol Calc (NIH): 97 mg/dL (ref 0–99)
Triglycerides: 162 mg/dL — ABNORMAL HIGH (ref 0–149)
VLDL Cholesterol Cal: 28 mg/dL (ref 5–40)

## 2020-11-09 LAB — TSH: TSH: 0.822 u[IU]/mL (ref 0.450–4.500)

## 2020-11-09 NOTE — Telephone Encounter (Signed)
Requested medication (s) are due for refill today:   Yes prescribed yesterday by Kathrine Haddock  Requested medication (s) are on the active medication list:   Yes but an alternative is being requested  Future visit scheduled:   Yes with Lucio Edward   Last ordered: 11/08/2020 1 each, 2 refills  Returned because Proair has been discontinued requesting to use Ventolin instead per pharmacy.   Requested Prescriptions  Pending Prescriptions Disp Refills   PROAIR HFA 108 (90 Base) MCG/ACT inhaler [Pharmacy Med Name: PROAIR HFA 90 MCG INHALER] 8.5 each 2    Sig: INHALE 2 PUFFS INTO THE LUNGS EVERY 4 HOURS AS NEEDED FOR WHEEZING OR SHORTNESS OF BREATH.     Pulmonology:  Beta Agonists Failed - 11/08/2020  2:54 PM      Failed - One inhaler should last at least one month. If the patient is requesting refills earlier, contact the patient to check for uncontrolled symptoms.      Passed - Valid encounter within last 12 months    Recent Outpatient Visits           Yesterday Anxiety disorder, unspecified type   [place], NP   6 months ago Anxiety disorder, unspecified type   [facility] Delsa Grana, PA-C   9 months ago Symptoms of upper respiratory infection (URI)   [facility] Delsa Grana, PA-C   1 year ago Hypertriglyceridemia   [facility] Delsa Grana, PA-C   1 year ago Anxiety disorder, unspecified type   [place], MD       Future Appointments             In 6 months Delsa Grana, PA-C [facility], [facility]

## 2020-11-13 ENCOUNTER — Encounter: Payer: Self-pay | Admitting: *Deleted

## 2020-11-15 ENCOUNTER — Encounter: Payer: Self-pay | Admitting: Unknown Physician Specialty

## 2021-01-10 ENCOUNTER — Other Ambulatory Visit: Payer: Self-pay | Admitting: Family Medicine

## 2021-02-07 ENCOUNTER — Other Ambulatory Visit: Payer: Self-pay

## 2021-02-07 ENCOUNTER — Encounter: Payer: Self-pay | Admitting: Nurse Practitioner

## 2021-02-07 ENCOUNTER — Ambulatory Visit (INDEPENDENT_AMBULATORY_CARE_PROVIDER_SITE_OTHER): Payer: BC Managed Care – PPO | Admitting: Nurse Practitioner

## 2021-02-07 VITALS — BP 124/82 | HR 92 | Temp 98.1°F | Resp 18 | Ht 69.0 in | Wt 202.4 lb

## 2021-02-07 DIAGNOSIS — S7001XA Contusion of right hip, initial encounter: Secondary | ICD-10-CM | POA: Diagnosis not present

## 2021-02-07 DIAGNOSIS — M25551 Pain in right hip: Secondary | ICD-10-CM | POA: Diagnosis not present

## 2021-02-07 DIAGNOSIS — S76011A Strain of muscle, fascia and tendon of right hip, initial encounter: Secondary | ICD-10-CM | POA: Diagnosis not present

## 2021-02-07 DIAGNOSIS — L989 Disorder of the skin and subcutaneous tissue, unspecified: Secondary | ICD-10-CM

## 2021-02-07 DIAGNOSIS — L732 Hidradenitis suppurativa: Secondary | ICD-10-CM

## 2021-02-07 MED ORDER — METHOCARBAMOL 500 MG PO TABS: 500.0000 mg | ORAL_TABLET | Freq: Two times a day (BID) | ORAL | 0 refills | Status: DC | PRN

## 2021-02-07 MED ORDER — DOXYCYCLINE HYCLATE 100 MG PO TABS: 100.0000 mg | ORAL_TABLET | Freq: Two times a day (BID) | ORAL | 0 refills | Status: AC

## 2021-02-07 MED ORDER — PREDNISONE 10 MG (21) PO TBPK: ORAL_TABLET | ORAL | 0 refills | Status: DC

## 2021-02-07 NOTE — Progress Notes (Signed)
BP 124/82    Pulse 92    Temp 98.1 F (36.7 C) (Oral)    Resp 18    Ht 5\' 9"  (1.753 m)    Wt 202 lb 6.4 oz (91.8 kg)    SpO2 99%    BMI 29.89 kg/m    Subjective:    Patient ID: Mario Boyd, male    DOB: 1985/03/29, 36 y.o.   MRN: [id number]  HPI: Mario Boyd is a 35 y.o. male, here alone  Chief Complaint  Patient presents with   Cyst    On scalp/head   Hip Pain    Right hip pain   Hidradenitis suppurativa:  He says that he can tell his is flaring up. He says his last flare up it got really bad in 2020.  His is on his scalp. He says he used to be on humira but could not tolerate it.  His scalp does look inflamed and there are multiple abscesses. Discussed treatment with a course of steroids, antibiotics and referral placed to dermatology.  Skin lesions:  He says he has multiple skin tags and would like the removed. He says since he is going to go to dermatology he would like them to remove them. Referral was placed to dermatology.  Hip pain:  He says he has had right hip pain that radiates down his right leg off and on for a month. He says he was playing racket ball and thinks that is when it started hurting.  He says he has been taking tylenol and ibuprofen for the pain, which helps. Discussed that the steroid pack he is going to be on for his Hidradenitis suppurativa could help with the inflammation.  Discussed continuing tylenol and ibuprofen for pain.  Discussed adding heat therapy.  He also says he has been feeling a cramping feeling. Discussed doing a muscle relaxer to see if that helps. If no improvement he is to follow-up.  Relevant past medical, surgical, family and social history reviewed and updated as indicated. Interim medical history since our last visit reviewed. Allergies and medications reviewed and updated.  Review of Systems  Constitutional: Negative for fever or weight change.  Respiratory: Negative for cough and shortness of breath.   Cardiovascular: Negative  for chest pain or palpitations.  Gastrointestinal: Negative for abdominal pain, no bowel changes.  Musculoskeletal: Negative for gait problem or joint swelling. Positive for right hip and leg pain. Skin: Positive for multiple abscess on scalp Neurological: Negative for dizziness or headache.  No other specific complaints in a complete review of systems (except as listed in HPI above).      Objective:    BP 124/82    Pulse 92    Temp 98.1 F (36.7 C) (Oral)    Resp 18    Ht 5\' 9"  (1.753 m)    Wt 202 lb 6.4 oz (91.8 kg)    SpO2 99%    BMI 29.89 kg/m   Wt Readings from Last 3 Encounters:  02/07/21 202 lb 6.4 oz (91.8 kg)  11/08/20 206 lb 6.4 oz (93.6 kg)  04/25/20 235 lb 1.6 oz (106.6 kg)    Physical Exam  Constitutional: Patient appears well-developed and well-nourished. No distress.  HEENT: head atraumatic, normocephalic, pupils equal and reactive to light, neck supple Cardiovascular: Normal rate, regular rhythm and normal heart sounds.  No murmur heard. No BLE edema. Pulmonary/Chest: Effort normal and breath sounds normal. No respiratory distress. Abdominal: Soft.  There is no tenderness. Skin: multiple  abscess on scalp Musculoskeletal: no decrease ROM, good PMS Psychiatric: Patient has a normal mood and affect. behavior is normal. Judgment and thought content normal.   Results for orders placed or performed in visit on 11/08/20  CBC with Differential/Platelet  Result Value Ref Range   WBC 9.8 3.4 - 10.8 x10E3/uL   RBC 5.32 4.14 - 5.80 x10E6/uL   Hemoglobin 16.2 13.0 - 17.7 g/dL   Hematocrit 47.1 37.5 - 51.0 %   MCV 89 79 - 97 fL   MCH 30.5 26.6 - 33.0 pg   MCHC 34.4 31.5 - 35.7 g/dL   RDW 12.9 11.6 - 15.4 %   Platelets 481 (H) 150 - 450 x10E3/uL   Neutrophils 55 Not Estab. %   Lymphs 34 Not Estab. %   Monocytes 8 Not Estab. %   Eos 2 Not Estab. %   Basos 1 Not Estab. %   Neutrophils Absolute 5.4 1.4 - 7.0 x10E3/uL   Lymphocytes Absolute 3.3 (H) 0.7 - 3.1 x10E3/uL    Monocytes Absolute 0.8 0.1 - 0.9 x10E3/uL   EOS (ABSOLUTE) 0.2 0.0 - 0.4 x10E3/uL   Basophils Absolute 0.1 0.0 - 0.2 x10E3/uL   Immature Granulocytes 0 Not Estab. %   Immature Grans (Abs) 0.0 0.0 - 0.1 x10E3/uL  Lipid panel  Result Value Ref Range   Cholesterol, Total 167 100 - 199 mg/dL   Triglycerides 162 (H) 0 - 149 mg/dL   HDL 42 >39 mg/dL   VLDL Cholesterol Cal 28 5 - 40 mg/dL   LDL Chol Calc (NIH) 97 0 - 99 mg/dL   Chol/HDL Ratio 4.0 0.0 - 5.0 ratio  TSH  Result Value Ref Range   TSH 0.822 0.450 - 4.500 uIU/mL  HIV Antibody (routine testing w rflx)  Result Value Ref Range   HIV Screen 4th Generation wRfx Non Reactive Non Reactive      Assessment & Plan:   1. Hidradenitis suppurativa  - Ambulatory referral to Dermatology - predniSONE (STERAPRED UNI-PAK 21 TAB) 10 MG (21) TBPK tablet; Take as directed on package.  (60 mg po on day 1, 50 mg po on day 2...)  Dispense: 21 tablet; Refill: 0 - doxycycline (VIBRA-TABS) 100 MG tablet; Take 1 tablet (100 mg total) by mouth 2 (two) times daily for 10 days.  Dispense: 20 tablet; Refill: 0  2. Right hip pain  - predniSONE (STERAPRED UNI-PAK 21 TAB) 10 MG (21) TBPK tablet; Take as directed on package.  (60 mg po on day 1, 50 mg po on day 2...)  Dispense: 21 tablet; Refill: 0 - methocarbamol (ROBAXIN) 500 MG tablet; Take 1 tablet (500 mg total) by mouth 2 (two) times daily as needed for muscle spasms.  Dispense: 20 tablet; Refill: 0  3. Generalized skin lesions  - Ambulatory referral to Dermatology   Follow up plan: Return if symptoms worsen or fail to improve.

## 2021-05-09 ENCOUNTER — Encounter: Payer: Self-pay | Admitting: Family Medicine

## 2021-05-09 ENCOUNTER — Ambulatory Visit (INDEPENDENT_AMBULATORY_CARE_PROVIDER_SITE_OTHER): Payer: BC Managed Care – PPO | Admitting: Family Medicine

## 2021-05-09 VITALS — BP 118/84 | HR 93 | Resp 16 | Ht 69.0 in | Wt 184.0 lb

## 2021-05-09 DIAGNOSIS — Z79899 Other long term (current) drug therapy: Secondary | ICD-10-CM

## 2021-05-09 DIAGNOSIS — E785 Hyperlipidemia, unspecified: Secondary | ICD-10-CM | POA: Diagnosis not present

## 2021-05-09 DIAGNOSIS — Z5181 Encounter for therapeutic drug level monitoring: Secondary | ICD-10-CM

## 2021-05-09 DIAGNOSIS — D75839 Thrombocytosis, unspecified: Secondary | ICD-10-CM

## 2021-05-09 DIAGNOSIS — D84821 Immunodeficiency due to drugs: Secondary | ICD-10-CM

## 2021-05-09 DIAGNOSIS — E875 Hyperkalemia: Secondary | ICD-10-CM

## 2021-05-09 DIAGNOSIS — L732 Hidradenitis suppurativa: Secondary | ICD-10-CM

## 2021-05-09 DIAGNOSIS — R7989 Other specified abnormal findings of blood chemistry: Secondary | ICD-10-CM

## 2021-05-09 DIAGNOSIS — E781 Pure hyperglyceridemia: Secondary | ICD-10-CM | POA: Diagnosis not present

## 2021-05-09 DIAGNOSIS — R634 Abnormal weight loss: Secondary | ICD-10-CM

## 2021-05-09 DIAGNOSIS — M25551 Pain in right hip: Secondary | ICD-10-CM

## 2021-05-09 DIAGNOSIS — J310 Chronic rhinitis: Secondary | ICD-10-CM

## 2021-05-09 DIAGNOSIS — F419 Anxiety disorder, unspecified: Secondary | ICD-10-CM

## 2021-05-09 MED ORDER — DOXYCYCLINE HYCLATE 100 MG PO TABS: 100.0000 mg | ORAL_TABLET | Freq: Two times a day (BID) | ORAL | 0 refills | Status: AC

## 2021-05-09 MED ORDER — BUSPIRONE HCL 10 MG PO TABS: 10.0000 mg | ORAL_TABLET | Freq: Three times a day (TID) | ORAL | 3 refills | Status: DC | PRN

## 2021-05-09 NOTE — Progress Notes (Signed)
? ?Name: Mario Boyd   MRN: [id number]    DOB: April 09, 1985   Date:05/09/2021 ? ?     Progress Note ? ?Chief Complaint  ?Patient presents with  ? Follow-up  ? ? ? ?Subjective:  ? ?Mario Boyd is a 36 y.o. male, presents to clinic for routine f/up ? ?Anxiety -still doing well his symptoms are much better controlled than prior to starting medications ?On celexa 40  and buspar as needed but he states no medicines were refilled last time ? ?  02/07/2021  ?  9:38 AM 11/08/2020  ?  2:22 PM 04/25/2020  ? 11:49 AM 10/05/2019  ?  9:42 AM  ?GAD 7 : Generalized Anxiety Score  ?Nervous, Anxious, on Edge 0 1 0 2  ?Control/stop worrying 0 0 0 1  ?Worry too much - different things 0 0 0 2  ?Trouble relaxing 0 0 0 2  ?Restless 0 1 1 0  ?Easily annoyed or irritable 0 0 0 0  ?Afraid - awful might happen 0 0 0 0  ?Total GAD 7 Score 0 '2 1 7  '$ ?Anxiety Difficulty Not difficult at all Not difficult at all Not difficult at all   ? ? ?  05/09/2021  ?  2:55 PM 02/07/2021  ?  9:38 AM 11/08/2020  ?  2:20 PM  ?Depression screen PHQ 2/9  ?Decreased Interest 2 0 2  ?Down, Depressed, Hopeless 0 0 2  ?PHQ - 2 Score 2 0 4  ?Altered sleeping 3 0 1  ?Tired, decreased energy 3 0 1  ?Change in appetite 3 0 0  ?Feeling bad or failure about yourself  0 0 0  ?Trouble concentrating 3 0 0  ?Moving slowly or fidgety/restless 0 0 0  ?Suicidal thoughts 0 0 0  ?PHQ-9 Score 14 0 6  ?Difficult doing work/chores  Not difficult at all Not difficult at all  ?PHQ-9 is positive today he endorses feeling very worn out and tired this week but overall his mood is good he is experiencing a lot of stress at his job but his overall anxiety symptoms are much better than he used to be ? ?Hyperlipidemia: ?Currently treated with zetia and crestor 5, pt reports good med compliance ?Last Lipids: ?Lab Results  ?Component Value Date  ? CHOL 167 11/08/2020  ? HDL 42 11/08/2020  ? Mayer 97 11/08/2020  ? TRIG 162 (H) 11/08/2020  ? CHOLHDL 4.0 11/08/2020  ? ?- Denies: Chest pain, shortness of  breath, myalgias, claudication ? ?Also has hx of chronic thrombocytosis, hypercalcemia - in family hx as well ? ?Right hip pain - saw [facility] - did eval and PT -he did not continue treatment with them but continues to have pain his visit was less than 2 months ago ? ?Scalp- dissecting cellulitis some flare up -he has been off of his Humira for some time he has noted significant improvement in his chronic rhinitis and recurrent sinusitis he feels much better he would like an antibiotic or something to help with his flare he does have follow-up with dermatology in a month or 2 ? ? ?Unintentional significant weight loss roughly 50 pounds in the past year ?Wt Readings from Last 10 Encounters:  ?05/09/21 184 lb (83.5 kg)  ?02/07/21 202 lb 6.4 oz (91.8 kg)  ?11/08/20 206 lb 6.4 oz (93.6 kg)  ?04/25/20 235 lb 1.6 oz (106.6 kg)  ?01/25/20 220 lb (99.8 kg)  ?10/05/19 222 lb 4.8 oz (100.8 kg)  ?08/04/19 (!) 218 lb 11.2 oz (  99.2 kg)  ?06/21/19 222 lb 1.6 oz (100.7 kg)  ?05/24/19 222 lb 8 oz (100.9 kg)  ?01/24/19 224 lb 1.6 oz (101.7 kg)  ? ?BMI Readings from Last 5 Encounters:  ?05/09/21 27.17 kg/m?  ?02/07/21 29.89 kg/m?  ?11/08/20 30.48 kg/m?  ?04/25/20 34.72 kg/m?  ?01/25/20 32.49 kg/m?  ? ?His TSH was checked at the last office appointment by Malachy Mood NP -patient used to have severe anxiety per few sweats, tachycardia and he currently has unintentional weight loss we will recheck TSH ? ? ?Current Outpatient Medications:  ?  busPIRone (BUSPAR) 5 MG tablet, Take 1-2 tablets (5-10 mg total) by mouth 3 (three) times daily as needed., Disp: 540 tablet, Rfl: 1 ?  citalopram (CELEXA) 40 MG tablet, Take 1 tablet (40 mg total) by mouth daily., Disp: 90 tablet, Rfl: 3 ?  ezetimibe (ZETIA) 10 MG tablet, Take 1 tablet (10 mg total) by mouth daily., Disp: 90 tablet, Rfl: 3 ?  methocarbamol (ROBAXIN) 500 MG tablet, Take 1 tablet (500 mg total) by mouth 2 (two) times daily as needed for muscle spasms., Disp: 20 tablet, Rfl: 0 ?   rosuvastatin (CRESTOR) 5 MG tablet, Take 1 tablet (5 mg total) by mouth daily., Disp: 90 tablet, Rfl: 3 ? ?Patient Active Problem List  ? Diagnosis Date Noted  ? Abnormal transaminases 08/04/2019  ? Psychophysiological insomnia 08/04/2019  ? Anxiety disorder 08/04/2019  ? Hyperlipidemia 05/24/2019  ? Myalgia due to statin 05/24/2019  ? Immunocompromised state due to drug therapy ([place]) 01/24/2019  ? Cervical lymphadenopathy 01/24/2019  ? Chronic rhinitis 01/24/2019  ? Class 1 obesity due to excess calories with serious comorbidity and body mass index (BMI) of 32.0 to 32.9 in adult 09/17/2018  ? Elevated LFTs 09/17/2018  ? Hypertriglyceridemia 09/17/2018  ? History of nephrectomy, right 09/17/2018  ? Hidradenitis suppurativa 09/17/2018  ? Thrombocytosis 09/17/2018  ? ? ?Past Surgical History:  ?Procedure Laterality Date  ? HERNIA REPAIR    ? KIDNEY SURGERY    ? NEPHRECTOMY LIVING DONOR Right 2009  ? TONSILLECTOMY    ? TYMPANOSTOMY TUBE PLACEMENT    ? ? ?Family History  ?Problem Relation Age of Onset  ? Hyperlipidemia Mother   ? Hypertension Mother   ? Other Mother   ?     thrombocytosis  ? Hyperlipidemia Father   ? Hypertension Father   ? Epilepsy Sister   ? Hypertension Sister   ? Epilepsy Son   ? Heart failure Maternal Grandmother   ? Diabetes Maternal Grandfather   ? Heart failure Maternal Grandfather   ? Other Maternal Grandfather   ?     thrombocytosis  ? Diabetes Paternal Grandmother   ? Hypertension Paternal Grandfather   ? Prostate cancer Paternal Grandfather   ? ? ?Social History  ? ?Tobacco Use  ? Smoking status: Former  ?  Packs/day: 0.50  ?  Years: 15.00  ?  Pack years: 7.50  ?  Types: Cigarettes  ? Smokeless tobacco: Never  ?Vaping Use  ? Vaping Use: Every day  ? Substances: Nicotine, Flavoring  ?Substance Use Topics  ? Alcohol use: Never  ? Drug use: Never  ?  ? ?No Known Allergies ? ?Health Maintenance  ?Topic Date Due  ? COVID-19 Vaccine (3 - Mixed Product risk series) 05/24/2019  ? INFLUENZA VACCINE   08/06/2021  ? TETANUS/TDAP  12/16/2028  ? Hepatitis C Screening  Completed  ? HIV Screening  Completed  ? HPV VACCINES  Aged Out  ? ? ?Chart Review  Today: ?I personally reviewed active problem list, medication list, allergies, family history, social history, health maintenance, notes from last encounter, lab results, imaging with the patient/caregiver today. ? ? ?Review of Systems  ?Constitutional: Negative.   ?HENT: Negative.    ?Eyes: Negative.   ?Respiratory: Negative.    ?Cardiovascular: Negative.   ?Gastrointestinal: Negative.   ?Endocrine: Negative.   ?Genitourinary: Negative.   ?Musculoskeletal: Negative.   ?Skin: Negative.   ?Allergic/Immunologic: Negative.   ?Neurological: Negative.   ?Hematological: Negative.   ?Psychiatric/Behavioral: Negative.    ?All other systems reviewed and are negative.  ? ?Objective:  ? ?Vitals:  ? 05/09/21 1456  ?BP: 118/84  ?Pulse: 93  ?Resp: 16  ?SpO2: 99%  ?Weight: 184 lb (83.5 kg)  ?Height: '5\' 9"'$  (1.753 m)  ? ? ?Body mass index is 27.17 kg/m?. ? ?Physical Exam ?Vitals and nursing note reviewed.  ?Constitutional:   ?   General: He is not in acute distress. ?   Appearance: Normal appearance. He is well-developed and normal weight. He is not ill-appearing, toxic-appearing or diaphoretic.  ?   Interventions: Face mask in place.  ?HENT:  ?   Head: Normocephalic and atraumatic.  ?   Jaw: No trismus.  ?   Right Ear: External ear normal.  ?   Left Ear: External ear normal.  ?   Nose: Nose normal.  ?   Mouth/Throat:  ?   Mouth: Mucous membranes are moist.  ?   Pharynx: Oropharynx is clear.  ?Eyes:  ?   General: Lids are normal. No scleral icterus.    ?   Right eye: No discharge.     ?   Left eye: No discharge.  ?   Conjunctiva/sclera: Conjunctivae normal.  ?Neck:  ?   Thyroid: No thyroid mass, thyromegaly or thyroid tenderness.  ?   Trachea: Trachea and phonation normal. No tracheal deviation.  ?Cardiovascular:  ?   Rate and Rhythm: Normal rate and regular rhythm.  ?   Pulses: Normal  pulses.     ?     Radial pulses are 2+ on the right side and 2+ on the left side.  ?     Posterior tibial pulses are 2+ on the right side and 2+ on the left side.  ?   Heart sounds: Normal heart sounds. No m

## 2021-05-13 DIAGNOSIS — R634 Abnormal weight loss: Secondary | ICD-10-CM | POA: Diagnosis not present

## 2021-05-13 DIAGNOSIS — Z5181 Encounter for therapeutic drug level monitoring: Secondary | ICD-10-CM | POA: Diagnosis not present

## 2021-05-13 DIAGNOSIS — L732 Hidradenitis suppurativa: Secondary | ICD-10-CM | POA: Diagnosis not present

## 2021-05-13 DIAGNOSIS — Z79899 Other long term (current) drug therapy: Secondary | ICD-10-CM | POA: Diagnosis not present

## 2021-05-13 DIAGNOSIS — E785 Hyperlipidemia, unspecified: Secondary | ICD-10-CM | POA: Diagnosis not present

## 2021-05-13 DIAGNOSIS — D84821 Immunodeficiency due to drugs: Secondary | ICD-10-CM | POA: Diagnosis not present

## 2021-05-15 LAB — CBC WITH DIFFERENTIAL/PLATELET
Basophils Absolute: 0.1 10*3/uL (ref 0.0–0.2)
Basos: 1 %
EOS (ABSOLUTE): 0.2 10*3/uL (ref 0.0–0.4)
Eos: 2 %
Hematocrit: 44.8 % (ref 37.5–51.0)
Hemoglobin: 15.5 g/dL (ref 13.0–17.7)
Immature Grans (Abs): 0 10*3/uL (ref 0.0–0.1)
Immature Granulocytes: 0 %
Lymphocytes Absolute: 3.4 10*3/uL — ABNORMAL HIGH (ref 0.7–3.1)
Lymphs: 37 %
MCH: 30.7 pg (ref 26.6–33.0)
MCHC: 34.6 g/dL (ref 31.5–35.7)
MCV: 89 fL (ref 79–97)
Monocytes Absolute: 0.6 10*3/uL (ref 0.1–0.9)
Monocytes: 7 %
Neutrophils Absolute: 4.8 10*3/uL (ref 1.4–7.0)
Neutrophils: 53 %
Platelets: 478 10*3/uL — ABNORMAL HIGH (ref 150–450)
RBC: 5.05 x10E6/uL (ref 4.14–5.80)
RDW: 12.3 % (ref 11.6–15.4)
WBC: 9.1 10*3/uL (ref 3.4–10.8)

## 2021-05-15 LAB — COMPREHENSIVE METABOLIC PANEL
ALT: 33 IU/L (ref 0–44)
AST: 25 IU/L (ref 0–40)
Albumin/Globulin Ratio: 1.8 (ref 1.2–2.2)
Albumin: 4.7 g/dL (ref 4.0–5.0)
Alkaline Phosphatase: 76 IU/L (ref 44–121)
BUN/Creatinine Ratio: 12 (ref 9–20)
BUN: 13 mg/dL (ref 6–20)
Bilirubin Total: 0.3 mg/dL (ref 0.0–1.2)
CO2: 24 mmol/L (ref 20–29)
Calcium: 10.4 mg/dL — ABNORMAL HIGH (ref 8.7–10.2)
Chloride: 105 mmol/L (ref 96–106)
Creatinine, Ser: 1.07 mg/dL (ref 0.76–1.27)
Globulin, Total: 2.6 g/dL (ref 1.5–4.5)
Glucose: 86 mg/dL (ref 70–99)
Potassium: 5.4 mmol/L — ABNORMAL HIGH (ref 3.5–5.2)
Sodium: 139 mmol/L (ref 134–144)
Total Protein: 7.3 g/dL (ref 6.0–8.5)
eGFR: 92 mL/min/{1.73_m2} (ref >59)

## 2021-05-15 LAB — PARATHYROID HORMONE, INTACT (NO CA): PTH: 38 pg/mL (ref 15–65)

## 2021-05-15 LAB — T4, FREE: Free T4: 1.17 ng/dL (ref 0.82–1.77)

## 2021-05-15 LAB — TSH: TSH: 1.57 u[IU]/mL (ref 0.450–4.500)

## 2021-05-15 NOTE — Addendum Note (Signed)
Addended by: Delsa Grana on: 05/15/2021 04:45 PM ? ? Modules accepted: Orders ? ?

## 2021-05-29 IMAGING — CR DG CHEST 2V
1 series · 2 of 2 positions shown · non-contrast
Comparison: None.

CLINICAL DATA: 33-year-old male with a history of leukocytosis

EXAM:
CHEST - 2 VIEW

[Series 1: dg chest 2 view · 0.14mm/px · 2 of 2 slices shown]
[im 1/2]
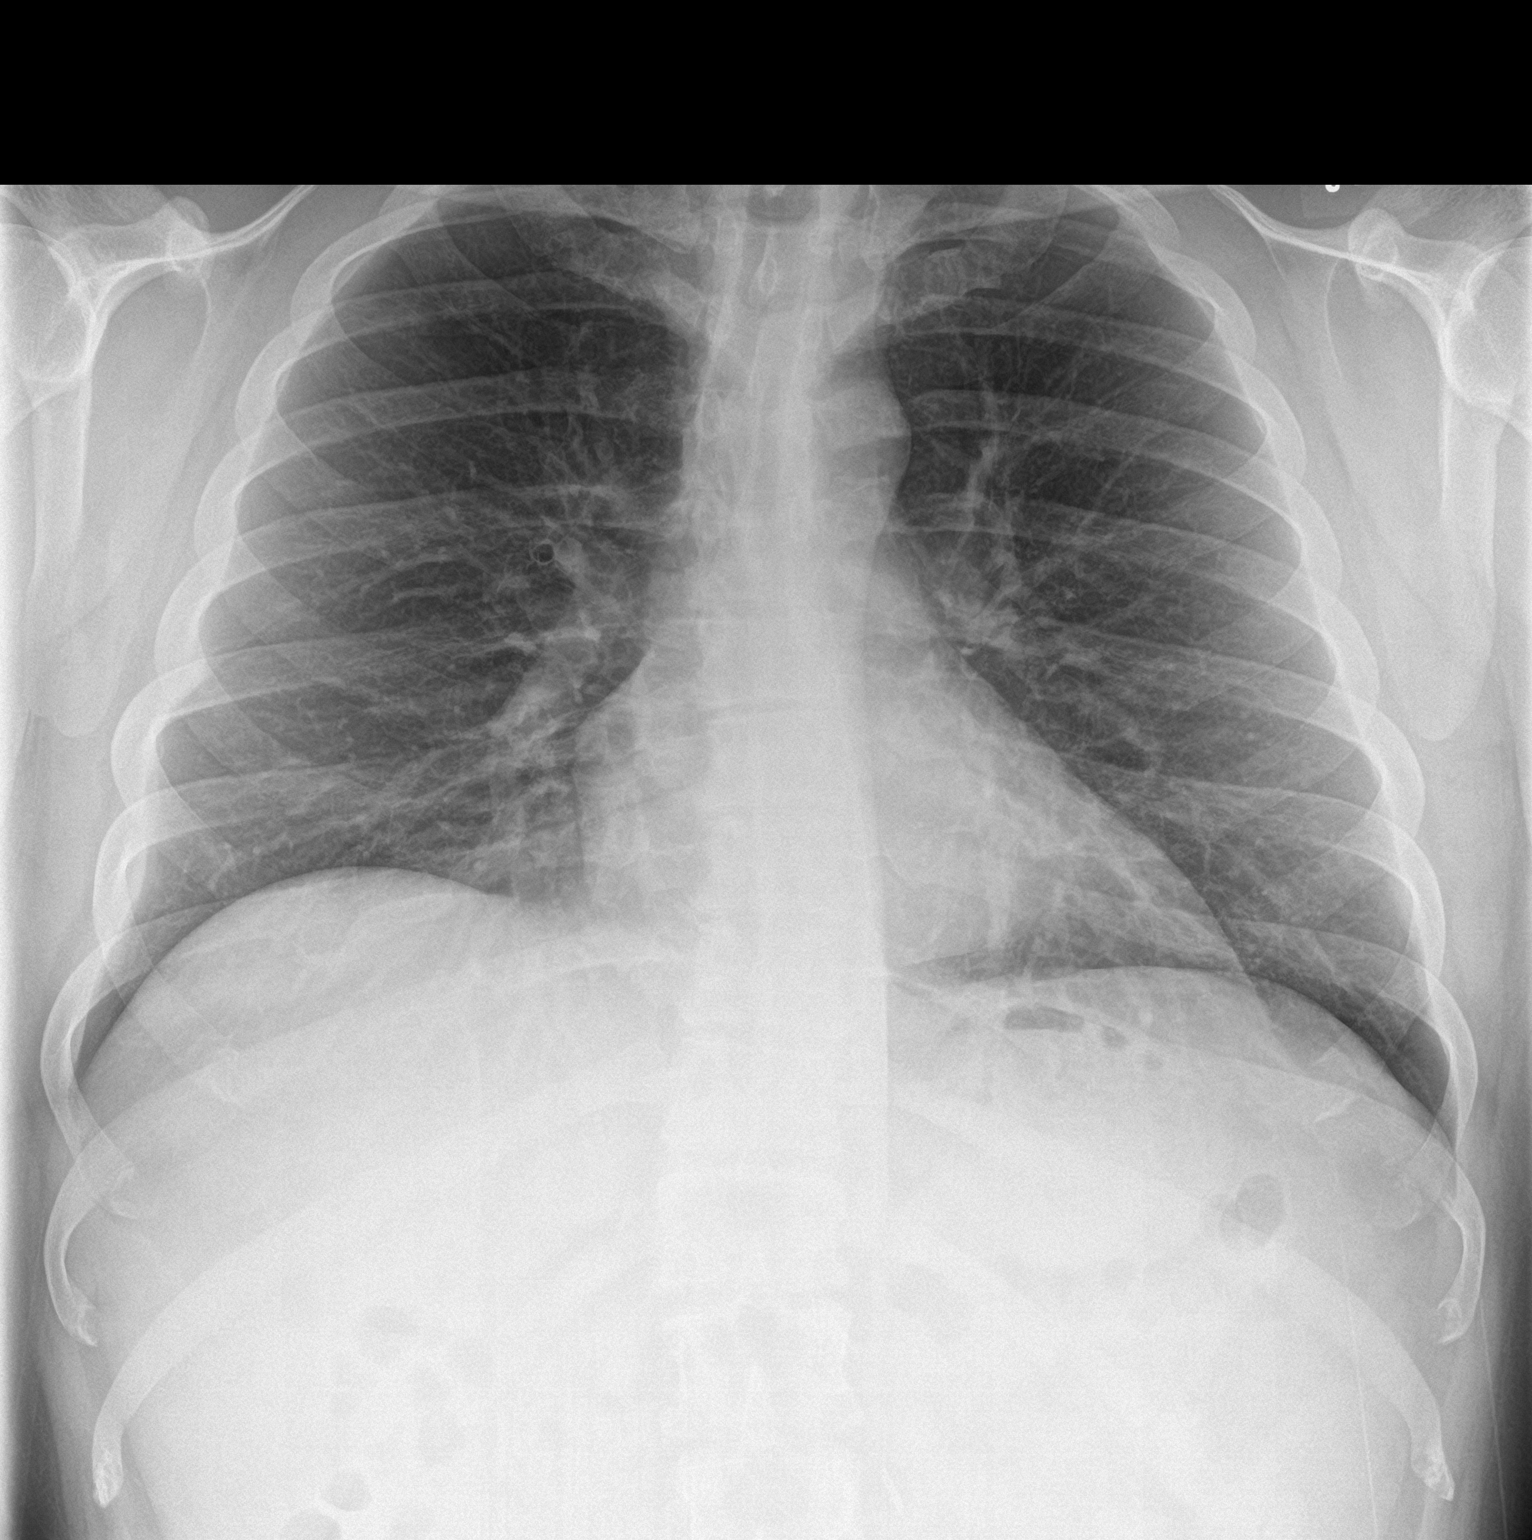
[im 2/2]
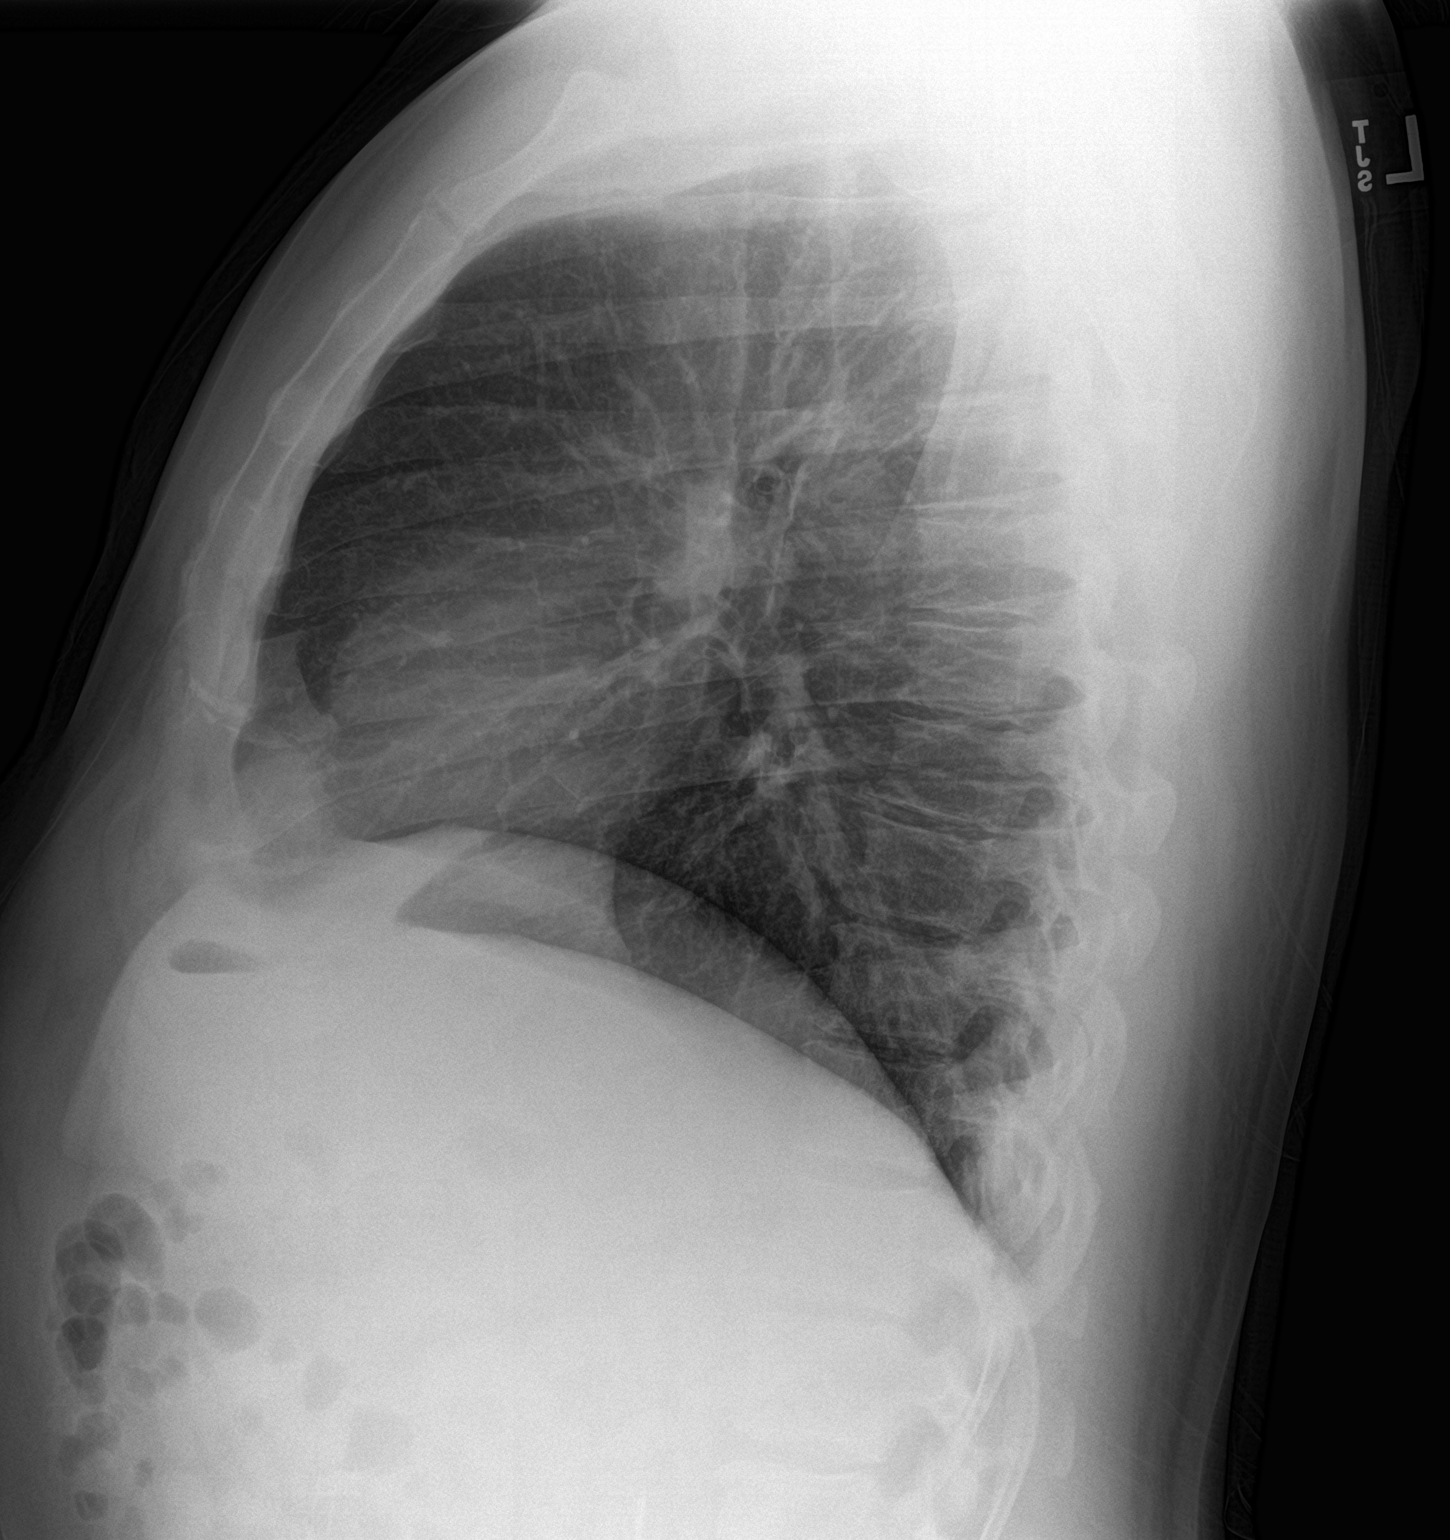

[2 of 2 positions shown; findings below may reference images not displayed]

FINDINGS: The heart size and mediastinal contours are within normal limits.
Both lungs are clear. The visualized skeletal structures are
unremarkable.
IMPRESSION: Negative for acute cardiopulmonary disease

## 2021-07-11 ENCOUNTER — Ambulatory Visit: Payer: Medicaid Other | Admitting: Family Medicine

## 2021-07-11 VITALS — BP 104/62 | HR 77 | Temp 98.3°F | Resp 16 | Ht 69.0 in | Wt 178.1 lb

## 2021-07-11 DIAGNOSIS — R634 Abnormal weight loss: Secondary | ICD-10-CM | POA: Diagnosis not present

## 2021-07-11 NOTE — Progress Notes (Signed)
Patient ID: Mario Boyd, male    DOB: Aug 10, 1985, 36 y.o.   MRN: [id number]  PCP: Delsa Grana, PA-C  Chief Complaint  Patient presents with   Follow-up   Weight Loss    Subjective:   Mario Boyd is a 36 y.o. male, presents to clinic with CC of the following:  HPI  Patient presents for unintentional weight loss he previously weighed almost 240 pounds and at last office visit was down to 180s he has lost a few more pounds went from 184-178 despite not working as much and trying to eat more food He denies any increase in sweats, denies palpitations, anxiety, diarrhea, lymphadenopathy, anything painful, new lumps or bumps, shortness of breath or hemoptysis He has not tried to count his calories and overall had been trying to eat healthier.  He took a trip back home to [place] and ate a lot of junk food this flared up his HS  - so he is still trying to avoid unhealthy foods in general.  He is not working out/exercising Wt Readings from Last 5 Encounters:  07/11/21 178 lb 1.6 oz (80.8 kg)  05/09/21 184 lb (83.5 kg)  02/07/21 202 lb 6.4 oz (91.8 kg)  11/08/20 206 lb 6.4 oz (93.6 kg)  04/25/20 235 lb 1.6 oz (106.6 kg)   BMI Readings from Last 5 Encounters:  07/11/21 26.30 kg/m  05/09/21 27.17 kg/m  02/07/21 29.89 kg/m  11/08/20 30.48 kg/m  04/25/20 34.72 kg/m   Labs done previously were all unremarkable including normal TSH and free T4  Patient Active Problem List   Diagnosis Date Noted   Abnormal transaminases 08/04/2019   Psychophysiological insomnia 08/04/2019   Anxiety disorder 08/04/2019   Hyperlipidemia 05/24/2019   Myalgia due to statin 05/24/2019   Immunocompromised state due to drug therapy ([place]) 01/24/2019   Cervical lymphadenopathy 01/24/2019   Chronic rhinitis 01/24/2019   Class 1 obesity due to excess calories with serious comorbidity and body mass index (BMI) of 32.0 to 32.9 in adult 09/17/2018   Elevated LFTs 09/17/2018   Hypertriglyceridemia  09/17/2018   History of nephrectomy, right 09/17/2018   Hidradenitis suppurativa 09/17/2018   Thrombocytosis 09/17/2018      Current Outpatient Medications:    busPIRone (BUSPAR) 10 MG tablet, Take 1 tablet (10 mg total) by mouth 3 (three) times daily as needed., Disp: 270 tablet, Rfl: 3   citalopram (CELEXA) 40 MG tablet, Take 1 tablet (40 mg total) by mouth daily., Disp: 90 tablet, Rfl: 3   ezetimibe (ZETIA) 10 MG tablet, Take 1 tablet (10 mg total) by mouth daily., Disp: 90 tablet, Rfl: 3   rosuvastatin (CRESTOR) 5 MG tablet, Take 1 tablet (5 mg total) by mouth daily., Disp: 90 tablet, Rfl: 3   No Known Allergies   Social History   Tobacco Use   Smoking status: Former    Packs/day: 0.50    Years: 15.00    Total pack years: 7.50    Types: Cigarettes   Smokeless tobacco: Never  Vaping Use   Vaping Use: Every day   Substances: Nicotine, Flavoring  Substance Use Topics   Alcohol use: Never   Drug use: Never      Chart Review Today: I personally reviewed active problem list, medication list, allergies, family history, social history, health maintenance, notes from last encounter, lab results, imaging with the patient/caregiver today.   Review of Systems  Constitutional: Negative.   HENT: Negative.    Eyes: Negative.   Respiratory: Negative.  Cardiovascular: Negative.   Gastrointestinal: Negative.   Endocrine: Negative.   Genitourinary: Negative.   Musculoskeletal: Negative.   Skin: Negative.   Allergic/Immunologic: Negative.   Neurological: Negative.   Hematological: Negative.   Psychiatric/Behavioral: Negative.    All other systems reviewed and are negative.      Objective:   Vitals:   07/11/21 1408  BP: 104/62  Pulse: 77  Resp: 16  Temp: 98.3 F (36.8 C)  TempSrc: Oral  SpO2: 99%  Weight: 178 lb 1.6 oz (80.8 kg)  Height: _0  (1.753 m)    Body mass index is 26.3 kg/m.  Physical Exam Vitals and nursing note reviewed.  Constitutional:       General: He is not in acute distress.    Appearance: Normal appearance. He is well-developed and normal weight. He is not ill-appearing, toxic-appearing or diaphoretic.  HENT:     Head: Normocephalic and atraumatic.     Nose: Nose normal.  Eyes:     General:        Right eye: No discharge.        Left eye: No discharge.     Conjunctiva/sclera: Conjunctivae normal.  Neck:     Trachea: No tracheal deviation.  Cardiovascular:     Rate and Rhythm: Normal rate and regular rhythm.  Pulmonary:     Effort: Pulmonary effort is normal. No respiratory distress.     Breath sounds: No stridor.  Musculoskeletal:        General: Normal range of motion.  Skin:    General: Skin is warm and dry.     Findings: No rash.  Neurological:     Mental Status: He is alert.     Motor: No abnormal muscle tone.     Coordination: Coordination normal.  Psychiatric:        Behavior: Behavior normal.      Results for orders placed or performed in visit on 05/09/21  CBC with Differential/Platelet  Result Value Ref Range   WBC 9.1 3.4 - 10.8 x10E3/uL   RBC 5.05 4.14 - 5.80 x10E6/uL   Hemoglobin 15.5 13.0 - 17.7 g/dL   Hematocrit 44.8 37.5 - 51.0 %   MCV 89 79 - 97 fL   MCH 30.7 26.6 - 33.0 pg   MCHC 34.6 31.5 - 35.7 g/dL   RDW 12.3 11.6 - 15.4 %   Platelets 478 (H) 150 - 450 x10E3/uL   Neutrophils 53 Not Estab. %   Lymphs 37 Not Estab. %   Monocytes 7 Not Estab. %   Eos 2 Not Estab. %   Basos 1 Not Estab. %   Neutrophils Absolute 4.8 1.4 - 7.0 x10E3/uL   Lymphocytes Absolute 3.4 (H) 0.7 - 3.1 x10E3/uL   Monocytes Absolute 0.6 0.1 - 0.9 x10E3/uL   EOS (ABSOLUTE) 0.2 0.0 - 0.4 x10E3/uL   Basophils Absolute 0.1 0.0 - 0.2 x10E3/uL   Immature Granulocytes 0 Not Estab. %   Immature Grans (Abs) 0.0 0.0 - 0.1 x10E3/uL  Parathyroid hormone, intact (no Ca)  Result Value Ref Range   PTH 38 15 - 65 pg/mL  T4, free  Result Value Ref Range   Free T4 1.17 0.82 - 1.77 ng/dL  TSH  Result Value Ref Range    TSH 1.570 0.450 - 4.500 uIU/mL  Comprehensive metabolic panel  Result Value Ref Range   Glucose 86 70 - 99 mg/dL   BUN 13 6 - 20 mg/dL   Creatinine, Ser 1.07 0.76 - 1.27 mg/dL  eGFR 92 >59 mL/min/1.73   BUN/Creatinine Ratio 12 9 - 20   Sodium 139 134 - 144 mmol/L   Potassium 5.4 (H) 3.5 - 5.2 mmol/L   Chloride 105 96 - 106 mmol/L   CO2 24 20 - 29 mmol/L   Calcium 10.4 (H) 8.7 - 10.2 mg/dL   Total Protein 7.3 6.0 - 8.5 g/dL   Albumin 4.7 4.0 - 5.0 g/dL   Globulin, Total 2.6 1.5 - 4.5 g/dL   Albumin/Globulin Ratio 1.8 1.2 - 2.2   Bilirubin Total 0.3 0.0 - 1.2 mg/dL   Alkaline Phosphatase 76 44 - 121 IU/L   AST 25 0 - 40 IU/L   ALT 33 0 - 44 IU/L       Assessment & Plan:     ICD-10-CM   1. Weight loss, unintentional  R63.4      He has lost about 6 more pounds Encouraged him to increase his caloric intake with healthy foods high in protein and healthy fats about 300 to 500 cal more per day to avoid any further weight loss If still losing weight in the next 3 months he was asked to get a follow-up appointment for reevaluation     Delsa Grana, PA-C 07/11/21 4:20 PM

## 2021-07-19 ENCOUNTER — Ambulatory Visit: Payer: Medicaid Other | Admitting: Family Medicine

## 2021-07-19 ENCOUNTER — Encounter: Payer: Self-pay | Admitting: Family Medicine

## 2021-07-19 VITALS — BP 124/80 | HR 87 | Temp 97.7°F | Resp 16 | Ht 69.0 in | Wt 180.8 lb

## 2021-07-19 DIAGNOSIS — F419 Anxiety disorder, unspecified: Secondary | ICD-10-CM | POA: Diagnosis not present

## 2021-07-19 DIAGNOSIS — Z0289 Encounter for other administrative examinations: Secondary | ICD-10-CM | POA: Diagnosis not present

## 2021-07-19 NOTE — Progress Notes (Signed)
Patient ID: Mario Boyd, male    DOB: 1985/08/17, 36 y.o.   MRN: [id number]  PCP: Danelle Berry, PA-C  Chief Complaint  Patient presents with   Form Completion    Subjective:   Mario Boyd is a 36 y.o. male, presents to clinic with CC of the following:  HPI  Pt presents asking for letter for new possibly job stating some of his diagnosis  Patient Active Problem List   Diagnosis Date Noted   Abnormal transaminases 08/04/2019   Psychophysiological insomnia 08/04/2019   Anxiety disorder 08/04/2019   Hyperlipidemia 05/24/2019   Myalgia due to statin 05/24/2019   Immunocompromised state due to drug therapy ([facility]) 01/24/2019   Cervical lymphadenopathy 01/24/2019   Chronic rhinitis 01/24/2019   Class 1 obesity due to excess calories with serious comorbidity and body mass index (BMI) of 32.0 to 32.9 in adult 09/17/2018   Elevated LFTs 09/17/2018   Hypertriglyceridemia 09/17/2018   History of nephrectomy, right 09/17/2018   Hidradenitis suppurativa 09/17/2018   Thrombocytosis 09/17/2018      Current Outpatient Medications:    busPIRone (BUSPAR) 10 MG tablet, Take 1 tablet (10 mg total) by mouth 3 (three) times daily as needed., Disp: 270 tablet, Rfl: 3   citalopram (CELEXA) 40 MG tablet, Take 1 tablet (40 mg total) by mouth daily., Disp: 90 tablet, Rfl: 3   ezetimibe (ZETIA) 10 MG tablet, Take 1 tablet (10 mg total) by mouth daily., Disp: 90 tablet, Rfl: 3   rosuvastatin (CRESTOR) 5 MG tablet, Take 1 tablet (5 mg total) by mouth daily., Disp: 90 tablet, Rfl: 3   No Known Allergies   Social History   Tobacco Use   Smoking status: Former    Packs/day: 0.50    Years: 15.00    Total pack years: 7.50    Types: Cigarettes   Smokeless tobacco: Never  Vaping Use   Vaping Use: Every day   Substances: Nicotine, Flavoring  Substance Use Topics   Alcohol use: Never   Drug use: Never      Chart Review Today: I have reviewed the patient's medical history in detail and  updated the computerized patient record.   Review of Systems  Constitutional: Negative.   HENT: Negative.    Eyes: Negative.   Respiratory: Negative.    Cardiovascular: Negative.   Gastrointestinal: Negative.   Endocrine: Negative.   Genitourinary: Negative.   Musculoskeletal: Negative.   Skin: Negative.   Allergic/Immunologic: Negative.   Neurological: Negative.   Hematological: Negative.   Psychiatric/Behavioral: Negative.    All other systems reviewed and are negative.      Objective:   Vitals:   07/19/21 1508  BP: 124/80  Pulse: 87  Resp: 16  Temp: 97.7 F (36.5 C)  TempSrc: Oral  SpO2: 98%  Weight: 180 lb 12.8 oz (82 kg)  Height: 5\' 9"  (1.753 m)    Body mass index is 26.7 kg/m.  Physical Exam Vitals and nursing note reviewed.  Constitutional:      General: He is not in acute distress.    Appearance: Normal appearance. He is well-developed. He is not ill-appearing, toxic-appearing or diaphoretic.  HENT:     Head: Normocephalic and atraumatic.     Nose: Nose normal.  Eyes:     General:        Right eye: No discharge.        Left eye: No discharge.     Conjunctiva/sclera: Conjunctivae normal.  Neck:     Trachea: No  tracheal deviation.  Cardiovascular:     Rate and Rhythm: Normal rate and regular rhythm.  Pulmonary:     Effort: Pulmonary effort is normal. No respiratory distress.     Breath sounds: No stridor.  Musculoskeletal:        General: Normal range of motion.  Skin:    General: Skin is warm and dry.     Findings: No rash.  Neurological:     Mental Status: He is alert.     Motor: No abnormal muscle tone.     Coordination: Coordination normal.  Psychiatric:        Attention and Perception: Attention normal.        Mood and Affect: Mood and affect normal.        Speech: Speech normal.        Behavior: Behavior normal. Behavior is cooperative.        Thought Content: Thought content normal.      Results for orders placed or performed  in visit on 05/09/21  CBC with Differential/Platelet  Result Value Ref Range   WBC 9.1 3.4 - 10.8 x10E3/uL   RBC 5.05 4.14 - 5.80 x10E6/uL   Hemoglobin 15.5 13.0 - 17.7 g/dL   Hematocrit 44.8 37.5 - 51.0 %   MCV 89 79 - 97 fL   MCH 30.7 26.6 - 33.0 pg   MCHC 34.6 31.5 - 35.7 g/dL   RDW 12.3 11.6 - 15.4 %   Platelets 478 (H) 150 - 450 x10E3/uL   Neutrophils 53 Not Estab. %   Lymphs 37 Not Estab. %   Monocytes 7 Not Estab. %   Eos 2 Not Estab. %   Basos 1 Not Estab. %   Neutrophils Absolute 4.8 1.4 - 7.0 x10E3/uL   Lymphocytes Absolute 3.4 (H) 0.7 - 3.1 x10E3/uL   Monocytes Absolute 0.6 0.1 - 0.9 x10E3/uL   EOS (ABSOLUTE) 0.2 0.0 - 0.4 x10E3/uL   Basophils Absolute 0.1 0.0 - 0.2 x10E3/uL   Immature Granulocytes 0 Not Estab. %   Immature Grans (Abs) 0.0 0.0 - 0.1 x10E3/uL  Parathyroid hormone, intact (no Ca)  Result Value Ref Range   PTH 38 15 - 65 pg/mL  T4, free  Result Value Ref Range   Free T4 1.17 0.82 - 1.77 ng/dL  TSH  Result Value Ref Range   TSH 1.570 0.450 - 4.500 uIU/mL  Comprehensive metabolic panel  Result Value Ref Range   Glucose 86 70 - 99 mg/dL   BUN 13 6 - 20 mg/dL   Creatinine, Ser 1.07 0.76 - 1.27 mg/dL   eGFR 92 >59 mL/min/1.73   BUN/Creatinine Ratio 12 9 - 20   Sodium 139 134 - 144 mmol/L   Potassium 5.4 (H) 3.5 - 5.2 mmol/L   Chloride 105 96 - 106 mmol/L   CO2 24 20 - 29 mmol/L   Calcium 10.4 (H) 8.7 - 10.2 mg/dL   Total Protein 7.3 6.0 - 8.5 g/dL   Albumin 4.7 4.0 - 5.0 g/dL   Globulin, Total 2.6 1.5 - 4.5 g/dL   Albumin/Globulin Ratio 1.8 1.2 - 2.2   Bilirubin Total 0.3 0.0 - 1.2 mg/dL   Alkaline Phosphatase 76 44 - 121 IU/L   AST 25 0 - 40 IU/L   ALT 33 0 - 44 IU/L       Assessment & Plan:     ICD-10-CM   1. Anxiety disorder, unspecified type  F41.9     2. Encounter for completion of form with  patient  Z57.89      Requested letter drafted and signed today - given to pt, copy in chart. No other concerns or needs  today.     Delsa Grana, PA-C 07/19/21 3:23 PM

## 2021-07-25 ENCOUNTER — Ambulatory Visit: Payer: Medicaid Other | Admitting: Dermatology

## 2021-08-30 DIAGNOSIS — M542 Cervicalgia: Secondary | ICD-10-CM | POA: Diagnosis not present

## 2021-08-30 DIAGNOSIS — M545 Low back pain, unspecified: Secondary | ICD-10-CM | POA: Diagnosis not present

## 2021-08-30 DIAGNOSIS — F17290 Nicotine dependence, other tobacco product, uncomplicated: Secondary | ICD-10-CM | POA: Diagnosis not present

## 2021-08-30 DIAGNOSIS — M4182 Other forms of scoliosis, cervical region: Secondary | ICD-10-CM | POA: Diagnosis not present

## 2021-08-30 DIAGNOSIS — I10 Essential (primary) hypertension: Secondary | ICD-10-CM | POA: Diagnosis not present

## 2021-08-30 DIAGNOSIS — R000 Tachycardia, unspecified: Secondary | ICD-10-CM | POA: Diagnosis not present

## 2021-08-30 DIAGNOSIS — M79603 Pain in arm, unspecified: Secondary | ICD-10-CM | POA: Diagnosis not present

## 2021-09-24 ENCOUNTER — Ambulatory Visit: Payer: Medicaid Other | Admitting: Family Medicine

## 2021-10-23 ENCOUNTER — Ambulatory Visit: Payer: Medicaid Other | Admitting: Dermatology

## 2022-02-17 ENCOUNTER — Ambulatory Visit: Payer: BC Managed Care – PPO | Admitting: Family Medicine

## 2022-02-20 ENCOUNTER — Ambulatory Visit: Payer: BC Managed Care – PPO | Admitting: Family Medicine

## 2022-02-20 NOTE — Progress Notes (Deleted)
Patient ID: Mario Boyd, male    DOB: 02-21-1985, 37 y.o.   MRN: [id number]  PCP: Delsa Grana, PA-C  No chief complaint on file.   Subjective:   Mario Boyd is a 37 y.o. male, presents to clinic with CC of the following:  HPI  Presents with BP concerns BP Readings from Last 3 Encounters:  07/19/21 124/80  07/11/21 104/62  05/09/21 118/84     Patient Active Problem List   Diagnosis Date Noted   Abnormal transaminases 08/04/2019   Psychophysiological insomnia 08/04/2019   Anxiety disorder 08/04/2019   Hyperlipidemia 05/24/2019   Myalgia due to statin 05/24/2019   Immunocompromised state due to drug therapy ([facility]) 01/24/2019   Cervical lymphadenopathy 01/24/2019   Chronic rhinitis 01/24/2019   Class 1 obesity due to excess calories with serious comorbidity and body mass index (BMI) of 32.0 to 32.9 in adult 09/17/2018   Elevated LFTs 09/17/2018   Hypertriglyceridemia 09/17/2018   History of nephrectomy, right 09/17/2018   Hidradenitis suppurativa 09/17/2018   Thrombocytosis 09/17/2018      Current Outpatient Medications:    busPIRone (BUSPAR) 10 MG tablet, Take 1 tablet (10 mg total) by mouth 3 (three) times daily as needed., Disp: 270 tablet, Rfl: 3   citalopram (CELEXA) 40 MG tablet, Take 1 tablet (40 mg total) by mouth daily., Disp: 90 tablet, Rfl: 3   ezetimibe (ZETIA) 10 MG tablet, Take 1 tablet (10 mg total) by mouth daily., Disp: 90 tablet, Rfl: 3   rosuvastatin (CRESTOR) 5 MG tablet, Take 1 tablet (5 mg total) by mouth daily., Disp: 90 tablet, Rfl: 3   No Known Allergies   Social History   Tobacco Use   Smoking status: Former    Packs/day: 0.50    Years: 15.00    Total pack years: 7.50    Types: Cigarettes   Smokeless tobacco: Never  Vaping Use   Vaping Use: Every day   Substances: Nicotine, Flavoring  Substance Use Topics   Alcohol use: Never   Drug use: Never      Chart Review Today: ***  Review of Systems     Objective:    There were no vitals filed for this visit.  There is no height or weight on file to calculate BMI.  Physical Exam   Results for orders placed or performed in visit on 05/09/21  CBC with Differential/Platelet  Result Value Ref Range   WBC 9.1 3.4 - 10.8 x10E3/uL   RBC 5.05 4.14 - 5.80 x10E6/uL   Hemoglobin 15.5 13.0 - 17.7 g/dL   Hematocrit 44.8 37.5 - 51.0 %   MCV 89 79 - 97 fL   MCH 30.7 26.6 - 33.0 pg   MCHC 34.6 31.5 - 35.7 g/dL   RDW 12.3 11.6 - 15.4 %   Platelets 478 (H) 150 - 450 x10E3/uL   Neutrophils 53 Not Estab. %   Lymphs 37 Not Estab. %   Monocytes 7 Not Estab. %   Eos 2 Not Estab. %   Basos 1 Not Estab. %   Neutrophils Absolute 4.8 1.4 - 7.0 x10E3/uL   Lymphocytes Absolute 3.4 (H) 0.7 - 3.1 x10E3/uL   Monocytes Absolute 0.6 0.1 - 0.9 x10E3/uL   EOS (ABSOLUTE) 0.2 0.0 - 0.4 x10E3/uL   Basophils Absolute 0.1 0.0 - 0.2 x10E3/uL   Immature Granulocytes 0 Not Estab. %   Immature Grans (Abs) 0.0 0.0 - 0.1 x10E3/uL  Parathyroid hormone, intact (no Ca)  Result Value Ref Range  PTH 38 15 - 65 pg/mL  T4, free  Result Value Ref Range   Free T4 1.17 0.82 - 1.77 ng/dL  TSH  Result Value Ref Range   TSH 1.570 0.450 - 4.500 uIU/mL  Comprehensive metabolic panel  Result Value Ref Range   Glucose 86 70 - 99 mg/dL   BUN 13 6 - 20 mg/dL   Creatinine, Ser 1.07 0.76 - 1.27 mg/dL   eGFR 92 >59 mL/min/1.73   BUN/Creatinine Ratio 12 9 - 20   Sodium 139 134 - 144 mmol/L   Potassium 5.4 (H) 3.5 - 5.2 mmol/L   Chloride 105 96 - 106 mmol/L   CO2 24 20 - 29 mmol/L   Calcium 10.4 (H) 8.7 - 10.2 mg/dL   Total Protein 7.3 6.0 - 8.5 g/dL   Albumin 4.7 4.0 - 5.0 g/dL   Globulin, Total 2.6 1.5 - 4.5 g/dL   Albumin/Globulin Ratio 1.8 1.2 - 2.2   Bilirubin Total 0.3 0.0 - 1.2 mg/dL   Alkaline Phosphatase 76 44 - 121 IU/L   AST 25 0 - 40 IU/L   ALT 33 0 - 44 IU/L       Assessment & Plan:   ***     Delsa Grana, PA-C 02/20/22 12:05 PM

## 2022-03-30 DIAGNOSIS — R059 Cough, unspecified: Secondary | ICD-10-CM | POA: Diagnosis not present

## 2022-03-30 DIAGNOSIS — Z20822 Contact with and (suspected) exposure to covid-19: Secondary | ICD-10-CM | POA: Diagnosis not present

## 2022-03-30 DIAGNOSIS — U071 COVID-19: Secondary | ICD-10-CM | POA: Diagnosis not present

## 2022-07-03 DIAGNOSIS — J0100 Acute maxillary sinusitis, unspecified: Secondary | ICD-10-CM | POA: Diagnosis not present

## 2022-07-03 DIAGNOSIS — H53142 Visual discomfort, left eye: Secondary | ICD-10-CM | POA: Diagnosis not present

## 2022-08-06 DIAGNOSIS — H8110 Benign paroxysmal vertigo, unspecified ear: Secondary | ICD-10-CM | POA: Diagnosis not present

## 2022-08-12 NOTE — Patient Instructions (Signed)
Preventive Care 21-39 Years Old, Male Preventive care refers to lifestyle choices and visits with your health care provider that can promote health and wellness. Preventive care visits are also called wellness exams. What can I expect for my preventive care visit? Counseling During your preventive care visit, your health care provider may ask about your: Medical history, including: Past medical problems. Family medical history. Current health, including: Emotional well-being. Home life and relationship well-being. Sexual activity. Lifestyle, including: Alcohol, nicotine or tobacco, and drug use. Access to firearms. Diet, exercise, and sleep habits. Safety issues such as seatbelt and bike helmet use. Sunscreen use. Work and work environment. Physical exam Your health care provider may check your: Height and weight. These may be used to calculate your BMI (body mass index). BMI is a measurement that tells if you are at a healthy weight. Waist circumference. This measures the distance around your waistline. This measurement also tells if you are at a healthy weight and may help predict your risk of certain diseases, such as type 2 diabetes and high blood pressure. Heart rate and blood pressure. Body temperature. Skin for abnormal spots. What immunizations do I need?  Vaccines are usually given at various ages, according to a schedule. Your health care provider will recommend vaccines for you based on your age, medical history, and lifestyle or other factors, such as travel or where you work. What tests do I need? Screening Your health care provider may recommend screening tests for certain conditions. This may include: Lipid and cholesterol levels. Diabetes screening. This is done by checking your blood sugar (glucose) after you have not eaten for a while (fasting). Hepatitis B test. Hepatitis C test. HIV (human immunodeficiency virus) test. STI (sexually transmitted infection)  testing, if you are at risk. Talk with your health care provider about your test results, treatment options, and if necessary, the need for more tests. Follow these instructions at home: Eating and drinking  Eat a healthy diet that includes fresh fruits and vegetables, whole grains, lean protein, and low-fat dairy products. Drink enough fluid to keep your urine pale yellow. Take vitamin and mineral supplements as recommended by your health care provider. Do not drink alcohol if your health care provider tells you not to drink. If you drink alcohol: Limit how much you have to 0-2 drinks a day. Know how much alcohol is in your drink. In the [place] one drink equals one 12 oz bottle of beer (355 mL), one 5 oz glass of wine (148 mL), or one 1 oz glass of hard liquor (44 mL). Lifestyle Brush your teeth every morning and night with fluoride toothpaste. Floss one time each day. Exercise for at least 30 minutes 5 or more days each week. Do not use any products that contain nicotine or tobacco. These products include cigarettes, chewing tobacco, and vaping devices, such as e-cigarettes. If you need help quitting, ask your health care provider. Do not use drugs. If you are sexually active, practice safe sex. Use a condom or other form of protection to prevent STIs. Find healthy ways to manage stress, such as: Meditation, yoga, or listening to music. Journaling. Talking to a trusted person. Spending time with friends and family. Minimize exposure to UV radiation to reduce your risk of skin cancer. Safety Always wear your seat belt while driving or riding in a vehicle. Do not drive: If you have been drinking alcohol. Do not ride with someone who has been drinking. If you have been using any mind-altering substances   or drugs. While texting. When you are tired or distracted. Wear a helmet and other protective equipment during sports activities. If you have firearms in your house, make sure you  follow all gun safety procedures. Seek help if you have been physically or sexually abused. What's next? Go to your health care provider once a year for an annual wellness visit. Ask your health care provider how often you should have your eyes and teeth checked. Stay up to date on all vaccines. This information is not intended to replace advice given to you by your health care provider. Make sure you discuss any questions you have with your health care provider. Document Revised: 06/20/2020 Document Reviewed: 06/20/2020 [employer] Patient Education  2024 [employer]  

## 2022-08-13 ENCOUNTER — Ambulatory Visit (INDEPENDENT_AMBULATORY_CARE_PROVIDER_SITE_OTHER): Payer: BC Managed Care – PPO | Admitting: Family Medicine

## 2022-08-13 ENCOUNTER — Encounter: Payer: Self-pay | Admitting: Family Medicine

## 2022-08-13 VITALS — BP 104/76 | HR 87 | Temp 98.4°F | Resp 16 | Ht 69.0 in | Wt 196.7 lb

## 2022-08-13 DIAGNOSIS — F419 Anxiety disorder, unspecified: Secondary | ICD-10-CM | POA: Diagnosis not present

## 2022-08-13 DIAGNOSIS — E785 Hyperlipidemia, unspecified: Secondary | ICD-10-CM

## 2022-08-13 DIAGNOSIS — D75839 Thrombocytosis, unspecified: Secondary | ICD-10-CM

## 2022-08-13 DIAGNOSIS — Z0000 Encounter for general adult medical examination without abnormal findings: Secondary | ICD-10-CM | POA: Diagnosis not present

## 2022-08-13 DIAGNOSIS — E559 Vitamin D deficiency, unspecified: Secondary | ICD-10-CM

## 2022-08-13 DIAGNOSIS — F5104 Psychophysiologic insomnia: Secondary | ICD-10-CM | POA: Diagnosis not present

## 2022-08-13 DIAGNOSIS — E781 Pure hyperglyceridemia: Secondary | ICD-10-CM | POA: Diagnosis not present

## 2022-08-13 LAB — CBC WITH DIFFERENTIAL/PLATELET
Absolute Monocytes: 587 cells/uL (ref 200–950)
Basophils Absolute: 102 cells/uL (ref 0–200)
Basophils Relative: 1.2 %
Eosinophils Absolute: 128 cells/uL (ref 15–500)
Eosinophils Relative: 1.5 %
HCT: 45 % (ref 38.5–50.0)
Hemoglobin: 15.4 g/dL (ref 13.2–17.1)
Lymphs Abs: 3179 cells/uL (ref 850–3900)
MCH: 30.2 pg (ref 27.0–33.0)
MCHC: 34.2 g/dL (ref 32.0–36.0)
MCV: 88.2 fL (ref 80.0–100.0)
MPV: 9.4 fL (ref 7.5–12.5)
Monocytes Relative: 6.9 %
Neutro Abs: 4505 cells/uL (ref 1500–7800)
Neutrophils Relative %: 53 %
Platelets: 442 10*3/uL — ABNORMAL HIGH (ref 140–400)
RBC: 5.1 10*6/uL (ref 4.20–5.80)
RDW: 12.5 % (ref 11.0–15.0)
Total Lymphocyte: 37.4 %
WBC: 8.5 10*3/uL (ref 3.8–10.8)

## 2022-08-13 NOTE — Progress Notes (Signed)
Patient: Mario Boyd, Male    DOB: October 27, 1985, 37 y.o.   MRN: [id number] Mario Berry, PA-C Visit Date: 08/13/2022  Today's Provider: Danelle Berry, PA-C   Chief Complaint  Patient presents with   Annual Exam   Subjective:   Annual physical exam:  Mario Boyd is a 37 y.o. male who presents today for health maintenance and annual & complete physical exam.   Exercise/Activity:  no exercising  Diet/nutrition:     not eating very much, eating healthier Sleep:  he reports sleeping well for him about 4-6 hours  SDOH Screenings   Food Insecurity: No Food Insecurity (08/13/2022)  Housing: Low Risk  (08/13/2022)  Transportation Needs: No Transportation Needs (08/13/2022)  Utilities: Not At Risk (08/13/2022)  Alcohol Screen: Low Risk  (08/13/2022)  Depression (PHQ2-9): Low Risk  (08/13/2022)  Financial Resource Strain: Low Risk  (08/13/2022)  Physical Activity: Inactive (08/13/2022)  Social Connections: Moderately Integrated (08/13/2022)  Stress: Stress Concern Present (08/13/2022)  Tobacco Use: Medium Risk (08/13/2022)  Health Literacy: Adequate Health Literacy (08/13/2022)    USPSTF grade A and B recommendations - reviewed and addressed today  Depression:  Phq 9 completed today by patient, was reviewed by me with patient in the room, score is  negative, pt feels good, anxiety     08/13/2022    3:04 PM 07/19/2021    3:08 PM 07/11/2021    2:07 PM  Depression screen PHQ 2/9  Decreased Interest 0 0 0  Down, Depressed, Hopeless 0 0 0  PHQ - 2 Score 0 0 0  Altered sleeping 0 0 0  Tired, decreased energy 0 0 0  Change in appetite 0 0 0  Feeling bad or failure about yourself  0 0 0  Trouble concentrating 0 0 0  Moving slowly or fidgety/restless 0 0 0  Suicidal thoughts 0 0 0  PHQ-9 Score 0 0 0  Difficult doing work/chores Not difficult at all Not difficult at all Not difficult at all    Hep C Screening: done  STD testing and prevention (HIV/chl/gon/syphilis):  none needed  Intimate partner  violence: safe at home, wife and two kids  Advanced Care Planning:  A voluntary discussion about advance care planning including the explanation and discussion of advance directives.  Discussed health care proxy and Living will, and the patient was able to identify a health care proxy as  Mario Boyd- father.  Patient does not have a living will at present time. If patient does have living will, I have requested they bring this to the clinic to be scanned in to their chart.  Health Maintenance  Topic Date Due   INFLUENZA VACCINE  08/07/2022   COVID-19 Vaccine (3 - Moderna risk series) 08/29/2022 (Originally 05/24/2019)   DTaP/Tdap/Td (2 - Td or Tdap) 12/16/2028   Hepatitis C Screening  Completed   HIV Screening  Completed   HPV VACCINES  Aged Out    Skin cancer: none  last skin survey was.  Pt reports no hx of skin cancer, suspicious lesions/biopsies in the past.  Colorectal cancer:  colonoscopy is 43 Pt denies melena, hematochezia, change in BM pattern or caliber    Prostate cancer:   Prostate cancer screening with PSA: nod due per age and sx - discussed No results found for: "PSA"  Urinary Symptoms:  no urinary sx  Lung cancer:   Low Dose CT Chest recommended if Age 55-80 years, 20 pack-year currently smoking OR have quit w/in 15years. Patient does not qualify.  Social History   Tobacco Use   Smoking status: Former    Current packs/day: 0.50    Average packs/day: 0.5 packs/day for 15.0 years (7.5 ttl pk-yrs)    Types: Cigarettes   Smokeless tobacco: Never  Substance Use Topics   Alcohol use: Yes    Comment: Rarely     Alcohol screening: Flowsheet Row Office Visit from 08/13/2022 in [facility]  AUDIT-C Score 1       AAA:  The USPSTF recommends one-time screening with ultrasonography in men ages 56 to 29 years who have ever smoked  ZOX:WRUE indicated today  Blood pressure/Hypertension: BP Readings from Last 3 Encounters:  08/13/22  104/76  07/19/21 124/80  07/11/21 104/62   Weight/Obesity: Wt Readings from Last 3 Encounters:  08/13/22 196 lb 11.2 oz (89.2 kg)  07/19/21 180 lb 12.8 oz (82 kg)  07/11/21 178 lb 1.6 oz (80.8 kg)   BMI Readings from Last 3 Encounters:  08/13/22 29.05 kg/m  07/19/21 26.70 kg/m  07/11/21 26.30 kg/m    Lipids:  Lab Results  Component Value Date   CHOL 167 11/08/2020   CHOL 184 04/25/2020   CHOL 209 (H) 10/10/2019   Lab Results  Component Value Date   HDL 42 11/08/2020   HDL 52 04/25/2020   HDL 50 10/10/2019   Lab Results  Component Value Date   LDLCALC 97 11/08/2020   LDLCALC 80 04/25/2020   LDLCALC 118 (H) 10/10/2019   Lab Results  Component Value Date   TRIG 162 (H) 11/08/2020   TRIG 392 (H) 04/25/2020   TRIG 306 (H) 10/10/2019   Lab Results  Component Value Date   CHOLHDL 4.0 11/08/2020   CHOLHDL 3.5 04/25/2020   CHOLHDL 4.2 10/10/2019   No results found for: "LDLDIRECT" Based on the results of lipid panel his/her cardiovascular risk factor ( using Poole Cohort )  in the next 10 years is : The ASCVD Risk score (Arnett DK, et al., 2019) failed to calculate for the following reasons:   The 2019 ASCVD risk score is only valid for ages 53 to 67 Glucose:  Glucose  Date Value Ref Range Status  05/13/2021 86 70 - 99 mg/dL Final   Glucose, Bld  Date Value Ref Range Status  04/25/2020 80 65 - 99 mg/dL Final    Comment:    .            Fasting reference interval .   10/10/2019 79 65 - 99 mg/dL Final    Comment:    .            Fasting reference interval .   05/24/2019 77 65 - 99 mg/dL Final    Comment:    .            Fasting reference interval .     Social History       Social History   Socioeconomic History   Marital status: Married    Spouse name: Mario Boyd   Number of children: 2   Years of education: 12   Highest education level: Associate degree: occupational, Scientist, product/process development, or vocational program  Occupational History   Occupation:  unemployed  Tobacco Use   Smoking status: Former    Current packs/day: 0.50    Average packs/day: 0.5 packs/day for 15.0 years (7.5 ttl pk-yrs)    Types: Cigarettes   Smokeless tobacco: Never  Vaping Use   Vaping status: Every Day   Substances: Nicotine, Flavoring  Substance and  Sexual Activity   Alcohol use: Yes    Comment: Rarely   Drug use: Never   Sexual activity: Yes    Comment: wife on birth control, 9 years  Other Topics Concern   Not on file  Social History Narrative   Vape; quit smoking; no alcohol; works at [employer]; in [place]/wife;and 2 children.    Social Determinants of Health   Financial Resource Strain: Low Risk  (08/13/2022)   Overall Financial Resource Strain (CARDIA)    Difficulty of Paying Living Expenses: Not hard at all  Food Insecurity: No Food Insecurity (08/13/2022)   Hunger Vital Sign    Worried About Running Out of Food in the Last Year: Never true    Ran Out of Food in the Last Year: Never true  Transportation Needs: No Transportation Needs (08/13/2022)   PRAPARE - Administrator, Civil Service (Medical): No    Lack of Transportation (Non-Medical): No  Physical Activity: Inactive (08/13/2022)   Exercise Vital Sign    Days of Exercise per Week: 0 days    Minutes of Exercise per Session: 0 min  Stress: Stress Concern Present (08/13/2022)   [employer] of Occupational Health - Occupational Stress Questionnaire    Feeling of Stress : Very much  Social Connections: Moderately Integrated (08/13/2022)   Social Connection and Isolation Panel [NHANES]    Frequency of Communication with Friends and Family: More than three times a week    Frequency of Social Gatherings with Friends and Family: More than three times a week    Attends Religious Services: More than 4 times per year    Active Member of [employer] or Organizations: No    Attends Engineer, structural: Never    Marital Status: Married    Family History        Family History   Problem Relation Age of Onset   Hyperlipidemia Mother    Hypertension Mother    Other Mother        thrombocytosis   Hyperlipidemia Father    Hypertension Father    Epilepsy Sister    Hypertension Sister    Epilepsy Son    Heart failure Maternal Grandmother    Diabetes Maternal Grandfather    Heart failure Maternal Grandfather    Other Maternal Grandfather        thrombocytosis   Diabetes Paternal Grandmother    Hypertension Paternal Grandfather    Prostate cancer Paternal Grandfather     Patient Active Problem List   Diagnosis Date Noted   Abnormal transaminases 08/04/2019   Psychophysiological insomnia 08/04/2019   Anxiety disorder 08/04/2019   Hyperlipidemia 05/24/2019   Myalgia due to statin 05/24/2019   Immunocompromised state due to drug therapy (HCC) 01/24/2019   Cervical lymphadenopathy 01/24/2019   Chronic rhinitis 01/24/2019   Class 1 obesity due to excess calories with serious comorbidity and body mass index (BMI) of 32.0 to 32.9 in adult 09/17/2018   Elevated LFTs 09/17/2018   Hypertriglyceridemia 09/17/2018   History of nephrectomy, right 09/17/2018   Hidradenitis suppurativa 09/17/2018   Thrombocytosis 09/17/2018    Past Surgical History:  Procedure Laterality Date   HERNIA REPAIR     KIDNEY SURGERY     NEPHRECTOMY LIVING DONOR Right 2009   TONSILLECTOMY     TYMPANOSTOMY TUBE PLACEMENT       Current Outpatient Medications:    busPIRone (BUSPAR) 10 MG tablet, Take 1 tablet (10 mg total) by mouth 3 (three) times daily as needed. (Patient  not taking: Reported on 08/13/2022), Disp: 270 tablet, Rfl: 3   citalopram (CELEXA) 40 MG tablet, Take 1 tablet (40 mg total) by mouth daily. (Patient not taking: Reported on 08/13/2022), Disp: 90 tablet, Rfl: 3   ezetimibe (ZETIA) 10 MG tablet, Take 1 tablet (10 mg total) by mouth daily. (Patient not taking: Reported on 08/13/2022), Disp: 90 tablet, Rfl: 3   meclizine (ANTIVERT) 25 MG tablet, Take 25 mg by mouth 3 (three)  times daily as needed. (Patient not taking: Reported on 08/13/2022), Disp: , Rfl:    rosuvastatin (CRESTOR) 5 MG tablet, Take 1 tablet (5 mg total) by mouth daily. (Patient not taking: Reported on 08/13/2022), Disp: 90 tablet, Rfl: 3  No Known Allergies  Patient Care Team: Mario Berry, PA-C as PCP - General (Family Medicine)   Chart Review: I personally reviewed active problem list, medication list, allergies, family history, social history, health maintenance, notes from last encounter, lab results, imaging with the patient/caregiver today.   Review of Systems  Constitutional: Negative.   HENT: Negative.    Eyes: Negative.   Respiratory: Negative.    Cardiovascular: Negative.   Gastrointestinal: Negative.   Endocrine: Negative.   Genitourinary: Negative.   Musculoskeletal: Negative.   Skin: Negative.   Allergic/Immunologic: Negative.   Neurological: Negative.   Hematological: Negative.   Psychiatric/Behavioral: Negative.    All other systems reviewed and are negative.         Objective:   Vitals:  Vitals:   08/13/22 1511  BP: 104/76  Pulse: 87  Resp: 16  Temp: 98.4 F (36.9 C)  TempSrc: Oral  SpO2: 99%  Weight: 196 lb 11.2 oz (89.2 kg)  Height: 5\' 9"  (1.753 m)    Body mass index is 29.05 kg/m.  Physical Exam Vitals and nursing note reviewed.  Constitutional:      General: He is not in acute distress.    Appearance: Normal appearance. He is well-developed. He is not ill-appearing or toxic-appearing.  HENT:     Head: Normocephalic and atraumatic.     Jaw: No trismus.     Right Ear: Tympanic membrane, ear canal and external ear normal.     Left Ear: Tympanic membrane, ear canal and external ear normal.     Nose: No mucosal edema.     Right Sinus: No maxillary sinus tenderness or frontal sinus tenderness.     Left Sinus: No maxillary sinus tenderness or frontal sinus tenderness.     Mouth/Throat:     Pharynx: Uvula midline. No oropharyngeal exudate,  posterior oropharyngeal erythema or uvula swelling.  Eyes:     General: Lids are normal.     Conjunctiva/sclera: Conjunctivae normal.     Pupils: Pupils are equal, round, and reactive to light.  Neck:     Trachea: Trachea and phonation normal. No tracheal deviation.  Cardiovascular:     Rate and Rhythm: Normal rate and regular rhythm.     Pulses: Normal pulses.          Radial pulses are 2+ on the right side and 2+ on the left side.       Posterior tibial pulses are 2+ on the right side and 2+ on the left side.     Heart sounds: Normal heart sounds. No murmur heard.    No friction rub. No gallop.  Pulmonary:     Effort: Pulmonary effort is normal.     Breath sounds: Normal breath sounds. No wheezing, rhonchi or rales.  Abdominal:  General: Bowel sounds are normal. There is no distension.     Palpations: Abdomen is soft.     Tenderness: There is no abdominal tenderness. There is no guarding or rebound.  Musculoskeletal:     Cervical back: Normal range of motion and neck supple.     Right lower leg: No edema.     Left lower leg: No edema.  Skin:    General: Skin is warm and dry.     Capillary Refill: Capillary refill takes less than 2 seconds.     Findings: No rash.  Neurological:     Mental Status: He is alert. Mental status is at baseline.     Gait: Gait normal.  Psychiatric:        Mood and Affect: Mood normal.        Speech: Speech normal.        Behavior: Behavior normal.      No results found for this or any previous visit (from the past 2160 hour(s)).  Fall Risk:    08/13/2022    3:04 PM 07/19/2021    3:07 PM 07/11/2021    2:07 PM 05/09/2021    2:55 PM 02/07/2021    9:37 AM  Fall Risk   Falls in the past year? 0 0 0 0 0  Number falls in past yr: 0 0 0 0 0  Injury with Fall? 0 0 0 0 0  Risk for fall due to : No Fall Risks No Fall Risks No Fall Risks No Fall Risks   Follow up Falls prevention discussed;Education provided;Falls evaluation completed Education  provided;Falls prevention discussed Falls prevention discussed;Education provided Falls prevention discussed Falls evaluation completed    Functional Status Survey: Is the patient deaf or have difficulty hearing?: No Does the patient have difficulty seeing, even when wearing glasses/contacts?: No Does the patient have difficulty concentrating, remembering, or making decisions?: Yes Does the patient have difficulty walking or climbing stairs?: No Does the patient have difficulty dressing or bathing?: No Does the patient have difficulty doing errands alone such as visiting a doctor's office or shopping?: No   Assessment & Plan:    CPE completed today  Prostate cancer screening and PSA options (with potential risks and benefits of testing vs not testing) were discussed along with recent recs/guidelines, shared decision making and handout/information given to pt today  USPSTF grade A and B recommendations reviewed with patient; age-appropriate recommendations, preventive care, screening tests, etc discussed and encouraged; healthy living encouraged; see AVS for patient education given to patient  Discussed importance of 150 minutes of physical activity weekly, AHA exercise recommendations given to pt in AVS/handout  Discussed importance of healthy diet:  eating lean meats and proteins, avoiding trans fats and saturated fats, avoid simple sugars and excessive carbs in diet, eat 6 servings of fruit/vegetables daily and drink plenty of water and avoid sweet beverages.  DASH diet reviewed if pt has HTN  Recommended pt to do annual eye exam and routine dental exams/cleanings  Advance Care planning information and packet discussed and offered today, encouraged pt to discuss with family members/spouse/partner/friends and complete Advanced directive packet and bring copy to office   Reviewed Health Maintenance: Health Maintenance  Topic Date Due   INFLUENZA VACCINE  08/07/2022   COVID-19 Vaccine  (3 - Moderna risk series) 08/29/2022 (Originally 05/24/2019)   DTaP/Tdap/Td (2 - Td or Tdap) 12/16/2028   Hepatitis C Screening  Completed   HIV Screening  Completed   HPV VACCINES  Aged  Out    Immunizations: Immunization History  Administered Date(s) Administered   Influenza,inj,Quad PF,6+ Mos 09/17/2018, 10/05/2019   Moderna Sars-Covid-2 Vaccination 04/05/2019, 04/26/2019   Tdap 12/17/2018   Vaccines:  HPV: up to at age 18 , ask insurance if age between 92-45  Shingrix: 22-64 yo and ask insurance if covered when patient above 28 yo Pneumonia:  educated and discussed with patient. Flu:  educated and discussed with patient. COVID:       ICD-10-CM   1. Annual physical exam  Z00.00 COMPLETE METABOLIC PANEL WITH GFR    CBC with Differential/Platelet    Lipid panel    VITAMIN D 25 Hydroxy (Vit-D Deficiency, Fractures)    2. Psychophysiological insomnia  F51.04    sleeping about 4-6 hours    3. Anxiety disorder, unspecified type  F41.9    not on meds any more, coping with anxiety, using THC to help with sx, not as severe as it was in the past    4. Vitamin D deficiency  E55.9 VITAMIN D 25 Hydroxy (Vit-D Deficiency, Fractures)   recheck labs    5. Hypercalcemia  E83.52    chronic, familial somehow    6. Thrombocytosis  D75.839    chronic    7. Hyperlipidemia, unspecified hyperlipidemia type  E78.5    not currently on meds, previously did have elevated LFTS with HLD, did improve with statin and zetia      He recently had a bad bout of vertigo which was constant and severe for about 2 weeks - he was seen at [facility] and given meclizine Not currently having sx - encouraged him to come in for eval if sx occur again    Mario Berry, PA-C 08/13/22 3:23 PM  [facility]

## 2022-08-17 DIAGNOSIS — I959 Hypotension, unspecified: Secondary | ICD-10-CM | POA: Diagnosis not present

## 2022-08-17 DIAGNOSIS — T679XXA Effect of heat and light, unspecified, initial encounter: Secondary | ICD-10-CM | POA: Diagnosis not present

## 2022-08-17 DIAGNOSIS — R42 Dizziness and giddiness: Secondary | ICD-10-CM | POA: Diagnosis not present

## 2022-08-17 DIAGNOSIS — E860 Dehydration: Secondary | ICD-10-CM | POA: Diagnosis not present

## 2022-08-18 ENCOUNTER — Telehealth (INDEPENDENT_AMBULATORY_CARE_PROVIDER_SITE_OTHER): Payer: BC Managed Care – PPO | Admitting: Family Medicine

## 2022-08-18 ENCOUNTER — Encounter: Payer: Self-pay | Admitting: Family Medicine

## 2022-08-18 DIAGNOSIS — R55 Syncope and collapse: Secondary | ICD-10-CM

## 2022-08-18 DIAGNOSIS — R42 Dizziness and giddiness: Secondary | ICD-10-CM | POA: Diagnosis not present

## 2022-08-21 ENCOUNTER — Emergency Department: Payer: BC Managed Care – PPO

## 2022-08-21 ENCOUNTER — Encounter: Payer: Self-pay | Admitting: Family Medicine

## 2022-08-21 ENCOUNTER — Other Ambulatory Visit: Payer: Self-pay

## 2022-08-21 DIAGNOSIS — R42 Dizziness and giddiness: Secondary | ICD-10-CM | POA: Insufficient documentation

## 2022-08-21 DIAGNOSIS — Z5321 Procedure and treatment not carried out due to patient leaving prior to being seen by health care provider: Secondary | ICD-10-CM | POA: Insufficient documentation

## 2022-08-21 DIAGNOSIS — R55 Syncope and collapse: Secondary | ICD-10-CM | POA: Diagnosis not present

## 2022-08-21 LAB — BASIC METABOLIC PANEL
Anion gap: 11 (ref 5–15)
BUN: 12 mg/dL (ref 6–20)
CO2: 23 mmol/L (ref 22–32)
Calcium: 9.5 mg/dL (ref 8.9–10.3)
Chloride: 104 mmol/L (ref 98–111)
Creatinine, Ser: 1.16 mg/dL (ref 0.61–1.24)
GFR, Estimated: >60 mL/min (ref >60)
Glucose, Bld: 92 mg/dL (ref 70–99)
Potassium: 3.6 mmol/L (ref 3.5–5.1)
Sodium: 138 mmol/L (ref 135–145)

## 2022-08-21 LAB — CBC
HCT: 46.5 % (ref 39.0–52.0)
Hemoglobin: 16.1 g/dL (ref 13.0–17.0)
MCH: 30.1 pg (ref 26.0–34.0)
MCHC: 34.6 g/dL (ref 30.0–36.0)
MCV: 87.1 fL (ref 80.0–100.0)
Platelets: 450 10*3/uL — ABNORMAL HIGH (ref 150–400)
RBC: 5.34 MIL/uL (ref 4.22–5.81)
RDW: 12.3 % (ref 11.5–15.5)
WBC: 10.4 10*3/uL (ref 4.0–10.5)
nRBC: 0 % (ref 0.0–0.2)

## 2022-08-21 LAB — TROPONIN I (HIGH SENSITIVITY)
Troponin I (High Sensitivity): 3 ng/L (ref <18)
Troponin I (High Sensitivity): 3 ng/L (ref <18)

## 2022-08-21 NOTE — ED Notes (Signed)
VS obtained and scheduled labs collected. Pt provided a urine specimen cup and instructed to return the cup to the triage nurse desk in the lobby once it has a clean catch urine sample.

## 2022-08-21 NOTE — ED Triage Notes (Addendum)
Pt to ED via POV from home. Pt reports 2 wks ago he passed out and was dx with vertigo. Pt reports he was at a golf tournament on Sunday and passed out twice again. Pt reports yesterday he felt dizziness but did not have LOC. Pt reports he still feels pressure in his head. Pt reports took a meclizine last pm.

## 2022-08-22 ENCOUNTER — Emergency Department
Admission: EM | Admit: 2022-08-22 | Discharge: 2022-08-22 | Discharge status: Against Medical Advice | Payer: BC Managed Care – PPO | Attending: Emergency Medicine | Admitting: Emergency Medicine

## 2022-08-22 NOTE — ED Notes (Signed)
No answer when called several times from lobby 

## 2022-08-26 ENCOUNTER — Ambulatory Visit: Payer: BC Managed Care – PPO | Attending: Family Medicine

## 2022-08-26 ENCOUNTER — Encounter: Payer: Self-pay | Admitting: Family Medicine

## 2022-08-26 ENCOUNTER — Ambulatory Visit: Payer: BC Managed Care – PPO | Admitting: Family Medicine

## 2022-08-26 VITALS — BP 122/80 | HR 82 | Temp 97.9°F | Resp 16 | Ht 69.0 in | Wt 192.2 lb

## 2022-08-26 DIAGNOSIS — Z09 Encounter for follow-up examination after completed treatment for conditions other than malignant neoplasm: Secondary | ICD-10-CM

## 2022-08-26 DIAGNOSIS — R42 Dizziness and giddiness: Secondary | ICD-10-CM

## 2022-08-26 DIAGNOSIS — R55 Syncope and collapse: Secondary | ICD-10-CM

## 2022-08-26 DIAGNOSIS — H814 Vertigo of central origin: Secondary | ICD-10-CM | POA: Diagnosis not present

## 2022-08-26 DIAGNOSIS — M542 Cervicalgia: Secondary | ICD-10-CM

## 2022-08-26 MED ORDER — SUMATRIPTAN SUCCINATE 50 MG PO TABS
ORAL_TABLET | ORAL | 1 refills | Status: AC
Start: 2022-08-26 — End: ?

## 2022-08-26 NOTE — Progress Notes (Signed)
Patient ID: Mario Boyd, male    DOB: [birth year], 37 y.o.   MRN: [id number]  PCP: Danelle Berry, PA-C  Chief Complaint  Patient presents with   Hospitalization Follow-up    vertigo    Subjective:   Mario Boyd is a 37 y.o. male, presents to clinic with CC of the following:  HPI  PT presents for ED f/up He had a subsequent episode of vertigo and went to the ER, got labs and ECG but was there waiting for hours and left prior to being seen by a provider He is here today to go over results/sx  Triage RN note: 08/21/2022  4:55 PM    Pt to ED via POV from home. Pt reports 2 wks ago he passed out and was dx with vertigo. Pt reports he was at a golf tournament on Sunday and passed out twice again. Pt reports yesterday he felt dizziness but did not have LOC. Pt reports he still feels pressure in his head. Pt reports took a meclizine last pm.      ECG done, labs - BMP, CBC, troponin x 2, CT head all reviewed and unremarkable except for known thrombocytosis  Episode happened Wednesday while at work, he got foggy and dizzy- wobbly/off balance, and felt very ill, so he went and laid on the floor at work for a while, and had to have someone help him up, he felt bad for several days.  When he felt as bad the next day he went to the ED Sx lasted several days and gradually decreased but it took about 4 full days before feeling a little better  He denies CP, palpitations, near syncope this last time. He endorses for the first time associated neck pain with all episodes - esp the first episode with multiple syncopal episodes at the golf tournament - he has a lot of neck and upper trapezius tension and pain, reduced ROM, he has tried to crack neck to get relief, seems to have some head pain/HA as well associated with the vertigo episodes - neck pain seems to intensify prior to vertigo Vertigo is worse with movement and head neck movement He stil lhas stiff neck worse pain to the left posterior  neck, worse with turning head to the left Family hx of migraines, he has never migraines before and been diagnosed  With most recent episode he tried meclizine and it didn't help much - 25 mg dose made him very sleepy 12.5 mg dose didn't do much/anything   Patient Active Problem List   Diagnosis Date Noted   Abnormal transaminases 08/04/2019   Psychophysiological insomnia 08/04/2019   Anxiety disorder 08/04/2019   Hyperlipidemia 05/24/2019   Myalgia due to statin 05/24/2019   Immunocompromised state due to drug therapy (HCC) 01/24/2019   Cervical lymphadenopathy 01/24/2019   Chronic rhinitis 01/24/2019   Class 1 obesity due to excess calories with serious comorbidity and body mass index (BMI) of 32.0 to 32.9 in adult 09/17/2018   Elevated LFTs 09/17/2018   Hypertriglyceridemia 09/17/2018   History of nephrectomy, right 09/17/2018   Hidradenitis suppurativa 09/17/2018   Thrombocytosis 09/17/2018      Current Outpatient Medications:    meclizine (ANTIVERT) 25 MG tablet, Take 25 mg by mouth 3 (three) times daily as needed., Disp: , Rfl:    No Known Allergies   Social History   Tobacco Use   Smoking status: Former    Current packs/day: 0.50    Average packs/day: 0.5 packs/day for 15.0  years (7.5 ttl pk-yrs)    Types: Cigarettes   Smokeless tobacco: Never  Vaping Use   Vaping status: Every Day   Substances: Nicotine, Flavoring  Substance Use Topics   Alcohol use: Yes    Comment: Rarely   Drug use: Never      Chart Review Today: I personally reviewed active problem list, medication list, allergies, family history, social history, health maintenance, notes from last encounter, lab results, imaging with the patient/caregiver today.   Review of Systems  Constitutional: Negative.   HENT: Negative.    Eyes: Negative.   Respiratory: Negative.    Cardiovascular: Negative.   Gastrointestinal: Negative.   Endocrine: Negative.   Genitourinary: Negative.    Musculoskeletal: Negative.   Skin: Negative.   Allergic/Immunologic: Negative.   Neurological: Negative.   Hematological: Negative.   Psychiatric/Behavioral: Negative.    All other systems reviewed and are negative.      Objective:   Vitals:   08/26/22 1325  BP: 122/80  Pulse: 82  Resp: 16  Temp: 97.9 F (36.6 C)  TempSrc: Oral  SpO2: 98%  Weight: 192 lb 3.2 oz (87.2 kg)  Height: 5\' 9"  (1.753 m)    Body mass index is 28.38 kg/m.  Physical Exam Vitals and nursing note reviewed.  Constitutional:      General: He is not in acute distress.    Appearance: Normal appearance. He is well-developed. He is not ill-appearing, toxic-appearing or diaphoretic.  HENT:     Head: Normocephalic and atraumatic.     Nose: Nose normal.  Eyes:     General:        Right eye: No discharge.        Left eye: No discharge.     Conjunctiva/sclera: Conjunctivae normal.  Neck:     Trachea: No tracheal deviation.  Cardiovascular:     Rate and Rhythm: Normal rate and regular rhythm.  Pulmonary:     Effort: Pulmonary effort is normal. No respiratory distress.     Breath sounds: No stridor.  Musculoskeletal:        General: Normal range of motion.     Cervical back: No edema, rigidity or crepitus. Pain with movement present. No spinous process tenderness. Muscular tenderness: left paraspinal msk ttp and upper left trap ttp.Normal range of motion (good ROM, slightly less rotation to the left than right but bilaterally greater than 45 degrees).  Skin:    General: Skin is warm and dry.     Findings: No rash.  Neurological:     Mental Status: He is alert.     Motor: No abnormal muscle tone.     Coordination: Coordination normal.     Comments: MENTAL STATUS: AAOx3, memory intact, fund of knowledge appropriate  LANG/SPEECH: Naming and repetition intact, fluent, no dysarthria, follows 3-step commands, answers questions appropriately    CRANIAL NERVES:   II: Pupils equal and reactive, no  RAPD   III, IV, VI: EOM intact, no gaze preference or deviation, no nystagmus.   V: normal sensation in V1, V2, and V3 segments bilaterally   VII: no asymmetry, no nasolabial fold flattening   VIII: normal hearing to speech   IX, X: normal palatal elevation, no uvular deviation   XI: 5/5 head turn and 5/5 shoulder shrug bilaterally   XII: midline tongue protrusion  MOTOR:  5/5 bilateral grip strength 5/5 strength dorsiflexion/plantarflexion b/l   SENSORY:  Normal to light touch Romberg absent  COORD: Normal finger to nose and heel to shin,  no tremor, no dysmetria  STATION: normal stance, no truncal ataxia  GAIT: Normal, normal heel-walk.    Psychiatric:        Behavior: Behavior normal.      Results for orders placed or performed during the hospital encounter of 08/22/22  Basic metabolic panel  Result Value Ref Range   Sodium 138 135 - 145 mmol/L   Potassium 3.6 3.5 - 5.1 mmol/L   Chloride 104 98 - 111 mmol/L   CO2 23 22 - 32 mmol/L   Glucose, Bld 92 70 - 99 mg/dL   BUN 12 6 - 20 mg/dL   Creatinine, Ser 1.61 0.61 - 1.24 mg/dL   Calcium 9.5 8.9 - 09.6 mg/dL   GFR, Estimated >04 >54 mL/min   Anion gap 11 5 - 15  CBC  Result Value Ref Range   WBC 10.4 4.0 - 10.5 K/uL   RBC 5.34 4.22 - 5.81 MIL/uL   Hemoglobin 16.1 13.0 - 17.0 g/dL   HCT 09.8 11.9 - 14.7 %   MCV 87.1 80.0 - 100.0 fL   MCH 30.1 26.0 - 34.0 pg   MCHC 34.6 30.0 - 36.0 g/dL   RDW 82.9 56.2 - 13.0 %   Platelets 450 (H) 150 - 400 K/uL   nRBC 0.0 0.0 - 0.2 %  Troponin I (High Sensitivity)  Result Value Ref Range   Troponin I (High Sensitivity) 3 <18 ng/L  Troponin I (High Sensitivity)  Result Value Ref Range   Troponin I (High Sensitivity) 3 <18 ng/L   Orthostatic VS for the past 24 hrs:  BP- Lying Pulse- Lying BP- Sitting Pulse- Sitting BP- Standing at 0 minutes Pulse- Standing at 0 minutes  08/26/22 1325 106/76 74 112/78 74 108/76 80      Orthostatics reviewed and NEG      Assessment &  Plan:   1. Vertigo Multiple episodes - come on without known triggers/causes, sx severe, debilitating, loss of balance, N, weak, syncope and near syncope, sx typically last several days, associated only with neck pain he now reports, no improvement with meclizine Does not seem like BPPV, in ddx consider labyrinthitis, Menire's (though I doubt), vestibular migraines possible, and now with new hx of severe neck pain/tension and cracking neck forcefully consider vertebral artery dissection Orthostatics and neuro exam normal today ED labs, CT head and ECG + serial trops were unremarkable Consider neuro consult vs neck MRA/CTA or further imaging with neuro Will try imitrex med Did again give ER precautions and encouraged him to provider the pertinenct hx of proceeding severe neck pain with 2 recent episodes  - Orthostatic vital signs - LONG TERM MONITOR (3-14 DAYS); Future - Ambulatory referral to Neurology - SUMAtriptan (IMITREX) 50 MG tablet; Take 0.5-2 tablets (25-100 mg total) by mouth at HA/vertigo episode onset. May repeat in 2 hours if headache /sx persists or recurs.  Max 200 mg dose in 24 hours  Dispense: 10 tablet; Refill: 1  2. Syncope, unspecified syncope type With golf tournament a few weeks ago, no syncope with last vertigo episode, orthostatics neg today, no reported associated palpitations, SOB/CP but can do holter to r/o arrhythmia  - Orthostatic vital signs - LONG TERM MONITOR (3-14 DAYS); Future - Ambulatory referral to Neurology  3. Encounter for examination following treatment at hospital All labs, results imaging and ECG reviewed   4. Episodic lightheadedness  - LONG TERM MONITOR (3-14 DAYS); Future - Ambulatory referral to Neurology  5. Neck pain Slightly limited rotaiton to the  left, tension and reported ttp to upper left trapezius and left cervical paraspinal msk Rotation B/L >45 degrees - he turns head very carefully because he reports it still can cause worse  vertigo with head movement or position changes Good/normal flexion/extension no recent whiplash injury, no trauma, no hx of neck pain, no recent chiropractor tx, but he does report attempting to "crack" it forcefully trying to get some relief Recent CT head reviewed and normal from ED 8/15 when very symptomatic - pt left w/o being seen by Ed provider Imaging ordered but also will work with neuro to see if they recommend different imaging for sx - Ambulatory referral to Neurology  ER precautions reviewed at length with pt Reviewed imitrex meds/dosing and how to take - recommend he take them and rest similar to if he had severe migraines (which is in family hx)    Head and neck MRI/MRA ordered    Danelle Berry, PA-C 08/26/22 1:36 PM

## 2022-08-30 ENCOUNTER — Ambulatory Visit: Payer: BC Managed Care – PPO

## 2022-09-04 NOTE — Progress Notes (Signed)
Name: Mario Boyd   MRN: [id number]    DOB: [birth year]   Date:09/04/2022       Progress Note  Subjective:    Chief Complaint  Chief Complaint  Patient presents with   Loss of Consciousness    Yesterday, I was at a golf tournament and passed out twice. Hit the ground the first time. Second time responders caught me and I came to on the ground. I'm just worried because of the recent vertigo issue that there is a blood pressure issue    I connected with  Lawernce Pitts  on 09/04/22 at  8:20 AM EDT by a video enabled telemedicine application and verified that I am speaking with the correct person using two identifiers.  I discussed the limitations of evaluation and management by telemedicine and the availability of in person appointments. The patient expressed understanding and agreed to proceed. Staff also discussed with the patient that there may be a patient responsible charge related to this service. Patient Location: home Provider Location: out of clinic office, private Additional Individuals present: none  Loss of Consciousness Pertinent negatives include no fever.   Pt had multiple syncopal/dizzy episodes, sx have resolved, he was given fluids and assessed by EMS reportedly at a golf tournament.   He is concerned about BP and other causes.  Patient Active Problem List   Diagnosis Date Noted   Abnormal transaminases 08/04/2019   Psychophysiological insomnia 08/04/2019   Anxiety disorder 08/04/2019   Hyperlipidemia 05/24/2019   Myalgia due to statin 05/24/2019   Immunocompromised state due to drug therapy (HCC) 01/24/2019   Cervical lymphadenopathy 01/24/2019   Chronic rhinitis 01/24/2019   Class 1 obesity due to excess calories with serious comorbidity and body mass index (BMI) of 32.0 to 32.9 in adult 09/17/2018   Elevated LFTs 09/17/2018   Hypertriglyceridemia 09/17/2018   History of nephrectomy, right 09/17/2018   Hidradenitis suppurativa 09/17/2018   Thrombocytosis  09/17/2018    Social History   Tobacco Use   Smoking status: Former    Current packs/day: 0.50    Average packs/day: 0.5 packs/day for 15.0 years (7.5 ttl pk-yrs)    Types: Cigarettes   Smokeless tobacco: Never  Substance Use Topics   Alcohol use: Yes    Comment: Rarely     Current Outpatient Medications:    meclizine (ANTIVERT) 25 MG tablet, Take 25 mg by mouth 3 (three) times daily as needed., Disp: , Rfl:    SUMAtriptan (IMITREX) 50 MG tablet, Take 0.5-2 tablets (25-100 mg total) by mouth at HA/vertigo episode onset. May repeat in 2 hours if headache /sx persists or recurs.  Max 200 mg dose in 24 hours, Disp: 10 tablet, Rfl: 1  No Known Allergies  I personally reviewed active problem list, medication list, allergies, family history, social history, health maintenance, notes from last encounter, lab results, imaging with the patient/caregiver today.   Review of Systems  Constitutional: Negative.  Negative for activity change, appetite change, chills, fatigue, fever and unexpected weight change.  HENT: Negative.    Eyes: Negative.   Respiratory: Negative.    Cardiovascular:  Positive for syncope.  Gastrointestinal: Negative.   Endocrine: Negative.   Genitourinary: Negative.   Musculoskeletal: Negative.   Skin: Negative.   Allergic/Immunologic: Negative.   Hematological: Negative.   Psychiatric/Behavioral: Negative.    All other systems reviewed and are negative.     Objective:   Virtual encounter, vitals limited, only able to obtain the following There were no vitals filed for  this visit. There is no height or weight on file to calculate BMI. Nursing Note and Vital Signs reviewed.  Physical Exam Vitals and nursing note reviewed.  Pulmonary:     Effort: No respiratory distress.  Neurological:     Mental Status: He is alert.  Psychiatric:        Mood and Affect: Mood normal.        Behavior: Behavior normal.     PE limited by virtual encounter  No  results found for this or any previous visit (from the past 72 hour(s)).  Assessment and Plan:     ICD-10-CM   1. Syncope and collapse  R55    at golf tournament, BP soft per pt report, improved with fluids, he will send records he has, no syncope since that day    2. Vertigo  R42    improved, pt would need in person assessment while symptomatic, difficult to say with virtual visit if peripheral vertigo, central or cardiac?      Discussed at length ER precautions and coming into clinic when symptomatic to be able to assess him He will send some picture/info/records through [facility] and I will review when received  He does have meclizine to try if vertigo happens again, he also should push fluids, try to check VS if near syncope happens  -Red flags and when to present for emergency care or RTC including chest pain, shortness of breath, new/worsening/un-resolving symptoms, reviewed with patient at time of visit. Follow up and care instructions discussed and provided in AVS. - I discussed the assessment and treatment plan with the patient. The patient was provided an opportunity to ask questions and all were answered. The patient agreed with the plan and demonstrated an understanding of the instructions.  I provided 20+ minutes of non-face-to-face time during this encounter.  Danelle Berry, PA-C 09/04/22 8:50 PM

## 2022-09-16 NOTE — Addendum Note (Signed)
Addended by: Danelle Berry on: 09/16/2022 06:46 PM   Modules accepted: Orders

## 2022-09-24 DIAGNOSIS — F411 Generalized anxiety disorder: Secondary | ICD-10-CM | POA: Diagnosis not present

## 2022-10-01 DIAGNOSIS — F330 Major depressive disorder, recurrent, mild: Secondary | ICD-10-CM | POA: Diagnosis not present

## 2022-10-15 DIAGNOSIS — F330 Major depressive disorder, recurrent, mild: Secondary | ICD-10-CM | POA: Diagnosis not present

## 2022-11-12 ENCOUNTER — Emergency Department
Admission: EM | Admit: 2022-11-12 | Discharge: 2022-11-12 | Disposition: A | Discharge status: Home / Self Care | Payer: BC Managed Care – PPO | Attending: Emergency Medicine | Admitting: Emergency Medicine

## 2022-11-12 ENCOUNTER — Encounter: Payer: Self-pay | Admitting: Emergency Medicine

## 2022-11-12 ENCOUNTER — Other Ambulatory Visit: Payer: Self-pay

## 2022-11-12 DIAGNOSIS — R569 Unspecified convulsions: Secondary | ICD-10-CM | POA: Diagnosis not present

## 2022-11-12 DIAGNOSIS — R55 Syncope and collapse: Secondary | ICD-10-CM | POA: Diagnosis not present

## 2022-11-12 DIAGNOSIS — R001 Bradycardia, unspecified: Secondary | ICD-10-CM | POA: Diagnosis not present

## 2022-11-12 DIAGNOSIS — R258 Other abnormal involuntary movements: Secondary | ICD-10-CM | POA: Insufficient documentation

## 2022-11-12 DIAGNOSIS — R61 Generalized hyperhidrosis: Secondary | ICD-10-CM | POA: Diagnosis not present

## 2022-11-12 DIAGNOSIS — G4489 Other headache syndrome: Secondary | ICD-10-CM | POA: Diagnosis not present

## 2022-11-12 DIAGNOSIS — R4020 Unspecified coma: Secondary | ICD-10-CM

## 2022-11-12 LAB — URINALYSIS, ROUTINE W REFLEX MICROSCOPIC
Bilirubin Urine: NEGATIVE
Glucose, UA: NEGATIVE mg/dL
Hgb urine dipstick: NEGATIVE
Ketones, ur: NEGATIVE mg/dL
Leukocytes,Ua: NEGATIVE
Nitrite: NEGATIVE
Protein, ur: 100 mg/dL — AB
Specific Gravity, Urine: 1.025 (ref 1.005–1.030)
Squamous Epithelial / HPF: 0 /[HPF] (ref 0–5)
pH: 5 (ref 5.0–8.0)

## 2022-11-12 LAB — BASIC METABOLIC PANEL
Anion gap: 10 (ref 5–15)
BUN: 16 mg/dL (ref 6–20)
CO2: 21 mmol/L — ABNORMAL LOW (ref 22–32)
Calcium: 8.9 mg/dL (ref 8.9–10.3)
Chloride: 104 mmol/L (ref 98–111)
Creatinine, Ser: 0.99 mg/dL (ref 0.61–1.24)
GFR, Estimated: >60 mL/min (ref >60)
Glucose, Bld: 98 mg/dL (ref 70–99)
Potassium: 3.5 mmol/L (ref 3.5–5.1)
Sodium: 135 mmol/L (ref 135–145)

## 2022-11-12 LAB — CBC
HCT: 45.9 % (ref 39.0–52.0)
Hemoglobin: 15.9 g/dL (ref 13.0–17.0)
MCH: 30.6 pg (ref 26.0–34.0)
MCHC: 34.6 g/dL (ref 30.0–36.0)
MCV: 88.3 fL (ref 80.0–100.0)
Platelets: 439 10*3/uL — ABNORMAL HIGH (ref 150–400)
RBC: 5.2 MIL/uL (ref 4.22–5.81)
RDW: 12.2 % (ref 11.5–15.5)
WBC: 10.8 10*3/uL — ABNORMAL HIGH (ref 4.0–10.5)
nRBC: 0 % (ref 0.0–0.2)

## 2022-11-12 NOTE — Discharge Instructions (Signed)
I have given you referrals for cardiology and neurology for further evaluation outpatient.  Please expect their call to set up this appointment.  Otherwise, follow-up with your primary care provider for reassessment and ongoing workup.

## 2022-11-12 NOTE — ED Triage Notes (Signed)
Pt BIB EMS for multiple witnessed syncopal episodes while at work. No injury. Pt states he just feels lightheaded. Pt complains of shoulder and neck pain.

## 2022-11-12 NOTE — ED Notes (Signed)
Patient provided with gatorade for oral rehydration. Snacks at bedside.   Patient ambulatory in room with HR 70-102. Denies any lightheadedness/dizziness while up moving around.   Placed back in bed with 1 rail up and call bell in reach.

## 2022-11-12 NOTE — ED Provider Notes (Signed)
[facility] Provider Note    Event Date/Time   First MD Initiated Contact with Patient 11/12/22 1955     (approximate)   History   Loss of Consciousness   HPI Mario Boyd is a 37 y.o. male with history of HLD presenting today for loss of consciousness.  Patient states he was at work today when he had 2 episodes of loss of consciousness.  He noticed sweatiness and lightheadedness right before it happened.  He was seated and did not fall.  He was lowered to the ground by work members.  Coworker indicated he may have had seizure-like activity.  Patient states he is not sure if he was confused when he woke up.  Otherwise no tongue biting or urination during the process.  Denied palpitations, chest pain, nausea, vomiting, abdominal pain.  Currently feeling back to baseline with no acute symptoms.  Reviewed chart notes: Patient has had recent evaluations for syncope as well as vertigo over the past 4 months.     Physical Exam   Triage Vital Signs: ED Triage Vitals  Encounter Vitals Group     BP 11/12/22 1805 (!) 133/100     Systolic BP Percentile --      Diastolic BP Percentile --      Pulse Rate 11/12/22 1805 68     Resp 11/12/22 1805 18     Temp 11/12/22 1805 98.4 F (36.9 C)     Temp Source 11/12/22 1805 Oral     SpO2 11/12/22 1805 98 %     Weight 11/12/22 1806 185 lb (83.9 kg)     Height 11/12/22 1806 5\' 9"  (1.753 m)     Head Circumference --      Peak Flow --      Pain Score 11/12/22 1806 4     Pain Loc --      Pain Education --      Exclude from Growth Chart --     Most recent vital signs: Vitals:   11/12/22 1805 11/12/22 2030  BP: (!) 133/100   Pulse: 68   Resp: 18 13  Temp: 98.4 F (36.9 C)   SpO2: 98%    Physical Exam: I have reviewed the vital signs and nursing notes. General: Awake, alert, no acute distress.  Nontoxic appearing. Head:  Atraumatic, normocephalic.   ENT:  EOM intact, PERRL. Oral mucosa is pink and moist with no  lesions. Neck: Neck is supple with full range of motion, No meningeal signs. Cardiovascular:  RRR, No murmurs. Peripheral pulses palpable and equal bilaterally. Respiratory:  Symmetrical chest wall expansion.  No rhonchi, rales, or wheezes.  Good air movement throughout.  No use of accessory muscles.   Musculoskeletal:  No cyanosis or edema. Moving extremities with full ROM Abdomen:  Soft, nontender, nondistended. Neuro:  GCS 15, moving all four extremities, interacting appropriately. Speech clear. Psych:  Calm, appropriate.   Skin:  Warm, dry, no rash.    ED Results / Procedures / Treatments   Labs (all labs ordered are listed, but only abnormal results are displayed) Labs Reviewed  BASIC METABOLIC PANEL - Abnormal; Notable for the following components:      Result Value   CO2 21 (*)    All other components within normal limits  CBC - Abnormal; Notable for the following components:   WBC 10.8 (*)    Platelets 439 (*)    All other components within normal limits  URINALYSIS, ROUTINE W REFLEX MICROSCOPIC - Abnormal;  Notable for the following components:   Color, Urine YELLOW (*)    APPearance HAZY (*)    Protein, ur 100 (*)    Bacteria, UA RARE (*)    All other components within normal limits  CBG MONITORING, ED     EKG My EKG interpretation: Rate of 78, normal sinus rhythm, normal axis.  Normal intervals.  No acute ST elevations or depressions   RADIOLOGY    PROCEDURES:  Critical Care performed: No  Procedures   MEDICATIONS ORDERED IN ED: Medications - No data to display   IMPRESSION / MDM / ASSESSMENT AND PLAN / ED COURSE  I reviewed the triage vital signs and the nursing notes.                              Differential diagnosis includes, but is not limited to, seizure, orthostatic hypotension, less likely cardiac arrhythmia, vasovagal syncope  Patient's presentation is most consistent with acute complicated illness / injury requiring diagnostic  workup.  Patient is a 37 year old male presenting today for episode of loss of consciousness.  Unclear if this was true syncope versus potential seizure-like activity.  Otherwise, patient did not have obvious postictal period but stated he may have been slightly confused for a couple seconds.  On arrival to the ED, patient is back to baseline.  Vital signs are normal.  Physical exam is unremarkable.  No acute complaints at this time.  EKG reassuring at this time.  Laboratory workup unremarkable other than slightly low bicarb and mildly elevated WBC but no obvious infectious symptoms.  Patient has been evaluated by this by his primary care provider outpatient and originally suspected vertigo type symptoms.  He did have a Holter monitor with no acute pathology.  Patient notes that he had seizure history when he was a kid but grew out of that and is not currently on medication nor was on it in the past.  Not specifically clear if this is seizure or syncope at this time.  However, I do think he warrants an outpatient follow-up with neurology for further assessment given seizure history as a kid.  Separately, I also recommended a referral to cardiology to evaluate with echo or ultrasounds of his carotid arteries to rule out a cardiovascular cause of syncope.  Patient otherwise feels comfortable and does wish to go home at this time and not stay in the hospital for further evaluation.  With him back to his baseline I feel safe with this but he was given strict return precautions for worsening symptoms.  The patient is on the cardiac monitor to evaluate for evidence of arrhythmia and/or significant heart rate changes.     FINAL CLINICAL IMPRESSION(S) / ED DIAGNOSES   Final diagnoses:  Loss of consciousness (HCC)  Seizure-like activity (HCC)     Rx / DC Orders   ED Discharge Orders          Ordered    Ambulatory referral to Cardiology       Comments: If you have not heard from the Cardiology office  within the next 72 hours please call [phone].   11/12/22 2040    Ambulatory referral to Neurology       Comments: An appointment is requested in approximately: 1 week   11/12/22 2040             Note:  This document was prepared using Dragon voice recognition software and may include unintentional  dictation errors.   Janith Lima, MD 11/12/22 2117

## 2022-11-26 ENCOUNTER — Ambulatory Visit
Admission: RE | Admit: 2022-11-26 | Discharge: 2022-11-26 | Disposition: A | Discharge status: Home / Self Care | Payer: BC Managed Care – PPO | Attending: Physician Assistant | Admitting: Physician Assistant

## 2022-11-26 ENCOUNTER — Ambulatory Visit
Admission: RE | Admit: 2022-11-26 | Discharge: 2022-11-26 | Disposition: A | Discharge status: Home / Self Care | Payer: BC Managed Care – PPO | Source: Ambulatory Visit | Attending: Physician Assistant

## 2022-11-26 ENCOUNTER — Encounter: Payer: Self-pay | Admitting: Physician Assistant

## 2022-11-26 ENCOUNTER — Ambulatory Visit: Payer: BC Managed Care – PPO | Admitting: Physician Assistant

## 2022-11-26 ENCOUNTER — Telehealth: Payer: Self-pay

## 2022-11-26 VITALS — BP 130/74 | HR 92 | Temp 98.5°F | Resp 16 | Ht 69.0 in | Wt 187.7 lb

## 2022-11-26 DIAGNOSIS — M47812 Spondylosis without myelopathy or radiculopathy, cervical region: Secondary | ICD-10-CM | POA: Diagnosis not present

## 2022-11-26 DIAGNOSIS — M542 Cervicalgia: Secondary | ICD-10-CM

## 2022-11-26 DIAGNOSIS — R42 Dizziness and giddiness: Secondary | ICD-10-CM | POA: Diagnosis not present

## 2022-11-26 DIAGNOSIS — M5030 Other cervical disc degeneration, unspecified cervical region: Secondary | ICD-10-CM | POA: Diagnosis not present

## 2022-11-26 DIAGNOSIS — M4802 Spinal stenosis, cervical region: Secondary | ICD-10-CM | POA: Diagnosis not present

## 2022-11-26 DIAGNOSIS — F411 Generalized anxiety disorder: Secondary | ICD-10-CM

## 2022-11-26 DIAGNOSIS — R55 Syncope and collapse: Secondary | ICD-10-CM | POA: Diagnosis not present

## 2022-11-26 MED ORDER — HYDROXYZINE PAMOATE 25 MG PO CAPS: 25.0000 mg | ORAL_CAPSULE | Freq: Three times a day (TID) | ORAL | 0 refills | Status: DC | PRN

## 2022-11-26 MED ORDER — MELOXICAM 7.5 MG PO TABS: 7.5000 mg | ORAL_TABLET | Freq: Every day | ORAL | 0 refills | Status: DC

## 2022-11-26 NOTE — Patient Instructions (Signed)
  I recommend the following at this time to help relieve that discomfort:  Rest Warm compresses to the area (20 minutes on, minimum of 30 minutes off) You can alternate Tylenol and Ibuprofen for pain management but Ibuprofen is typically preferred to reduce inflammation.  I have sent in a script for Meloxicam 7.5 mg to be taken by mouth once per day - do not take other NSAIDs while using this  Gentle stretches and exercises that I have included in your paperwork Try to reduce excess strain to the area and rest as much as possible    Please keep your upcoming apts with Neurology and Cardiology as scheduled  If you have another instance of passing out or seizure-like activity please go to the ED immediately  Make sure you are eating regularly and staying hydrated with at least 75 oz of water per day    If these measures do not lead to improvement in your symptoms over the next 2-4 weeks please let us know

## 2022-11-26 NOTE — Progress Notes (Unsigned)
Acute Office Visit   Patient: Mario Boyd   DOB: 11/16/1985   37 y.o. Male  MRN: [id number] Visit Date: 11/26/2022  Today's healthcare provider: Oswaldo Conroy Tajia Szeliga, PA-C  Introduced myself to the patient as a Secondary school teacher and provided education on APPs in clinical practice.    Chief Complaint  Patient presents with   Headache    Pt believes has migraines, onset for 2 weeks starts in the back radiates all over. Feels a lot of pressure. Pt had a seizure before these headaches.   Emesis   Subjective    HPI HPI     Headache    Additional comments: Pt believes has migraines, onset for 2 weeks starts in the back radiates all over. Feels a lot of pressure. Pt had a seizure before these headaches.      Last edited by Forde Radon, CMA on 11/26/2022 11:24 AM.        He reports he has had ongoing headache and neck pain, left shoulder pain  Reports as soon as he has to focus on something in the distance or goes outside in bright light he starts to get headache and feel nauseous   Reviewed ED visit notes - he reports on this occasion he had LOC and seizure-like activity  He denies hitting head based on witness accounts   Every time he gets warm he feels like he gets tunnel vision and has pre-syncope symptoms along with nausea   He has apt with Neurology in Dec  He is especially concerned right now as he is having trouble doing his work and is afraid to drive due to concerns for syncope    Interventions: he has been taking Tylenol, Ibuprofen, Advil to assist with pain  He is not getting 3 full meals per day- he does snack during work. Sounds like he splits a meal into multiple servings throughout work shift   He still has long-term cardiac monitor at home but has not applied it yet     11/26/2022   11:18 AM 08/26/2022    1:24 PM 08/18/2022    8:11 AM 08/13/2022    3:04 PM 07/19/2021    3:08 PM  Depression screen PHQ 2/9  Decreased Interest 0 0 0 0 0  Down, Depressed,  Hopeless 0 0 0 0 0  PHQ - 2 Score 0 0 0 0 0  Altered sleeping 0 0 0 0 0  Tired, decreased energy 0 0 0 0 0  Change in appetite 0 0 0 0 0  Feeling bad or failure about yourself  0 0 0 0 0  Trouble concentrating 0 0 0 0 0  Moving slowly or fidgety/restless 0 0 0 0 0  Suicidal thoughts 0 0 0 0 0  PHQ-9 Score 0 0 0 0 0  Difficult doing work/chores Not difficult at all Not difficult at all Not difficult at all Not difficult at all Not difficult at all       08/26/2022    1:25 PM 07/11/2021    2:16 PM 02/07/2021    9:38 AM 11/08/2020    2:22 PM  GAD 7 : Generalized Anxiety Score  Nervous, Anxious, on Edge 3 0 0 1  Control/stop worrying 3 0 0 0  Worry too much - different things 3 0 0 0  Trouble relaxing 3 0 0 0  Restless 3 0 0 1  Easily annoyed or irritable 0 0 0 0  Afraid -  awful might happen 0 0 0 0  Total GAD 7 Score 15 0 0 2  Anxiety Difficulty Very difficult Not difficult at all Not difficult at all Not difficult at all     Medications: Outpatient Medications Prior to Visit  Medication Sig   meclizine (ANTIVERT) 25 MG tablet Take 25 mg by mouth 3 (three) times daily as needed.   SUMAtriptan (IMITREX) 50 MG tablet Take 0.5-2 tablets (25-100 mg total) by mouth at HA/vertigo episode onset. May repeat in 2 hours if headache /sx persists or recurs.  Max 200 mg dose in 24 hours   No facility-administered medications prior to visit.    Review of Systems  Constitutional:  Positive for diaphoresis. Negative for chills and fever.  Respiratory:  Negative for shortness of breath and wheezing.   Cardiovascular:  Negative for chest pain, palpitations and leg swelling.  Gastrointestinal:  Positive for diarrhea, nausea and vomiting.  Genitourinary:  Negative for dysuria.  Musculoskeletal:  Positive for back pain, myalgias and neck pain.  Skin:  Negative for rash.  Neurological:  Positive for dizziness, seizures, syncope, light-headedness and headaches. Negative for tremors, facial  asymmetry, weakness and numbness.        Objective    BP 130/74   Pulse 92   Temp 98.5 F (36.9 C) (Oral)   Resp 16   Ht 5\' 9"  (1.753 m)   Wt 187 lb 11.2 oz (85.1 kg)   SpO2 98%   BMI 27.72 kg/m     Physical Exam Vitals reviewed.  Constitutional:      General: He is awake.     Appearance: Normal appearance. He is well-developed and well-groomed.  HENT:     Head: Normocephalic and atraumatic.  Pulmonary:     Effort: Pulmonary effort is normal.     Breath sounds: Normal breath sounds. No decreased air movement. No decreased breath sounds, wheezing, rhonchi or rales.  Neurological:     General: No focal deficit present.     Mental Status: He is alert and oriented to person, place, and time.     GCS: GCS eye subscore is 4. GCS verbal subscore is 5. GCS motor subscore is 6.     Comments: Comments: MENTAL STATUS: AAOx3, memory intact, fund of knowledge appropriate   LANG/SPEECH: Naming and repetition intact, fluent, no dysarthria, follows 3-step commands, answers questions appropriately     CRANIAL NERVES:   II: Pupils equal and reactive, no RAPD   III, IV, VI: EOM intact, no gaze preference or deviation, no nystagmus.   V: normal sensation in V1, V2, and V3 segments bilaterally   VII: no asymmetry, no nasolabial fold flattening   VIII: normal hearing to speech   IX, X: normal palatal elevation, no uvular deviation   XI: 5/5 head turn and 5/5 shoulder shrug bilaterally   XII: midline tongue protrusion   MOTOR:  5/5 bilateral grip strength 5/5 strength dorsiflexion/plantarflexion b/l   COORD: Normal finger to nose and heel to shin, no tremor, no dysmetria   STATION: normal stance, no truncal ataxia   GAIT: Normal; patient able to tip-toe, heel-walk.   Psychiatric:        Behavior: Behavior is cooperative.       No results found for any visits on 11/26/22.  Assessment & Plan      No follow-ups on file.     Problem List Items Addressed This Visit        Other   Anxiety disorder  Previous hx of anxiety- appears exacerbated by current vertigo and syncope symptoms  Will provide Hydroxyzine 25 mg PO TID PRN to assist with symptoms for now May need maintenance if persistent  Follow up as needed for persistent or progressing symptoms        Relevant Medications   hydrOXYzine (VISTARIL) 25 MG capsule   Vertigo    Unsure of chronicity but ongoing and may be connected to neck pain and syncopal events PE is overall normal- cannot rule out eustachian tube dysfunction or inner ear complication He has apt with Neurology coming up  May need referral to Vestibular rehab for persistent symptoms if labs are inconclusive Follow up as needed for persistent or progressing symptoms        Syncope - Primary    Acute, single episode on 11/12/22 - reviewed ED visit notes, labs, imaging results He reports witnessed seizure-like activity after LOC  He has apt with Neurology later in Dec for evaluation Will check CMP, CBC, thiamine, B12, folate, TSH, for rule out Results to dictate further management  Recommend he stays well hydrated and eats regular meals and snacks throughout the day  Recommend he uses long-term heart monitor to assist with work up  [employer] rule out potential POTS given tachycardia, palpitations and LOC  Will discuss further after neurology apt       Relevant Orders   COMPLETE METABOLIC PANEL WITH GFR   CBC w/Diff/Platelet   Vitamin B1   B12 and Folate Panel   TSH   T4   Other Visit Diagnoses     Neck pain     Acute, ongoing since syncopal event earlier in the month Will get cervical imaging to rule out osseous injury  Suspect more muscular etiology at this time - potential torticollis? For now will try treating with Meloxicam 7.5 mg PO every day and Tylenol, warm compresses, gentle massage Imaging results to dictate further management Follow up as needed for persistent or progressing symptoms     Relevant Medications    meloxicam (MOBIC) 7.5 MG tablet   Other Relevant Orders   DG Cervical Spine Complete         No follow-ups on file.   I, Sharyon Peitz E Marquisa Salih, PA-C, have reviewed all documentation for this visit. The documentation on 11/27/22 for the exam, diagnosis, procedures, and orders are all accurate and complete.   Jacquelin Hawking, MHS, PA-C [facility]

## 2022-11-26 NOTE — Telephone Encounter (Signed)
Transition Care Management Follow-up Telephone Call Date of discharge and from where: 11/12/2022 [facility] How have you been since you were released from the hospital? Patient stated he is feeling better. Any questions or concerns? No  Items Reviewed: Did the pt receive and understand the discharge instructions provided? Yes  Medications obtained and verified?  No medication prescribed this visit. Patient has no difficulty obtaining medication. Other? No  Any new allergies since your discharge? No  Dietary orders reviewed? Yes Do you have support at home? Yes   Follow up appointments reviewed:  PCP Hospital f/u appt confirmed? Yes  Scheduled to see Providence Crosby, PA-C on 11/26/2022 @ [facility] f/u appt confirmed? Yes  Scheduled to see Vikram R. Penumalli, MD on 01/05/2023 @ [facility]. Are transportation arrangements needed? No  If their condition worsens, is the pt aware to call PCP or go to the Emergency Dept.? Yes Was the patient provided with contact information for the PCP's office or ED? Yes Was to pt encouraged to call back with questions or concerns? Yes   Mario Boyd [facility]  [facility] Guide Direct Dial: [phone]  Website: [email or web address]

## 2022-11-27 DIAGNOSIS — R42 Dizziness and giddiness: Secondary | ICD-10-CM | POA: Insufficient documentation

## 2022-11-27 DIAGNOSIS — R55 Syncope and collapse: Secondary | ICD-10-CM | POA: Insufficient documentation

## 2022-11-27 NOTE — Assessment & Plan Note (Signed)
Previous hx of anxiety- appears exacerbated by current vertigo and syncope symptoms  Will provide Hydroxyzine 25 mg PO TID PRN to assist with symptoms for now May need maintenance if persistent  Follow up as needed for persistent or progressing symptoms

## 2022-11-27 NOTE — Assessment & Plan Note (Addendum)
Acute, single episode on 11/12/22 - reviewed ED visit notes, labs, imaging results He reports witnessed seizure-like activity after LOC  He has apt with Neurology later in Dec for evaluation Will check CMP, CBC, thiamine, B12, folate, TSH, for rule out Results to dictate further management  Recommend he stays well hydrated and eats regular meals and snacks throughout the day  Recommend he uses long-term heart monitor to assist with work up  [employer] rule out potential POTS given tachycardia, palpitations and LOC  Will discuss further after neurology apt

## 2022-11-27 NOTE — Assessment & Plan Note (Signed)
Unsure of chronicity but ongoing and may be connected to neck pain and syncopal events PE is overall normal- cannot rule out eustachian tube dysfunction or inner ear complication He has apt with Neurology coming up  May need referral to Vestibular rehab for persistent symptoms if labs are inconclusive Follow up as needed for persistent or progressing symptoms

## 2022-12-10 NOTE — Progress Notes (Signed)
Your xray shows some degenerative disc disease (arthritis) but no acute injuries or fractures. This is usually managed with pain relievers, stretches and exercises to reduce inflammation.

## 2022-12-15 ENCOUNTER — Ambulatory Visit: Payer: BC Managed Care – PPO

## 2022-12-15 ENCOUNTER — Ambulatory Visit: Payer: BC Managed Care – PPO | Attending: Nurse Practitioner | Admitting: Nurse Practitioner

## 2022-12-15 ENCOUNTER — Encounter: Payer: Self-pay | Admitting: Nurse Practitioner

## 2022-12-15 VITALS — BP 124/81 | HR 75 | Ht 69.0 in | Wt 194.2 lb

## 2022-12-15 DIAGNOSIS — Z720 Tobacco use: Secondary | ICD-10-CM | POA: Diagnosis not present

## 2022-12-15 DIAGNOSIS — F1210 Cannabis abuse, uncomplicated: Secondary | ICD-10-CM

## 2022-12-15 DIAGNOSIS — R55 Syncope and collapse: Secondary | ICD-10-CM | POA: Diagnosis not present

## 2022-12-15 NOTE — Progress Notes (Signed)
Cardiology Clinic Note   Patient Name: Mario Boyd Date of Encounter: 12/15/2022  Primary Care Provider:  Danelle Berry, PA-C Primary Cardiologist:  None - new  Patient Profile    37 y.o. male with a history of presyncope and syncope, hyperlipidemia with statin intolerance, hypertriglyceridemia, LFT abnormalities with subsequent normalization, and anxiety, who presents for follow-up related to syncope.  Past Medical History    Past Medical History:  Diagnosis Date   Asthma    childhood   Hidradenitis suppurativa    Hyperlipidemia    Migraines    Seizures (HCC)    a. as a child - last age ~ 45   Vasovagal syncope    a. as child up to age 34 - usually w/ activity.   Past Surgical History:  Procedure Laterality Date   HERNIA REPAIR     KIDNEY SURGERY     NEPHRECTOMY LIVING DONOR Right 2009   TONSILLECTOMY     TYMPANOSTOMY TUBE PLACEMENT      Allergies  No Known Allergies  History of Present Illness      37 y.o. y/o male with a history of presyncope and syncope, hyperlipidemia with statin intolerance, hypertriglyceridemia, migraines, LFT abnormalities with subsequent normalization, and anxiety.  Patient was locally with his wife and 2 children.  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  He does not routinely exercise outside of work but is active in and around his home and with his children without symptoms or limitations.  He previously smoked cigarettes for about 15 years and has been vaping over the past several years.  He works night shift and smokes marijuana when he comes home from work in the morning, to help him fall asleep.  As a child, he was diagnosed with seizures which she says he subsequently "outgrew."  He was also diagnosed with vasovagal syncope, that typically happened with exercise and sports.  He thinks that his last bout of vasovagal syncope as a child was around age 51, when he was in high school and playing basketball.     Mr. Melhorn did well throughout his early adulthood without any recurrence of vasovagal presyncope or syncope.  However, in August 2024, he was at a golf event with his family.  He notes that it was very hot that day and he is not sure that he was hydrating appropriately.  He and his family were eating quite a bit.  He noted some GI distress and stood to go to the bathroom, but after taking just a few steps, he felt very lightheaded and then lost consciousness, falling forward into a group of garbage cans.  He believes he was without consciousness for just a few seconds.  He was attended to by family and onlookers and subsequently evaluated in the emergency department.  Here, ECG was unremarkable.  Creatinine was mildly elevated at 1.16 and troponins were normal.  Head CT showed no acute findings.  He was discharged home and followed up with his primary care provider.  A ZIO monitor was ordered however, patient was feeling better when he received in the mail and never placed it.  He did well until early November, when he was at work and again had an episode of feeling very hot and lightheaded.  He also noted trapezius and neck pain.  He walked into a meeting and upon sitting, he felt he would lose consciousness, and slumped forward onto the table while still sitting in  the chair.  He was attended to by coworkers and regained consciousness quickly but then stood to walk out with his employer and lost consciousness and fell to the floor.  He reports that his boss told him he could not initially find a pulse but he believes he was without consciousness for only a few seconds.  He started feeling better while lying on the floor but upon sitting up and leaning against a wall, he again had significant lightheadedness and near loss of consciousness.  He was again taken to the emergency department where ECG and blood work was relatively unremarkable with exception of mild leukocytosis at 10.8.  He felt back to baseline  with IV fluids and was discharged home with cardiology and neurology referrals.  He denies any prior history of chest pain or dyspnea.  He does experience nausea in the setting of presyncope and following syncopal spells but has not had any vomiting.  He denies palpitations, PND, orthopnea, edema, or early satiety. Objective  Home Medications    Current Outpatient Medications  Medication Sig Dispense Refill   hydrOXYzine (VISTARIL) 25 MG capsule Take 1 capsule (25 mg total) by mouth every 8 (eight) hours as needed. 30 capsule 0   meclizine (ANTIVERT) 25 MG tablet Take 25 mg by mouth 3 (three) times daily as needed.     meloxicam (MOBIC) 7.5 MG tablet Take 1 tablet (7.5 mg total) by mouth daily. 30 tablet 0   SUMAtriptan (IMITREX) 50 MG tablet Take 0.5-2 tablets (25-100 mg total) by mouth at HA/vertigo episode onset. May repeat in 2 hours if headache /sx persists or recurs.  Max 200 mg dose in 24 hours 10 tablet 1   No current facility-administered medications for this visit.     Family History    Family History  Problem Relation Age of Onset   Hyperlipidemia Mother    Hypertension Mother    Other Mother        thrombocytosis   Hyperlipidemia Father    Hypertension Father    Epilepsy Sister    Hypertension Sister    Epilepsy Son    Heart failure Maternal Grandmother    Diabetes Maternal Grandfather    Heart failure Maternal Grandfather    Other Maternal Grandfather        thrombocytosis   Diabetes Paternal Grandmother    Hypertension Paternal Grandfather    Prostate cancer Paternal Grandfather    He indicated that his mother is alive. He indicated that his father is alive. He indicated that his sister is alive. He indicated that his brother is alive. He indicated that his maternal grandmother is deceased. He indicated that his maternal grandfather is deceased. He indicated that his paternal grandmother is alive. He indicated that his paternal grandfather is alive. He indicated  that his daughter is alive. He indicated that his son is alive.   Social History    Social History   Socioeconomic History   Marital status: Married    Spouse name: alice   Number of children: 2   Years of education: 12   Highest education level: Associate degree: occupational, Scientist, product/process development, or vocational program  Occupational History   Occupation: unemployed  Tobacco Use   Smoking status: Former    Current packs/day: 0.50    Average packs/day: 0.5 packs/day for 15.0 years (7.5 ttl pk-yrs)    Types: Cigarettes   Smokeless tobacco: Never  Vaping Use   Vaping status: Every Day   Substances: Nicotine, Flavoring  Substance and  Sexual Activity   Alcohol use: Yes    Comment: Rarely - 1 drink < 1x/mo   Drug use: Yes    Types: Marijuana    Comment: every morning (works nights, has a blunt prior to sleep in the AM)   Sexual activity: Yes    Comment: wife on birth control, 9 years  Other Topics Concern   Not on file  Social History Narrative   Vape; quit smoking; no alcohol; works at [employer]; in [place]/wife;and 2 children.    Social Determinants of Health   Financial Resource Strain: Low Risk  (08/13/2022)   Overall Financial Resource Strain (CARDIA)    Difficulty of Paying Living Expenses: Not hard at all  Food Insecurity: No Food Insecurity (08/13/2022)   Hunger Vital Sign    Worried About Running Out of Food in the Last Year: Never true    Ran Out of Food in the Last Year: Never true  Transportation Needs: No Transportation Needs (08/13/2022)   PRAPARE - Administrator, Civil Service (Medical): No    Lack of Transportation (Non-Medical): No  Physical Activity: Inactive (08/13/2022)   Exercise Vital Sign    Days of Exercise per Week: 0 days    Minutes of Exercise per Session: 0 min  Stress: Stress Concern Present (08/13/2022)   [employer] of Occupational Health - Occupational Stress Questionnaire    Feeling of Stress : Very much  Social Connections: Moderately  Integrated (08/13/2022)   Social Connection and Isolation Panel [NHANES]    Frequency of Communication with Friends and Family: More than three times a week    Frequency of Social Gatherings with Friends and Family: More than three times a week    Attends Religious Services: More than 4 times per year    Active Member of [employer] or Organizations: No    Attends Banker Meetings: Never    Marital Status: Married  Catering manager Violence: Not At Risk (08/13/2022)   Humiliation, Afraid, Rape, and Kick questionnaire    Fear of Current or Ex-Partner: No    Emotionally Abused: No    Physically Abused: No    Sexually Abused: No     Review of Systems    General:  No chills, fever, night sweats or weight changes.  Cardiovascular:  No chest pain, dyspnea on exertion, edema, orthopnea, palpitations, paroxysmal nocturnal dyspnea. Dermatological: No rash, lesions/masses Respiratory: No cough, dyspnea Urologic: No hematuria, dysuria Abdominal:   No nausea, vomiting, diarrhea, bright red blood per rectum, melena, or hematemesis Neurologic:  No visual changes, wkns, changes in mental status. All other systems reviewed and are otherwise negative except as noted above.     Physical Exam    VS:  BP 124/81 (BP Location: Left Arm, Patient Position: Sitting, Cuff Size: Normal)   Pulse 75   Ht 5\' 9"  (1.753 m)   Wt 194 lb 3.2 oz (88.1 kg)   SpO2 98%   BMI 28.68 kg/m  , BMI Body mass index is 28.68 kg/m.    Orthostatic VS for the past 24 hrs:  BP- Lying Pulse- Lying BP- Sitting Pulse- Sitting BP- Standing at 0 minutes Pulse- Standing at 0 minutes  12/15/22 0957 119/78 74 122/80 84 122/82 77     GEN: Well nourished, well developed, in no acute distress. HEENT: normal. Neck: Supple, no JVD, carotid bruits, or masses. Cardiac: RRR, no murmurs, rubs, or gallops. No clubbing, cyanosis, edema.  Radials 2+/PT 2+ and equal bilaterally.  Respiratory:  Respirations regular and unlabored, clear  to auscultation bilaterally. GI: Soft, nontender, nondistended, BS + x 4. MS: no deformity or atrophy. Skin: warm and dry, no rash. Neuro:  Strength and sensation are intact. Psych: Normal affect.  Accessory Clinical Findings    ECG personally reviewed by me today- EKG Interpretation Date/Time:  Monday December 15 2022 09:54:15 EST Ventricular Rate:  75 PR Interval:  160 QRS Duration:  100 QT Interval:  346 QTC Calculation: 386 R Axis:   62  Text Interpretation: Normal sinus rhythm, early repolarization Confirmed by Nicolasa Ducking [phone]) on 12/15/2022 10:00:06 AM   - No acute changes  Lab Results  Component Value Date   WBC 10.8 (H) 11/12/2022   HGB 15.9 11/12/2022   HCT 45.9 11/12/2022   MCV 88.3 11/12/2022   PLT 439 (H) 11/12/2022   Lab Results  Component Value Date   CREATININE 0.99 11/12/2022   BUN 16 11/12/2022   NA 135 11/12/2022   K 3.5 11/12/2022   CL 104 11/12/2022   CO2 21 (L) 11/12/2022   Lab Results  Component Value Date   ALT 45 08/13/2022   AST 21 08/13/2022   ALKPHOS 76 05/13/2021   BILITOT 0.5 08/13/2022   Lab Results  Component Value Date   CHOL 209 (H) 08/13/2022   HDL 46 08/13/2022   LDLCALC 128 (H) 08/13/2022   TRIG 213 (H) 08/13/2022   CHOLHDL 4.5 08/13/2022    Lab Results  Component Value Date   HGBA1C 5.5 09/06/2018      Assessment & Plan   1.  Presyncope/syncope: Patient with a history of vasovagal syncope as a child, but no events since about age 51.  Over the summer, he had a syncopal spell in the setting of a very warm day where he was not sure if he was hydrating effectively.  ER evaluation showed mild creatinine elevation, and symptoms improved with IV fluids.  ER eval was otherwise unremarkable.  He had recurrent episode of presyncope and syncope in November, when he developed severe trapezius and neck pain at work followed by feeling very hot and lightheaded.  He lost consciousness 3 times while either sitting up or  standing, and this was witnessed by his coworkers.  He was without consciousness for only a few seconds at a time.  ER workup was again unremarkable.  He has not any recurrent symptoms.  He denies any prior history of chest pain, dyspnea, or palpitations.  He is not orthostatic by examination today.  I will arrange for a 2D echocardiogram and 2-week ZIO monitor with live telemetry to rule out arrhythmia, and plan to follow-up in 4 to 6 weeks.  2.  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  Brief cessation counseling provided.  3.  Disposition: Follow-up echo in [place].  Follow-up in clinic in 6 weeks or sooner if necessary.  Nicolasa Ducking, NP 12/15/2022, 12:54 PM

## 2022-12-15 NOTE — Patient Instructions (Signed)
Medication Instructions:  - No changes *If you need a refill on your cardiac medications before your next appointment, please call your pharmacy*  Lab Work: - None ordered  Testing/Procedures: Your physician has requested that you have an echocardiogram. Echocardiography is a painless test that uses sound waves to create images of your heart. It provides your doctor with information about the size and shape of your heart and how well your heart's chambers and valves are working. This procedure takes approximately one hour. There are no restrictions for this procedure. Please do NOT wear cologne, perfume, aftershave, or lotions (deodorant is allowed). Please arrive 15 minutes prior to your appointment time.  Please note: We ask at that you not bring children with you during ultrasound (echo/ vascular) testing. Due to room size and safety concerns, children are not allowed in the ultrasound rooms during exams. Our front office staff cannot provide observation of children in our lobby area while testing is being conducted. An adult accompanying a patient to their appointment will only be allowed in the ultrasound room at the discretion of the ultrasound technician under special circumstances. We apologize for any inconvenience.   Heart Monitor:  Your physician has requested you wear a ZIO AT (live) patch heart monitor for 14 days.  Your monitor will be mailed to your home address within 3-5 business days. This is sent via [employer] from [employer]. However, if you have not received your monitor after 5 business days please send [place] a MyChart message or call the office at [phone], so we may follow up on this for you.  YouTube search Zio patch heart monitor 3:46 for step-by-step application instructions  This monitor is a medical device (single patch monitor) that records the heart's electrical activity. Doctors most often use these monitors to diagnose arrhythmias. Arrhythmias are  problems with the speed or rhythm of the heartbeat.   [employer] supplies 1 patch per enrollment. Additional stickers are not available.  Please DO NOT apply the patch if you will be having a Nuclear Stress Test, Echocardiogram, Cardiac CT, Cardiac MRI, Chest X-ray during the period you would be wearing the monitor. The patch cannot be worn during these tests.  You cannot remove and re-apply the ZIO patch monitor.   Applying the Monitor: Once you receive your monitor, this will include a small razor, abrader, and 4 alcohol pads. Shave hair from upper left chest Rub abrader disc in 40 strokes over the left upper chest as indicated in your monitor instructions Clean area with 4 enclosed alcohol pads (there may be a mild & brief stinging sensation over the newly abraded area, but this is normal). Let dry Apply patch as indicated in monitor instructions. Patch will be placed under collarbone on the left side of the chest with arrow pointing upward. Rub adhesive wings for 2 minutes. Remove white label marked "1". Remove the white label marked "2". Rub patch adhesive wings for an 2 minutes.  While looking in a mirror, press and release button in the center of the patch. You may hear a "click". A small green light will flash 4-6 times and then stop. This will be your indicator that the monitor has been turned on.  Wearing the Monitor: Avoid showering during the first 24 hours of wearing the monitor.  After 24 hours you may shower with the patch on. Take brief showers with your back facing the shower head.  Avoid excessive sweating to help maximize wear time. Do not submerge the  device, no hot tubs, and no swimming pools. Keep any lotions or oils away from the patch. Press the button if you feel a symptom. You will hear a small click. Record date, time, and symptoms in the Patient Logbook or App.  Monitor Issues: Call [employer] Customer Care at [phone] if you have questions regarding  your Zio Patch Monitor. Call them immediately if you see an orange/ amber colored light blinking on your monitor. If your monitor falls off and you cannot get this reapplied or if you need suggestions for securing your monitor call [employer] at [phone].   Returning the Monitor: Once you have completed wearing your monitor, follow instructions on the last 2 pages of the Patient Logbook. Stick monitor patch on to the last page of the Patient Logbook.  Place Patient Logbook with monitor in the return box provided. Use locking tab on box and tape box closed securely. The return box has pre-paid postage on it.  Place the return box in the regular [place] Mail box as soon as possible It will take anywhere from 1-2 weeks for your provider to receive and review your results once you mail this back. If for some reason you have misplaced your return box then call our office and we can provide another box and/or mail it off for you.   Billing  and Patient Assistance Program Information: We have supplied [employer] with any of your insurance information on file for billing purposes. [employer] offers a sliding scale Patient Assistance Program for patients that do not have insurance, or whose insurance does not completely cover the cost of the ZIO monitor. You must apply for the Patient Assistance Program to qualify for this discounted rate. To apply, please call [employer] at [phone], select option 1, ask to apply for the Patient Assistance Program. [employer] will ask your household income, and how many people are in your household. They will quote your out-of-pocket cost based on that information. [employer] will also be able to set up for a 44-month, interest-free payment plan if needed.     Follow-Up: At [facility], you and your health needs are our priority.  As part of our continuing mission to provide you with exceptional heart care, we have created designated Provider Care Teams.  These Care Teams  include your primary Cardiologist (physician) and Advanced Practice Providers (APPs -  Physician Assistants and Nurse Practitioners) who all work together to provide you with the care you need, when you need it.  Your next appointment:   6 week(s)  Provider:   Nicolasa Ducking, NP

## 2022-12-16 ENCOUNTER — Ambulatory Visit
Admission: RE | Admit: 2022-12-16 | Discharge: 2022-12-16 | Disposition: A | Discharge status: Home / Self Care | Payer: BC Managed Care – PPO | Source: Ambulatory Visit | Attending: Family Medicine

## 2022-12-16 ENCOUNTER — Inpatient Hospital Stay
Admission: RE | Admit: 2022-12-16 | Discharge: 2022-12-16 | Discharge status: Home / Self Care | Payer: BC Managed Care – PPO | Source: Ambulatory Visit | Attending: Family Medicine

## 2022-12-16 DIAGNOSIS — R42 Dizziness and giddiness: Secondary | ICD-10-CM | POA: Diagnosis not present

## 2022-12-16 MED ORDER — GADOPICLENOL 0.5 MMOL/ML IV SOLN
9.0000 mL | Freq: Once | INTRAVENOUS | Status: AC | PRN
  Administered 2022-12-16: 9 mL via INTRAVENOUS

## 2022-12-21 DIAGNOSIS — I495 Sick sinus syndrome: Secondary | ICD-10-CM | POA: Diagnosis not present

## 2022-12-21 DIAGNOSIS — R55 Syncope and collapse: Secondary | ICD-10-CM | POA: Diagnosis not present

## 2022-12-24 ENCOUNTER — Other Ambulatory Visit: Payer: Self-pay | Admitting: Physician Assistant

## 2022-12-24 DIAGNOSIS — M542 Cervicalgia: Secondary | ICD-10-CM

## 2022-12-24 NOTE — Telephone Encounter (Signed)
Requested medication (s) are due for refill today -unsure  Requested medication (s) are on the active medication list -yes  Future visit scheduled -yes  Last refill: 11/26/22 #30  Notes to clinic: Rx for acute problem- review to continue  Requested Prescriptions  Pending Prescriptions Disp Refills   meloxicam (MOBIC) 7.5 MG tablet [Pharmacy Med Name: MELOXICAM 7.5 MG TABLET] 30 tablet 0    Sig: TAKE 1 TABLET BY MOUTH EVERY DAY     Analgesics:  COX2 Inhibitors Failed - 12/24/2022 11:04 AM      Failed - Manual Review: Labs are only required if the patient has taken medication for more than 8 weeks.      Passed - HGB in normal range and within 360 days    Hemoglobin  Date Value Ref Range Status  11/12/2022 15.9 13.0 - 17.0 g/dL Final  29/56/2130 86.5 13.0 - 17.7 g/dL Final         Passed - Cr in normal range and within 360 days    Creat  Date Value Ref Range Status  08/13/2022 0.95 0.60 - 1.26 mg/dL Final   Creatinine, Ser  Date Value Ref Range Status  11/12/2022 0.99 0.61 - 1.24 mg/dL Final         Passed - HCT in normal range and within 360 days    HCT  Date Value Ref Range Status  11/12/2022 45.9 39.0 - 52.0 % Final   Hematocrit  Date Value Ref Range Status  05/13/2021 44.8 37.5 - 51.0 % Final         Passed - AST in normal range and within 360 days    AST  Date Value Ref Range Status  08/13/2022 21 10 - 40 U/L Final         Passed - ALT in normal range and within 360 days    ALT  Date Value Ref Range Status  08/13/2022 45 9 - 46 U/L Final         Passed - eGFR is 30 or above and within 360 days    GFR, Est African American  Date Value Ref Range Status  04/25/2020 102 > OR = 60 mL/min/1.81m2 Final   GFR, Est Non African American  Date Value Ref Range Status  04/25/2020 88 > OR = 60 mL/min/1.64m2 Final   GFR, Estimated  Date Value Ref Range Status  11/12/2022 >60 >60 mL/min Final    Comment:    (NOTE) Calculated using the CKD-EPI Creatinine  Equation (2021)    eGFR  Date Value Ref Range Status  08/13/2022 104 > OR = 60 mL/min/1.21m2 Final  05/13/2021 92 >59 mL/min/1.73 Final         Passed - Patient is not pregnant      Passed - Valid encounter within last 12 months    Recent Outpatient Visits           4 weeks ago Syncope, unspecified syncope type   [facility] Mecum, Oswaldo Conroy, PA-C   4 months ago Vertigo   [facility] Danelle Berry, PA-C   4 months ago Syncope and collapse   [facility] Danelle Berry, PA-C   4 months ago Annual physical exam   [facility] Danelle Berry, PA-C   1 year ago Anxiety disorder, unspecified type   [facility] Danelle Berry, [place]       Future Appointments  In 1 month Brion Aliment, Dois Davenport, NP [facility] [place]   In 1 month Danelle Berry, PA-C [facility]               Requested Prescriptions  Pending Prescriptions Disp Refills   meloxicam (MOBIC) 7.5 MG tablet [Pharmacy Med Name: MELOXICAM 7.5 MG TABLET] 30 tablet 0    Sig: TAKE 1 TABLET BY MOUTH EVERY DAY     Analgesics:  COX2 Inhibitors Failed - 12/24/2022 11:04 AM      Failed - Manual Review: Labs are only required if the patient has taken medication for more than 8 weeks.      Passed - HGB in normal range and within 360 days    Hemoglobin  Date Value Ref Range Status  11/12/2022 15.9 13.0 - 17.0 g/dL Final  86/57/8469 62.9 13.0 - 17.7 g/dL Final         Passed - Cr in normal range and within 360 days    Creat  Date Value Ref Range Status  08/13/2022 0.95 0.60 - 1.26 mg/dL Final   Creatinine, Ser  Date Value Ref Range Status  11/12/2022 0.99 0.61 - 1.24 mg/dL Final         Passed - HCT in normal range and within 360 days    HCT  Date Value Ref Range Status  11/12/2022 45.9 39.0 - 52.0 % Final   Hematocrit  Date  Value Ref Range Status  05/13/2021 44.8 37.5 - 51.0 % Final         Passed - AST in normal range and within 360 days    AST  Date Value Ref Range Status  08/13/2022 21 10 - 40 U/L Final         Passed - ALT in normal range and within 360 days    ALT  Date Value Ref Range Status  08/13/2022 45 9 - 46 U/L Final         Passed - eGFR is 30 or above and within 360 days    GFR, Est African American  Date Value Ref Range Status  04/25/2020 102 > OR = 60 mL/min/1.61m2 Final   GFR, Est Non African American  Date Value Ref Range Status  04/25/2020 88 > OR = 60 mL/min/1.57m2 Final   GFR, Estimated  Date Value Ref Range Status  11/12/2022 >60 >60 mL/min Final    Comment:    (NOTE) Calculated using the CKD-EPI Creatinine Equation (2021)    eGFR  Date Value Ref Range Status  08/13/2022 104 > OR = 60 mL/min/1.33m2 Final  05/13/2021 92 >59 mL/min/1.73 Final         Passed - Patient is not pregnant      Passed - Valid encounter within last 12 months    Recent Outpatient Visits           4 weeks ago Syncope, unspecified syncope type   [facility] Mecum, Oswaldo Conroy, PA-C   4 months ago Vertigo   [facility] Danelle Berry, PA-C   4 months ago Syncope and collapse   [facility] Danelle Berry, PA-C   4 months ago Annual physical exam   [facility] Danelle Berry, PA-C   1 year ago Anxiety disorder, unspecified type   [facility] Danelle Berry, [place]       Future Appointments  In 1 month Brion Aliment, Dois Davenport, NP [facility] [place]   In 1 month Danelle Berry, PA-C [facility]

## 2023-01-05 ENCOUNTER — Encounter: Payer: Self-pay | Admitting: Diagnostic Neuroimaging

## 2023-01-05 ENCOUNTER — Ambulatory Visit: Payer: BC Managed Care – PPO | Admitting: Diagnostic Neuroimaging

## 2023-01-05 VITALS — BP 144/89 | HR 85 | Ht 69.0 in | Wt 196.0 lb

## 2023-01-05 DIAGNOSIS — G40909 Epilepsy, unspecified, not intractable, without status epilepticus: Secondary | ICD-10-CM

## 2023-01-05 DIAGNOSIS — R55 Syncope and collapse: Secondary | ICD-10-CM

## 2023-01-05 NOTE — Patient Instructions (Signed)
  Episodes of loss of consciousness (3 events on 08/06/22; 3 events on 11/12/22; unclear whether this was seizure vs syncope; history of similar events from age [age] years old)  - check MRI brain, EEG  - follow up with cardiology; consider implanted loop recorder  - According to [place] law, you can not drive unless you are seizure / syncope free for at least 6 months and under physician's care.   - Please maintain precautions. Do not participate in activities where a loss of awareness could harm you or someone else. No swimming alone, no tub bathing, no hot tubs, no driving, no operating motorized vehicles (cars, ATVs, motocycles, etc), lawnmowers, power tools or firearms. No standing at heights, such as rooftops, ladders or stairs. Avoid hot objects such as stoves, heaters, open fires. Wear a helmet when riding a bicycle, scooter, skateboard, etc. and avoid areas of traffic. Set your water heater to 120 degrees or less.   - regarding work duties; caution with operating machines; recommend light duty / desk work next ~1-2 months, until workup completed and symptoms resolved

## 2023-01-05 NOTE — Progress Notes (Signed)
[facility]  PATIENT: Mario Boyd DOB: 1985-08-16  REFERRING CLINICIAN: Janith Lima, MD HISTORY FROM: patient  REASON FOR VISIT: new consult   HISTORICAL  CHIEF COMPLAINT:  Chief Complaint  Patient presents with   New Patient (Initial Visit)    Patient in room #7 and alone. Patient states he hasn't leave the house in weeks and he having bad anxiety when he does leave.    HISTORY OF PRESENT ILLNESS:   37 year old male here for evaluation of syncope for seizure.  Between age 72 and 80 years old patient had intermittent episodes of seizure and syncope.  Patient had extensive testing at [facility] including neurology and cardiology. Some episodes involved lightheadedness and passing out in the setting of heavy exertion and playing sports.  Other episodes happen without warning where he would simply fall down and passed out.  Afterwards he would feel tired and groggy.  He was on some antiseizure medications when he was younger but does not remember these.  By age 63 he had had no further episodes and came off medication.  August 06, 2022 patient was at a golf tournament in the morning when all of a sudden he felt nausea, had to go to the bathroom, and passed out.  He woke up profusely sweating.  Bystanders helped him sit up and then he passed out again.  Bystanders tried to help him up again a third time and he passed out.  Eventually he was able to get up and return to baseline.  November 12, 2022 patient was in a meeting, when all of a sudden he had a similar sick sensation, nausea, and slumped over at his desk.  When he came to he tried to stand up to use the bathroom but passed out a second time.  He did bystanders tried to help him sit up and he passed of the third time.  He was noted to have some "foaming at the mouth" and was also noted to have a decreased pulse.  Patient went to the emergency room for evaluation.  He is also been to  cardiology.   REVIEW OF SYSTEMS: Full 14 system review of systems performed and negative with exception of: as per HPI.  ALLERGIES: No Known Allergies  HOME MEDICATIONS: Outpatient Medications Prior to Visit  Medication Sig Dispense Refill   meclizine (ANTIVERT) 25 MG tablet Take 25 mg by mouth 3 (three) times daily as needed.     meloxicam (MOBIC) 7.5 MG tablet TAKE 1 TABLET BY MOUTH EVERY DAY 30 tablet 0   SUMAtriptan (IMITREX) 50 MG tablet Take 0.5-2 tablets (25-100 mg total) by mouth at HA/vertigo episode onset. May repeat in 2 hours if headache /sx persists or recurs.  Max 200 mg dose in 24 hours 10 tablet 1   hydrOXYzine (VISTARIL) 25 MG capsule Take 1 capsule (25 mg total) by mouth every 8 (eight) hours as needed. 30 capsule 0   No facility-administered medications prior to visit.    PAST MEDICAL HISTORY: Past Medical History:  Diagnosis Date   Asthma    childhood   Hidradenitis suppurativa    Hyperlipidemia    Migraines    Seizures (HCC)    a. as a child - last age ~ 56   Vasovagal syncope    a. as child up to age 33 - usually w/ activity.    PAST SURGICAL HISTORY: Past Surgical History:  Procedure Laterality Date   HERNIA REPAIR     KIDNEY  SURGERY     NEPHRECTOMY LIVING DONOR Right 2009   TONSILLECTOMY     TYMPANOSTOMY TUBE PLACEMENT      FAMILY HISTORY: Family History  Problem Relation Age of Onset   Hyperlipidemia Mother    Hypertension Mother    Other Mother        thrombocytosis   Hyperlipidemia Father    Hypertension Father    Epilepsy Sister    Hypertension Sister    Epilepsy Son    Heart failure Maternal Grandmother    Diabetes Maternal Grandfather    Heart failure Maternal Grandfather    Other Maternal Grandfather        thrombocytosis   Diabetes Paternal Grandmother    Hypertension Paternal Grandfather    Prostate cancer Paternal Grandfather     SOCIAL HISTORY: Social History   Socioeconomic History   Marital status: Married     Spouse name: alice   Number of children: 2   Years of education: 12   Highest education level: Associate degree: occupational, Scientist, product/process development, or vocational program  Occupational History   Occupation: unemployed  Tobacco Use   Smoking status: Former    Current packs/day: 0.50    Average packs/day: 0.5 packs/day for 15.0 years (7.5 ttl pk-yrs)    Types: Cigarettes   Smokeless tobacco: Never  Vaping Use   Vaping status: Every Day   Substances: Nicotine, Flavoring  Substance and Sexual Activity   Alcohol use: Yes    Comment: Rarely - 1 drink < 1x/mo   Drug use: Yes    Types: Marijuana    Comment: every morning (works nights, has a blunt prior to sleep in the AM)   Sexual activity: Yes    Comment: wife on birth control, 9 years  Other Topics Concern   Not on file  Social History Narrative   Vape; quit smoking; no alcohol; works at [employer]; in [place]/wife;and 2 children.    Social Drivers of Corporate investment banker Strain: Low Risk  (08/13/2022)   Overall Financial Resource Strain (CARDIA)    Difficulty of Paying Living Expenses: Not hard at all  Food Insecurity: No Food Insecurity (08/13/2022)   Hunger Vital Sign    Worried About Running Out of Food in the Last Year: Never true    Ran Out of Food in the Last Year: Never true  Transportation Needs: No Transportation Needs (08/13/2022)   PRAPARE - Administrator, Civil Service (Medical): No    Lack of Transportation (Non-Medical): No  Physical Activity: Inactive (08/13/2022)   Exercise Vital Sign    Days of Exercise per Week: 0 days    Minutes of Exercise per Session: 0 min  Stress: Stress Concern Present (08/13/2022)   [employer] of Occupational Health - Occupational Stress Questionnaire    Feeling of Stress : Very much  Social Connections: Moderately Integrated (08/13/2022)   Social Connection and Isolation Panel [NHANES]    Frequency of Communication with Friends and Family: More than three times a week     Frequency of Social Gatherings with Friends and Family: More than three times a week    Attends Religious Services: More than 4 times per year    Active Member of [employer] or Organizations: No    Attends Banker Meetings: Never    Marital Status: Married  Catering manager Violence: Not At Risk (08/13/2022)   Humiliation, Afraid, Rape, and Kick questionnaire    Fear of Current or Ex-Partner: No  Emotionally Abused: No    Physically Abused: No    Sexually Abused: No     PHYSICAL EXAM  GENERAL EXAM/CONSTITUTIONAL: Vitals:  Vitals:   01/05/23 1032  BP: (!) 144/89  Pulse: 85  Weight: 196 lb (88.9 kg)  Height: 5\' 9"  (1.753 m)   Body mass index is 28.94 kg/m. Wt Readings from Last 3 Encounters:  01/05/23 196 lb (88.9 kg)  12/15/22 194 lb 3.2 oz (88.1 kg)  11/26/22 187 lb 11.2 oz (85.1 kg)   Patient is in no distress; well developed, nourished and groomed; neck is supple  CARDIOVASCULAR: Examination of carotid arteries is normal; no carotid bruits Regular rate and rhythm, no murmurs Examination of peripheral vascular system by observation and palpation is normal  EYES: Ophthalmoscopic exam of optic discs and posterior segments is normal; no papilledema or hemorrhages No results found.  MUSCULOSKELETAL: Gait, strength, tone, movements noted in Neurologic exam below  NEUROLOGIC: MENTAL STATUS:      No data to display         awake, alert, oriented to person, place and time recent and remote memory intact normal attention and concentration language fluent, comprehension intact, naming intact fund of knowledge appropriate  CRANIAL NERVE:  2nd - no papilledema on fundoscopic exam 2nd, 3rd, 4th, 6th - pupils equal and reactive to light, visual fields full to confrontation, extraocular muscles intact, no nystagmus 5th - facial sensation symmetric 7th - facial strength symmetric 8th - hearing intact 9th - palate elevates symmetrically, uvula midline 11th -  shoulder shrug symmetric 12th - tongue protrusion midline  MOTOR:  normal bulk and tone, full strength in the BUE, BLE  SENSORY:  normal and symmetric to light touch, temperature, vibration  COORDINATION:  finger-nose-finger, fine finger movements normal  REFLEXES:  deep tendon reflexes present and symmetric  GAIT/STATION:  narrow based gait     DIAGNOSTIC DATA (LABS, IMAGING, TESTING) - I reviewed patient records, labs, notes, testing and imaging myself where available.  Lab Results  Component Value Date   WBC 10.8 (H) 11/12/2022   HGB 15.9 11/12/2022   HCT 45.9 11/12/2022   MCV 88.3 11/12/2022   PLT 439 (H) 11/12/2022      Component Value Date/Time   NA 135 11/12/2022 1815   NA 139 05/13/2021 1037   K 3.5 11/12/2022 1815   CL 104 11/12/2022 1815   CO2 21 (L) 11/12/2022 1815   GLUCOSE 98 11/12/2022 1815   BUN 16 11/12/2022 1815   BUN 13 05/13/2021 1037   CREATININE 0.99 11/12/2022 1815   CREATININE 0.95 08/13/2022 1552   CALCIUM 8.9 11/12/2022 1815   CALCIUM 10.5 (H) 09/27/2018 1148   PROT 6.8 08/13/2022 1552   PROT 7.3 05/13/2021 1037   ALBUMIN 4.7 05/13/2021 1037   AST 21 08/13/2022 1552   ALT 45 08/13/2022 1552   ALKPHOS 76 05/13/2021 1037   BILITOT 0.5 08/13/2022 1552   BILITOT 0.3 05/13/2021 1037   GFRNONAA >60 11/12/2022 1815   GFRNONAA 88 04/25/2020 1218   GFRAA 102 04/25/2020 1218   Lab Results  Component Value Date   CHOL 209 (H) 08/13/2022   HDL 46 08/13/2022   LDLCALC 128 (H) 08/13/2022   TRIG 213 (H) 08/13/2022   CHOLHDL 4.5 08/13/2022   Lab Results  Component Value Date   HGBA1C 5.5 09/06/2018   No results found for: "VITAMINB12" Lab Results  Component Value Date   TSH 1.570 05/13/2021    08/21/22 CT head [I reviewed images myself and  agree with interpretation. -VRP]  - No acute intracranial abnormality.   12/16/22 MRA head / neck  Normal MRA of the head and neck. No large vessel occlusion, hemodynamically significant  stenosis, or other vascular abnormality.   ASSESSMENT AND PLAN  37 y.o. year old male here with:   Dx:  1. Recurrent syncope   2. Seizure disorder (HCC)     PLAN:  Episodes of loss of consciousness (3 events on 08/06/22; 3 events on 11/12/22; unclear whether this was seizure vs syncope; history of similar events from age [age] years old)  - check MRI brain, EEG  - follow up with cardiology; consider implanted loop recorder  - According to [place] law, you can not drive unless you are seizure / syncope free for at least 6 months and under physician's care.   - Please maintain precautions. Do not participate in activities where a loss of awareness could harm you or someone else. No swimming alone, no tub bathing, no hot tubs, no driving, no operating motorized vehicles (cars, ATVs, motocycles, etc), lawnmowers, power tools or firearms. No standing at heights, such as rooftops, ladders or stairs. Avoid hot objects such as stoves, heaters, open fires. Wear a helmet when riding a bicycle, scooter, skateboard, etc. and avoid areas of traffic. Set your water heater to 120 degrees or less.   - regarding work duties; caution with operating machines; recommend light duty / desk work next ~1-2 months, until workup completed and symptoms resolved  Orders Placed This Encounter  Procedures   MR BRAIN W WO CONTRAST   EEG adult   Return in about 6 months (around 07/06/2023) for MyChart visit (15 min).  I reviewed images, labs, notes, records myself. I summarized findings and reviewed with patient, for this high risk condition (seizure vs syncope) requiring high complexity decision making.    Suanne Marker, MD 01/05/2023, 12:00 PM Certified in Neurology, Neurophysiology and Neuroimaging  [facility] [address] [place], [place] [address] [phone]

## 2023-01-09 ENCOUNTER — Ambulatory Visit: Payer: BC Managed Care – PPO | Attending: Nurse Practitioner

## 2023-01-19 ENCOUNTER — Telehealth: Payer: Self-pay | Admitting: Diagnostic Neuroimaging

## 2023-01-19 NOTE — Telephone Encounter (Signed)
 Bridgett with [employer](pt's job) needling to clarify  restrictions listed on pt's work note. Requesting call back to [phone]. [phone] Would also like restrictions emailed to [email or web address] if possible

## 2023-01-20 NOTE — Telephone Encounter (Signed)
 Bridgett from [employer] has called back, message from [place], [place] was read.  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  She is willing to email an attachment of the job description if needed, please call her back at [phone]

## 2023-01-20 NOTE — Telephone Encounter (Signed)
 Called Bridgett back and there was no answer Left a detailed message advising I don't see where we wrote a note previously only that Dr Margaret made suggestions in his office notes. If a work note is needed, I can complete I would just need to know exactly what they are wanting on the letter.  **If she calls back and I am unable to take the call, please ask what is needed exactly. I can list the information below in a letterhead. I just want to make sure I include what they need/want on letter.  These were the recommendations per Dr Chancy note. Please maintain precautions. Do not participate in activities where a loss of awareness could harm you or someone else. No swimming alone, no tub bathing, no hot tubs, no driving, no operating motorized vehicles (cars, ATVs, motocycles, etc), lawnmowers, power tools or firearms. No standing at heights, such as rooftops, ladders or stairs. Avoid hot objects such as stoves, heaters, open fires. Wear a helmet when riding a bicycle, scooter, skateboard, etc. and avoid areas of traffic. Set your water heater to 120 degrees or less.    - regarding work duties; caution with operating machines; recommend light duty / desk work next ~1-2 months, until workup completed and symptoms resolved

## 2023-01-21 ENCOUNTER — Encounter: Payer: Self-pay | Admitting: Neurology

## 2023-01-21 NOTE — Telephone Encounter (Signed)
 Called Bridgett back and was able to get in touch with her to discuss what is needed.  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  I asked that if he was to black out and drop the nail file is this something that could cause physical harm to him as far as if he could cut himself and she stated it doesn't.  She will send me email with job descriptions and what they have in place for him in regard to restrictions and we can either agree to what is listed or write a letter on our letter head stating pt should follow our restrictions. Etc.. advised I will confirm with Dr Salli Crawley and respond back with his thoughts  She was appreciative for the call back.

## 2023-01-28 ENCOUNTER — Other Ambulatory Visit: Payer: BC Managed Care – PPO

## 2023-01-30 ENCOUNTER — Ambulatory Visit: Payer: BC Managed Care – PPO | Admitting: Nurse Practitioner

## 2023-01-30 NOTE — Progress Notes (Deleted)
 Office Visit    Patient Name: Mario Boyd Date of Encounter: 01/30/2023  Primary Care Provider:  Danelle Berry, PA-C Primary Cardiologist:  None  Chief Complaint    38 y.o. male with a history of presyncope and syncope, hyperlipidemia with statin intolerance, hypertriglyceridemia, LFT abnormalities with subsequent normalization, and anxiety, who presents for follow-up related to syncope.   Past Medical History  Subjective   Past Medical History:  Diagnosis Date   Asthma    childhood   Hidradenitis suppurativa    Hyperlipidemia    Migraines    Seizures (HCC)    a. as a child - last age ~ 63   Vasovagal syncope    a. as child up to age 71 - usually w/ activity.   Past Surgical History:  Procedure Laterality Date   HERNIA REPAIR     KIDNEY SURGERY     NEPHRECTOMY LIVING DONOR Right 2009   TONSILLECTOMY     TYMPANOSTOMY TUBE PLACEMENT      Allergies  No Known Allergies    History of Present Illness      38 y.o. y/o male with a history of presyncope and syncope, hyperlipidemia with statin intolerance, hypertriglyceridemia, migraines, LFT abnormalities with subsequent normalization, and anxiety.  Patient was locally with his wife and 2 children.  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  He does not routinely exercise outside of work but is active in and around his home and with his children without symptoms or limitations.  He previously smoked cigarettes for about 15 years and has been vaping over the past several years.  He works night shift and smokes marijuana when he comes home from work in the morning, to help him fall asleep.  As a child, he was diagnosed with seizures which she says he subsequently "outgrew."  He was also diagnosed with vasovagal syncope, that typically happened with exercise and sports.  He thinks that his last bout of vasovagal syncope as a child was around age 85, when he was in high school and playing basketball.  In  August 2024, while at a golf event, during which he does not think he was hydrating appropriately, he developed severe lightheadedness and GI distress followed by loss of consciousness.  He regained consciousness within a few seconds and was taken to a local emergency department where ECG was unremarkable.  Creatinine was mildly elevated at 1.16 and troponins were normal.  Head CT showed no acute findings.  He was discharged home with a ZIO monitor but did not place.  He established care in December 2024 following recurrent, brief syncope while sitting at work, associated with prodromal feeling of lightheadedness and warmth.  Symptoms seem to improve with lying down.  He was again taken to the emergency room where ECG and blood work was unremarkable with exception of mild leukocytosis at 10.8.  Symptoms resolved with IV fluids.   Following cardiology visit, he wore a ZIO monitor.  Final results are not available though review of preliminary findings via the eye rhythm application, does not show any significant arrhythmias. Objective  Home Medications    Current Outpatient Medications  Medication Sig Dispense Refill   meclizine (ANTIVERT) 25 MG tablet Take 25 mg by mouth 3 (three) times daily as needed.     meloxicam (MOBIC) 7.5 MG tablet TAKE 1 TABLET BY MOUTH EVERY DAY 30 tablet 0   SUMAtriptan (IMITREX) 50 MG tablet Take 0.5-2 tablets (25-100 mg total)  by mouth at HA/vertigo episode onset. May repeat in 2 hours if headache /sx persists or recurs.  Max 200 mg dose in 24 hours 10 tablet 1   No current facility-administered medications for this visit.     Physical Exam    VS:  There were no vitals taken for this visit. , BMI There is no height or weight on file to calculate BMI.       GEN: Well nourished, well developed, in no acute distress. HEENT: normal. Neck: Supple, no JVD, carotid bruits, or masses. Cardiac: RRR, no murmurs, rubs, or gallops. No clubbing, cyanosis, edema.  Radials 2+/PT  2+ and equal bilaterally.  Respiratory:  Respirations regular and unlabored, clear to auscultation bilaterally. GI: Soft, nontender, nondistended, BS + x 4. MS: no deformity or atrophy. Skin: warm and dry, no rash. Neuro:  Strength and sensation are intact. Psych: Normal affect.  Accessory Clinical Findings    ECG personally reviewed by me today -    *** - no acute changes.  Lab Results  Component Value Date   WBC 10.8 (H) 11/12/2022   HGB 15.9 11/12/2022   HCT 45.9 11/12/2022   MCV 88.3 11/12/2022   PLT 439 (H) 11/12/2022   Lab Results  Component Value Date   CREATININE 0.99 11/12/2022   BUN 16 11/12/2022   NA 135 11/12/2022   K 3.5 11/12/2022   CL 104 11/12/2022   CO2 21 (L) 11/12/2022   Lab Results  Component Value Date   ALT 45 08/13/2022   AST 21 08/13/2022   ALKPHOS 76 05/13/2021   BILITOT 0.5 08/13/2022   Lab Results  Component Value Date   CHOL 209 (H) 08/13/2022   HDL 46 08/13/2022   LDLCALC 128 (H) 08/13/2022   TRIG 213 (H) 08/13/2022   CHOLHDL 4.5 08/13/2022    Lab Results  Component Value Date   HGBA1C 5.5 09/06/2018   Lab Results  Component Value Date   TSH 1.570 05/13/2021       Assessment & Plan    1.  ***  Mario Ducking, NP 01/30/2023, 10:41 AM

## 2023-02-04 ENCOUNTER — Ambulatory Visit (INDEPENDENT_AMBULATORY_CARE_PROVIDER_SITE_OTHER): Payer: BC Managed Care – PPO | Admitting: Diagnostic Neuroimaging

## 2023-02-04 DIAGNOSIS — R55 Syncope and collapse: Secondary | ICD-10-CM

## 2023-02-04 DIAGNOSIS — G40909 Epilepsy, unspecified, not intractable, without status epilepticus: Secondary | ICD-10-CM

## 2023-02-06 ENCOUNTER — Ambulatory Visit: Payer: BC Managed Care – PPO | Admitting: Cardiology

## 2023-02-13 ENCOUNTER — Ambulatory Visit: Payer: BC Managed Care – PPO | Admitting: Family Medicine

## 2023-02-13 VITALS — BP 122/82 | HR 90 | Resp 16 | Ht 69.0 in | Wt 195.0 lb

## 2023-02-13 DIAGNOSIS — F988 Other specified behavioral and emotional disorders with onset usually occurring in childhood and adolescence: Secondary | ICD-10-CM

## 2023-02-13 DIAGNOSIS — F413 Other mixed anxiety disorders: Secondary | ICD-10-CM | POA: Diagnosis not present

## 2023-02-13 MED ORDER — CITALOPRAM HYDROBROMIDE 20 MG PO TABS: ORAL_TABLET | ORAL | 0 refills | Status: DC

## 2023-02-13 NOTE — Assessment & Plan Note (Signed)
 having severe sx, panic attacks, unable to leave house during daytime, hx of prior severe generalized anxiety and likely social anxiety  GAD 7 positive and very high He hasn't been able to work for months due to symptoms Severe fears of talking on the phone, being in public, going to work in the daytime, going into work where he had bad episodes of seizure/syncope Previously he did have sig improvement in prior anxiety sx which very similarly heightened after stressful situation at work and citalopram  did seem to be very effective for him and he tolerated it but he does have a pattern of improving and then discontinuing all of his medications He is also reportedly a little scared of medications and side effects We discussed his prior possible intolerance of BuSpar  and we will avoid that He agrees to restart Celexa  at a lower dose We discussed other resources and types of therapy which may be helpful for how severe his symptoms are and how many different types of anxiety disorders and possibly panic disorder or PTSD is currently having and he does agree to consult with psychiatry

## 2023-02-13 NOTE — Progress Notes (Signed)
 Name: Mario Boyd   MRN: [id number]    DOB: [birth year]   Date:02/13/2023       Progress Note  Chief Complaint  Patient presents with   Medical Management of Chronic Issues    6 months     Subjective:   Mario Boyd is a 38 y.o. male, presents to clinic for routine follow up on chronic conditions  History of elevated platelets, high cholesterol and HS all of which he is not on meds for currently  Most recent acute problems and sx have not improved but unfortunately continued with a worse episode of possible seizure activity with syncope while at work in early November.  He did get transported to the hospital and he has continued to work with neurology and cardiology HA's with vertigo/near syncope - extensive work up several months ago, tried triptan for possible atypical migraine/vestibular migraine - saw Neuro, also has meclizine He has not taken sumatriptan  or meclizine at all The neurologist thought it might be related to his blood pressure and did not think that it would be atypical migraines, central vertigo, or seizure disorder that the patient does insist that he had seizure disorder and was on seizure medications throughout his childhood He did just recently do a EEG and he has had multiple imaging done of his head, 1 pending with neurology  He endorses today much more severe anxiety with the very strong history of generalized anxiety and social anxiety previously on the past when anxiety became debilitating several years ago he at that time had stated he could not leave the house or take his kids to the park because he had so much panic fear and anxiety he is currently having similar symptoms since his episodes of syncope at work.  He reports the anxiety has been so severe that he is been out of work for about 2 months.  He is having difficulty going outside in the daytime and he has been unable to make himself go to work during daytime hours he has been working later shift -he did  get seen in this office for FMLA to help him with this  In the past he stopped his anxiety medications after improving and doing really well, he appeared very different in clinic was very relaxed he got a new job and was much more functional.  He had been started on Celexa  and slowly increase the dose to 40 mg daily and was using BuSpar  as needed but he believes the BuSpar  made him feel very fatigued and somewhat dizzy and out of it He is willing to consult with psychiatry and restart Celexa  He also reports a diagnosis of ADHD as a child but he could not be medicated at the time because of his seizure medicines he will try to get the records which he believes his mom just saw in her home and have available     11/26/2022   11:18 AM 08/26/2022    1:24 PM 08/18/2022    8:11 AM  Depression screen PHQ 2/9  Decreased Interest 0 0 0  Down, Depressed, Hopeless 0 0 0  PHQ - 2 Score 0 0 0  Altered sleeping 0 0 0  Tired, decreased energy 0 0 0  Change in appetite 0 0 0  Feeling bad or failure about yourself  0 0 0  Trouble concentrating 0 0 0  Moving slowly or fidgety/restless 0 0 0  Suicidal thoughts 0 0 0  PHQ-9 Score 0 0 0  Difficult  doing work/chores Not difficult at all Not difficult at all Not difficult at all      02/13/2023    3:28 PM 08/26/2022    1:25 PM 07/11/2021    2:16 PM 02/07/2021    9:38 AM  GAD 7 : Generalized Anxiety Score  Nervous, Anxious, on Edge 3 3 0 0  Control/stop worrying 3 3 0 0  Worry too much - different things 3 3 0 0  Trouble relaxing 3 3 0 0  Restless 3 3 0 0  Easily annoyed or irritable 1 0 0 0  Afraid - awful might happen 3 0 0 0  Total GAD 7 Score 19 15 0 0  Anxiety Difficulty  Very difficult Not difficult at all Not difficult at all            Current Outpatient Medications:    meclizine (ANTIVERT) 25 MG tablet, Take 25 mg by mouth 3 (three) times daily as needed., Disp: , Rfl:    meloxicam  (MOBIC ) 7.5 MG tablet, TAKE 1 TABLET BY MOUTH EVERY DAY,  Disp: 30 tablet, Rfl: 0   SUMAtriptan  (IMITREX ) 50 MG tablet, Take 0.5-2 tablets (25-100 mg total) by mouth at HA/vertigo episode onset. May repeat in 2 hours if headache /sx persists or recurs.  Max 200 mg dose in 24 hours, Disp: 10 tablet, Rfl: 1  Patient Active Problem List   Diagnosis Date Noted   Vertigo 11/27/2022   Syncope 11/27/2022   Abnormal transaminases 08/04/2019   Psychophysiological insomnia 08/04/2019   Anxiety disorder 08/04/2019   Hyperlipidemia 05/24/2019   Myalgia due to statin 05/24/2019   Immunocompromised state due to drug therapy (HCC) 01/24/2019   Cervical lymphadenopathy 01/24/2019   Chronic rhinitis 01/24/2019   Class 1 obesity due to excess calories with serious comorbidity and body mass index (BMI) of 32.0 to 32.9 in adult 09/17/2018   Elevated LFTs 09/17/2018   Hypertriglyceridemia 09/17/2018   History of nephrectomy, right 09/17/2018   Hidradenitis suppurativa 09/17/2018   Thrombocytosis 09/17/2018    Past Surgical History:  Procedure Laterality Date   HERNIA REPAIR     KIDNEY SURGERY     NEPHRECTOMY LIVING DONOR Right 2009   TONSILLECTOMY     TYMPANOSTOMY TUBE PLACEMENT      Family History  Problem Relation Age of Onset   Hyperlipidemia Mother    Hypertension Mother    Other Mother        thrombocytosis   Hyperlipidemia Father    Hypertension Father    Epilepsy Sister    Hypertension Sister    Epilepsy Son    Heart failure Maternal Grandmother    Diabetes Maternal Grandfather    Heart failure Maternal Grandfather    Other Maternal Grandfather        thrombocytosis   Diabetes Paternal Grandmother    Hypertension Paternal Grandfather    Prostate cancer Paternal Grandfather     Social History   Tobacco Use   Smoking status: Former    Current packs/day: 0.50    Average packs/day: 0.5 packs/day for 15.0 years (7.5 ttl pk-yrs)    Types: Cigarettes   Smokeless tobacco: Never  Vaping Use   Vaping status: Every Day   Substances:  Nicotine, Flavoring  Substance Use Topics   Alcohol use: Yes    Comment: Rarely - 1 drink < 1x/mo   Drug use: Yes    Types: Marijuana    Comment: every morning (works nights, has a blunt prior to sleep in the AM)  No Known Allergies  Health Maintenance  Topic Date Due   COVID-19 Vaccine (3 - Moderna risk series) 03/01/2023 (Originally 05/24/2019)   INFLUENZA VACCINE  04/06/2023 (Originally 08/07/2022)   Pneumococcal Vaccine 42-81 Years old (1 of 2 - PCV) 02/13/2024 (Originally 08/03/1989)   DTaP/Tdap/Td (2 - Td or Tdap) 12/16/2028   Hepatitis C Screening  Completed   HIV Screening  Completed   HPV VACCINES  Aged Out    Chart Review Today: I personally reviewed active problem list, medication list, allergies, family history, social history, health maintenance, notes from last encounter, lab results, imaging with the patient/caregiver today.   Review of Systems  Constitutional: Negative.   HENT: Negative.    Eyes: Negative.   Respiratory: Negative.    Cardiovascular: Negative.   Gastrointestinal: Negative.   Endocrine: Negative.   Genitourinary: Negative.   Musculoskeletal: Negative.   Skin: Negative.   Allergic/Immunologic: Negative.   Neurological: Negative.   Hematological: Negative.   Psychiatric/Behavioral: Negative.    All other systems reviewed and are negative.    Objective:   Vitals:   02/13/23 1451  BP: 122/82  Pulse: 90  Resp: 16  SpO2: 99%  Weight: 195 lb (88.5 kg)  Height: 5' 9 (1.753 m)    Body mass index is 28.8 kg/m.  Physical Exam Vitals and nursing note reviewed.  Constitutional:      Appearance: He is well-developed.  HENT:     Head: Normocephalic and atraumatic.     Nose: Nose normal.  Eyes:     General:        Right eye: No discharge.        Left eye: No discharge.     Conjunctiva/sclera: Conjunctivae normal.  Neck:     Trachea: No tracheal deviation.  Cardiovascular:     Rate and Rhythm: Normal rate and regular rhythm.      Pulses: Normal pulses.     Heart sounds: Normal heart sounds.  Pulmonary:     Effort: Pulmonary effort is normal. No respiratory distress.     Breath sounds: No stridor.  Skin:    General: Skin is warm and dry.     Findings: No rash.  Neurological:     Mental Status: He is alert.     Motor: No abnormal muscle tone.     Coordination: Coordination normal.  Psychiatric:        Attention and Perception: Attention normal.        Mood and Affect: Mood is anxious. Mood is not depressed or elated. Affect is not labile, blunt, flat, angry, tearful or inappropriate.        Speech: Speech normal.        Behavior: Behavior normal. Behavior is cooperative.      Functional Status Survey:   Results for orders placed or performed during the hospital encounter of 11/12/22  Basic metabolic panel   Collection Time: 11/12/22  6:15 PM  Result Value Ref Range   Sodium 135 135 - 145 mmol/L   Potassium 3.5 3.5 - 5.1 mmol/L   Chloride 104 98 - 111 mmol/L   CO2 21 (L) 22 - 32 mmol/L   Glucose, Bld 98 70 - 99 mg/dL   BUN 16 6 - 20 mg/dL   Creatinine, Ser 9.00 0.61 - 1.24 mg/dL   Calcium  8.9 8.9 - 10.3 mg/dL   GFR, Estimated >39 >39 mL/min   Anion gap 10 5 - 15  CBC   Collection Time: 11/12/22  6:15 PM  Result Value Ref Range   WBC 10.8 (H) 4.0 - 10.5 K/uL   RBC 5.20 4.22 - 5.81 MIL/uL   Hemoglobin 15.9 13.0 - 17.0 g/dL   HCT 54.0 60.9 - 47.9 %   MCV 88.3 80.0 - 100.0 fL   MCH 30.6 26.0 - 34.0 pg   MCHC 34.6 30.0 - 36.0 g/dL   RDW 87.7 88.4 - 84.4 %   Platelets 439 (H) 150 - 400 K/uL   nRBC 0.0 0.0 - 0.2 %  Urinalysis, Routine w reflex microscopic -Urine, Clean Catch   Collection Time: 11/12/22  6:17 PM  Result Value Ref Range   Color, Urine YELLOW (A) YELLOW   APPearance HAZY (A) CLEAR   Specific Gravity, Urine 1.025 1.005 - 1.030   pH 5.0 5.0 - 8.0   Glucose, UA NEGATIVE NEGATIVE mg/dL   Hgb urine dipstick NEGATIVE NEGATIVE   Bilirubin Urine NEGATIVE NEGATIVE   Ketones, ur NEGATIVE  NEGATIVE mg/dL   Protein, ur 899 (A) NEGATIVE mg/dL   Nitrite NEGATIVE NEGATIVE   Leukocytes,Ua NEGATIVE NEGATIVE   RBC / HPF 0-5 0 - 5 RBC/hpf   WBC, UA 0-5 0 - 5 WBC/hpf   Bacteria, UA RARE (A) NONE SEEN   Squamous Epithelial / HPF 0 0 - 5 /HPF   Mucus PRESENT       Assessment & Plan:   Other mixed anxiety disorders Assessment & Plan: having severe sx, panic attacks, unable to leave house during daytime, hx of prior severe generalized anxiety and likely social anxiety  GAD 7 positive and very high He hasn't been able to work for months due to symptoms Severe fears of talking on the phone, being in public, going to work in the daytime, going into work where he had bad episodes of seizure/syncope Previously he did have sig improvement in prior anxiety sx which very similarly heightened after stressful situation at work and citalopram  did seem to be very effective for him and he tolerated it but he does have a pattern of improving and then discontinuing all of his medications He is also reportedly a little scared of medications and side effects We discussed his prior possible intolerance of BuSpar  and we will avoid that He agrees to restart Celexa  at a lower dose We discussed other resources and types of therapy which may be helpful for how severe his symptoms are and how many different types of anxiety disorders and possibly panic disorder or PTSD is currently having and he does agree to consult with psychiatry  Orders: -     Citalopram  Hydrobromide; Take 0.5 tablets (10 mg total) by mouth daily for 14 days, THEN 1 tablet (20 mg total) daily.  Dispense: 77 tablet; Refill: 0 -     Ambulatory referral to Psychiatry  Attention deficit disorder of childhood Assessment & Plan: diagnosed as a child, but never on meds due to other medical dx and meds, he would like eval and management as an adult    Orders: -     Ambulatory referral to Psychiatry   6 week to 3 month f/up virtual ok  to review meds  Return in about 3 months (around 05/13/2023).   Michelene Cower, PA-C 02/13/23 2:59 PM

## 2023-02-13 NOTE — Assessment & Plan Note (Signed)
 diagnosed as a child, but never on meds due to other medical dx and meds, he would like eval and management as an adult

## 2023-03-02 NOTE — Procedures (Signed)
   [facility]  EEG (ELECTROENCEPHALOGRAM) REPORT   STUDY DATE: 02/04/23 PATIENT NAME: Mario Boyd DOB: [birth year] MRN: [id number]  ORDERING CLINICIAN: Joycelyn Schmid, MD   TECHNOLOGIST: Marcheta Grammes TECHNIQUE: Electroencephalogram was recorded utilizing standard 10-20 system of lead placement and reformatted into average and bipolar montages.  RECORDING TIME: 25 minutes ACTIVATION: hyperventilation and photic stimulation  CLINICAL INFORMATION: 38 year old male with seizure vs syncope.  FINDINGS: Posterior dominant background rhythms, which attenuate with eye opening, ranging 10-11 hertz and 10-15 microvolts. No focal, lateralizing, epileptiform activity or seizures are seen. Patient recorded in the awake and drowsy state. EKG channel shows regular rhythm of 75-80 beats per minute.   IMPRESSION:   Normal EEG in the awake and drowsy states.   INTERPRETING PHYSICIAN:  Suanne Marker, MD Certified in Neurology, Neurophysiology and Neuroimaging  [facility] [address], [address] [place], [place] [address] [phone]

## 2023-03-05 NOTE — Progress Notes (Signed)
 Normal EEG. -VRP

## 2023-05-13 ENCOUNTER — Encounter: Payer: Self-pay | Admitting: Family Medicine

## 2023-05-13 ENCOUNTER — Ambulatory Visit: Payer: BC Managed Care – PPO | Admitting: Family Medicine

## 2023-05-13 NOTE — Progress Notes (Deleted)
 Name: Mansur Sandusky   MRN: [id number]    DOB: 1985/12/16   Date:05/13/2023       Progress Note  No chief complaint on file.    Subjective:   Beowulf Repetti is a 38 y.o. male, presents to clinic for routine follow up on chronic conditions  Hx of elevated platelets, high cholesterol and HS all of which he is not on meds for currently  Anxiety recently worsened and we restarted meds and referred to psychiatry    11/26/2022   11:18 AM 08/26/2022    1:24 PM 08/18/2022    8:11 AM  Depression screen PHQ 2/9  Decreased Interest 0 0 0  Down, Depressed, Hopeless 0 0 0  PHQ - 2 Score 0 0 0  Altered sleeping 0 0 0  Tired, decreased energy 0 0 0  Change in appetite 0 0 0  Feeling bad or failure about yourself  0 0 0  Trouble concentrating 0 0 0  Moving slowly or fidgety/restless 0 0 0  Suicidal thoughts 0 0 0  PHQ-9 Score 0 0 0  Difficult doing work/chores Not difficult at all Not difficult at all Not difficult at all      02/13/2023    3:28 PM 08/26/2022    1:25 PM 07/11/2021    2:16 PM 02/07/2021    9:38 AM  GAD 7 : Generalized Anxiety Score  Nervous, Anxious, on Edge 3 3 0 0  Control/stop worrying 3 3 0 0  Worry too much - different things 3 3 0 0  Trouble relaxing 3 3 0 0  Restless 3 3 0 0  Easily annoyed or irritable 1 0 0 0  Afraid - awful might happen 3 0 0 0  Total GAD 7 Score 19 15 0 0  Anxiety Difficulty  Very difficult Not difficult at all Not difficult at all       Current Outpatient Medications:    citalopram  (CELEXA ) 20 MG tablet, Take 0.5 tablets (10 mg total) by mouth daily for 14 days, THEN 1 tablet (20 mg total) daily., Disp: 77 tablet, Rfl: 0   meclizine (ANTIVERT) 25 MG tablet, Take 25 mg by mouth 3 (three) times daily as needed., Disp: , Rfl:    SUMAtriptan  (IMITREX ) 50 MG tablet, Take 0.5-2 tablets (25-100 mg total) by mouth at HA/vertigo episode onset. May repeat in 2 hours if headache /sx persists or recurs.  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   Vertigo 11/27/2022   Syncope 11/27/2022   Abnormal transaminases 08/04/2019   Psychophysiological insomnia 08/04/2019   Anxiety disorder 08/04/2019   Hyperlipidemia 05/24/2019   Myalgia due to statin 05/24/2019   Immunocompromised state due to drug therapy (HCC) 01/24/2019   Cervical lymphadenopathy 01/24/2019   Chronic rhinitis 01/24/2019   Class 1 obesity due to excess calories with serious comorbidity and body mass index (BMI) of 32.0 to 32.9 in adult 09/17/2018   Elevated LFTs 09/17/2018   Hypertriglyceridemia 09/17/2018   History of nephrectomy, right 09/17/2018   Hidradenitis suppurativa 09/17/2018   Thrombocytosis 09/17/2018    Past Surgical History:  Procedure Laterality Date   HERNIA REPAIR     KIDNEY SURGERY     NEPHRECTOMY LIVING DONOR Right 2009   TONSILLECTOMY     TYMPANOSTOMY TUBE PLACEMENT      Family History  Problem Relation Age of  Onset   Hyperlipidemia Mother    Hypertension Mother    Other Mother        thrombocytosis   Hyperlipidemia Father    Hypertension Father    Epilepsy Sister    Hypertension Sister    Epilepsy Son    Heart failure Maternal Grandmother    Diabetes Maternal Grandfather    Heart failure Maternal Grandfather    Other Maternal Grandfather        thrombocytosis   Diabetes Paternal Grandmother    Hypertension Paternal Grandfather    Prostate cancer Paternal Grandfather     Social History   Tobacco Use   Smoking status: Former    Current packs/day: 0.50    Average packs/day: 0.5 packs/day for 15.0 years (7.5 ttl pk-yrs)    Types: Cigarettes   Smokeless tobacco: Never  Vaping Use   Vaping status: Every Day   Substances: Nicotine, Flavoring  Substance Use Topics   Alcohol use: Yes    Comment: Rarely - 1 drink < 1x/mo   Drug use: Yes    Types: Marijuana    Comment: every morning (works nights, has a  blunt prior to sleep in the AM)     No Known Allergies  Health Maintenance  Topic Date Due   COVID-19 Vaccine (3 - Moderna risk series) 05/24/2019   Pneumococcal Vaccine 8-56 Years old (1 of 2 - PCV) 02/13/2024 (Originally 08/04/2002)   INFLUENZA VACCINE  08/07/2023   DTaP/Tdap/Td (2 - Td or Tdap) 12/16/2028   Hepatitis C Screening  Completed   HIV Screening  Completed   HPV VACCINES  Aged Out   Meningococcal B Vaccine  Aged Out    Chart Review Today: I personally reviewed active problem list, medication list, allergies, family history, social history, health maintenance, notes from last encounter, lab results, imaging with the patient/caregiver today.   Review of Systems  All other systems reviewed and are negative.    Objective:   There were no vitals filed for this visit.  There is no height or weight on file to calculate BMI.  Physical Exam Vitals and nursing note reviewed.  Constitutional:      General: He is not in acute distress.    Appearance: Normal appearance. He is well-developed. He is diaphoretic. He is not ill-appearing or toxic-appearing.  HENT:     Head: Normocephalic and atraumatic.     Nose: Nose normal.  Eyes:     General:        Right eye: No discharge.        Left eye: No discharge.     Conjunctiva/sclera: Conjunctivae normal.  Neck:     Trachea: No tracheal deviation.  Cardiovascular:     Rate and Rhythm: Normal rate and regular rhythm.     Pulses: Normal pulses.     Heart sounds: Normal heart sounds.  Pulmonary:     Effort: Pulmonary effort is normal. No respiratory distress.     Breath sounds: Normal breath sounds. No stridor.  Skin:    General: Skin is warm.     Findings: No rash.  Neurological:     Mental Status: He is alert.     Motor: No abnormal muscle tone.     Coordination: Coordination normal.  Psychiatric:        Attention and Perception: Attention normal.        Mood and Affect: Mood is anxious. Mood is not depressed or  elated. Affect is not labile, blunt, flat, angry, tearful  or inappropriate.        Speech: Speech normal.        Behavior: Behavior normal. Behavior is cooperative.        Results for orders placed or performed during the hospital encounter of 11/12/22  Basic metabolic panel   Collection Time: 11/12/22  6:15 PM  Result Value Ref Range   Sodium 135 135 - 145 mmol/L   Potassium 3.5 3.5 - 5.1 mmol/L   Chloride 104 98 - 111 mmol/L   CO2 21 (L) 22 - 32 mmol/L   Glucose, Bld 98 70 - 99 mg/dL   BUN 16 6 - 20 mg/dL   Creatinine, Ser 1.61 0.61 - 1.24 mg/dL   Calcium  8.9 8.9 - 10.3 mg/dL   GFR, Estimated >09 >60 mL/min   Anion gap 10 5 - 15  CBC   Collection Time: 11/12/22  6:15 PM  Result Value Ref Range   WBC 10.8 (H) 4.0 - 10.5 K/uL   RBC 5.20 4.22 - 5.81 MIL/uL   Hemoglobin 15.9 13.0 - 17.0 g/dL   HCT 45.4 09.8 - 11.9 %   MCV 88.3 80.0 - 100.0 fL   MCH 30.6 26.0 - 34.0 pg   MCHC 34.6 30.0 - 36.0 g/dL   RDW 14.7 82.9 - 56.2 %   Platelets 439 (H) 150 - 400 K/uL   nRBC 0.0 0.0 - 0.2 %  Urinalysis, Routine w reflex microscopic -Urine, Clean Catch   Collection Time: 11/12/22  6:17 PM  Result Value Ref Range   Color, Urine YELLOW (A) YELLOW   APPearance HAZY (A) CLEAR   Specific Gravity, Urine 1.025 1.005 - 1.030   pH 5.0 5.0 - 8.0   Glucose, UA NEGATIVE NEGATIVE mg/dL   Hgb urine dipstick NEGATIVE NEGATIVE   Bilirubin Urine NEGATIVE NEGATIVE   Ketones, ur NEGATIVE NEGATIVE mg/dL   Protein, ur 130 (A) NEGATIVE mg/dL   Nitrite NEGATIVE NEGATIVE   Leukocytes,Ua NEGATIVE NEGATIVE   RBC / HPF 0-5 0 - 5 RBC/hpf   WBC, UA 0-5 0 - 5 WBC/hpf   Bacteria, UA RARE (A) NONE SEEN   Squamous Epithelial / HPF 0 0 - 5 /HPF   Mucus PRESENT       Assessment & Plan:   There are no diagnoses linked to this encounter.   No follow-ups on file.   Adeline Hone, PA-C 05/13/23 2:29 PM

## 2023-05-15 ENCOUNTER — Ambulatory Visit: Admitting: Family Medicine

## 2023-05-29 ENCOUNTER — Ambulatory Visit (INDEPENDENT_AMBULATORY_CARE_PROVIDER_SITE_OTHER): Admitting: Family Medicine

## 2023-05-29 DIAGNOSIS — Z91199 Patient's noncompliance with other medical treatment and regimen due to unspecified reason: Secondary | ICD-10-CM

## 2023-05-29 NOTE — Progress Notes (Signed)
 Name: Mario Boyd   MRN: [id number]    DOB: 1985/05/18   Date:05/29/2023       Progress Note  No chief complaint on file. Pt did not show for f/up appt   Subjective:   Mario Boyd is a 38 y.o. male, presents to clinic for routine follow up on anxiety sx after restarting citalopram  which was helpful for him in the past     11/26/2022   11:18 AM 08/26/2022    1:24 PM 08/18/2022    8:11 AM  Depression screen PHQ 2/9  Decreased Interest 0 0 0  Down, Depressed, Hopeless 0 0 0  PHQ - 2 Score 0 0 0  Altered sleeping 0 0 0  Tired, decreased energy 0 0 0  Change in appetite 0 0 0  Feeling bad or failure about yourself  0 0 0  Trouble concentrating 0 0 0  Moving slowly or fidgety/restless 0 0 0  Suicidal thoughts 0 0 0  PHQ-9 Score 0 0 0  Difficult doing work/chores Not difficult at all Not difficult at all Not difficult at all      02/13/2023    3:28 PM 08/26/2022    1:25 PM 07/11/2021    2:16 PM 02/07/2021    9:38 AM  GAD 7 : Generalized Anxiety Score  Nervous, Anxious, on Edge 3 3 0 0  Control/stop worrying 3 3 0 0  Worry too much - different things 3 3 0 0  Trouble relaxing 3 3 0 0  Restless 3 3 0 0  Easily annoyed or irritable 1 0 0 0  Afraid - awful might happen 3 0 0 0  Total GAD 7 Score 19 15 0 0  Anxiety Difficulty  Very difficult Not difficult at all Not difficult at all         Current Outpatient Medications:    citalopram  (CELEXA ) 20 MG tablet, Take 0.5 tablets (10 mg total) by mouth daily for 14 days, THEN 1 tablet (20 mg total) daily., Disp: 77 tablet, Rfl: 0   meclizine (ANTIVERT) 25 MG tablet, Take 25 mg by mouth 3 (three) times daily as needed., Disp: , Rfl:    SUMAtriptan  (IMITREX ) 50 MG tablet, Take 0.5-2 tablets (25-100 mg total) by mouth at HA/vertigo episode onset. May repeat in 2 hours if headache /sx persists or recurs.  Max 200 mg dose in 24 hours, Disp: 10 tablet, Rfl: 1  Patient Active Problem List   Diagnosis Date Noted   Attention deficit  disorder of childhood 02/13/2023   Vertigo 11/27/2022   Syncope 11/27/2022   Abnormal transaminases 08/04/2019   Psychophysiological insomnia 08/04/2019   Anxiety disorder 08/04/2019   Hyperlipidemia 05/24/2019   Myalgia due to statin 05/24/2019   Cervical lymphadenopathy 01/24/2019   Chronic rhinitis 01/24/2019   Class 1 obesity due to excess calories with serious comorbidity and body mass index (BMI) of 32.0 to 32.9 in adult 09/17/2018   Elevated LFTs 09/17/2018   Hypertriglyceridemia 09/17/2018   History of nephrectomy, right 09/17/2018   Hidradenitis suppurativa 09/17/2018   Thrombocytosis 09/17/2018    Past Surgical History:  Procedure Laterality Date   HERNIA REPAIR     KIDNEY SURGERY     NEPHRECTOMY LIVING DONOR Right 2009   TONSILLECTOMY     TYMPANOSTOMY TUBE PLACEMENT      Family History  Problem Relation Age of Onset   Hyperlipidemia Mother    Hypertension Mother    Other Mother  thrombocytosis   Hyperlipidemia Father    Hypertension Father    Epilepsy Sister    Hypertension Sister    Epilepsy Son    Heart failure Maternal Grandmother    Diabetes Maternal Grandfather    Heart failure Maternal Grandfather    Other Maternal Grandfather        thrombocytosis   Diabetes Paternal Grandmother    Hypertension Paternal Grandfather    Prostate cancer Paternal Grandfather     Social History   Tobacco Use   Smoking status: Former    Current packs/day: 0.50    Average packs/day: 0.5 packs/day for 15.0 years (7.5 ttl pk-yrs)    Types: Cigarettes   Smokeless tobacco: Never  Vaping Use   Vaping status: Every Day   Substances: Nicotine, Flavoring  Substance Use Topics   Alcohol use: Yes    Comment: Rarely - 1 drink < 1x/mo   Drug use: Yes    Types: Marijuana    Comment: every morning (works nights, has a blunt prior to sleep in the AM)     No Known Allergies  Health Maintenance  Topic Date Due   COVID-19 Vaccine (3 - Moderna risk series)  05/24/2019   Pneumococcal Vaccine 49-49 Years old (1 of 2 - PCV) 02/13/2024 (Originally 08/04/2002)   INFLUENZA VACCINE  08/07/2023   DTaP/Tdap/Td (2 - Td or Tdap) 12/16/2028   Hepatitis C Screening  Completed   HIV Screening  Completed   HPV VACCINES  Aged Out   Meningococcal B Vaccine  Aged Out    Chart Review Today:  Review of Systems   Objective:   There were no vitals filed for this visit.  There is no height or weight on file to calculate BMI.  Physical Exam   Functional Status Survey:   Results for orders placed or performed during the hospital encounter of 11/12/22  Basic metabolic panel   Collection Time: 11/12/22  6:15 PM  Result Value Ref Range   Sodium 135 135 - 145 mmol/L   Potassium 3.5 3.5 - 5.1 mmol/L   Chloride 104 98 - 111 mmol/L   CO2 21 (L) 22 - 32 mmol/L   Glucose, Bld 98 70 - 99 mg/dL   BUN 16 6 - 20 mg/dL   Creatinine, Ser 1.61 0.61 - 1.24 mg/dL   Calcium  8.9 8.9 - 10.3 mg/dL   GFR, Estimated >09 >60 mL/min   Anion gap 10 5 - 15  CBC   Collection Time: 11/12/22  6:15 PM  Result Value Ref Range   WBC 10.8 (H) 4.0 - 10.5 K/uL   RBC 5.20 4.22 - 5.81 MIL/uL   Hemoglobin 15.9 13.0 - 17.0 g/dL   HCT 45.4 09.8 - 11.9 %   MCV 88.3 80.0 - 100.0 fL   MCH 30.6 26.0 - 34.0 pg   MCHC 34.6 30.0 - 36.0 g/dL   RDW 14.7 82.9 - 56.2 %   Platelets 439 (H) 150 - 400 K/uL   nRBC 0.0 0.0 - 0.2 %  Urinalysis, Routine w reflex microscopic -Urine, Clean Catch   Collection Time: 11/12/22  6:17 PM  Result Value Ref Range   Color, Urine YELLOW (A) YELLOW   APPearance HAZY (A) CLEAR   Specific Gravity, Urine 1.025 1.005 - 1.030   pH 5.0 5.0 - 8.0   Glucose, UA NEGATIVE NEGATIVE mg/dL   Hgb urine dipstick NEGATIVE NEGATIVE   Bilirubin Urine NEGATIVE NEGATIVE   Ketones, ur NEGATIVE NEGATIVE mg/dL   Protein, ur 130 (  A) NEGATIVE mg/dL   Nitrite NEGATIVE NEGATIVE   Leukocytes,Ua NEGATIVE NEGATIVE   RBC / HPF 0-5 0 - 5 RBC/hpf   WBC, UA 0-5 0 - 5 WBC/hpf    Bacteria, UA RARE (A) NONE SEEN   Squamous Epithelial / HPF 0 0 - 5 /HPF   Mucus PRESENT       Assessment & Plan:   No-show for appointment     No follow-ups on file.   Adeline Hone, PA-C 05/29/23 1:33 PM

## 2023-06-30 ENCOUNTER — Other Ambulatory Visit: Payer: Self-pay

## 2023-06-30 DIAGNOSIS — S62627A Displaced fracture of medial phalanx of left little finger, initial encounter for closed fracture: Secondary | ICD-10-CM | POA: Insufficient documentation

## 2023-06-30 DIAGNOSIS — S62629A Displaced fracture of medial phalanx of unspecified finger, initial encounter for closed fracture: Secondary | ICD-10-CM | POA: Diagnosis not present

## 2023-06-30 DIAGNOSIS — J45909 Unspecified asthma, uncomplicated: Secondary | ICD-10-CM | POA: Insufficient documentation

## 2023-06-30 DIAGNOSIS — W230XXA Caught, crushed, jammed, or pinched between moving objects, initial encounter: Secondary | ICD-10-CM | POA: Diagnosis not present

## 2023-06-30 DIAGNOSIS — S6992XA Unspecified injury of left wrist, hand and finger(s), initial encounter: Secondary | ICD-10-CM | POA: Diagnosis not present

## 2023-06-30 DIAGNOSIS — R9431 Abnormal electrocardiogram [ECG] [EKG]: Secondary | ICD-10-CM | POA: Diagnosis not present

## 2023-06-30 LAB — CBC WITH DIFFERENTIAL/PLATELET
Abs Immature Granulocytes: 0.07 10*3/uL (ref 0.00–0.07)
Basophils Absolute: 0.1 10*3/uL (ref 0.0–0.1)
Basophils Relative: 1 %
Eosinophils Absolute: 0.2 10*3/uL (ref 0.0–0.5)
Eosinophils Relative: 2 %
HCT: 42.9 % (ref 39.0–52.0)
Hemoglobin: 15.1 g/dL (ref 13.0–17.0)
Immature Granulocytes: 1 %
Lymphocytes Relative: 46 %
Lymphs Abs: 6 10*3/uL — ABNORMAL HIGH (ref 0.7–4.0)
MCH: 30.6 pg (ref 26.0–34.0)
MCHC: 35.2 g/dL (ref 30.0–36.0)
MCV: 86.8 fL (ref 80.0–100.0)
Monocytes Absolute: 0.9 10*3/uL (ref 0.1–1.0)
Monocytes Relative: 7 %
Neutro Abs: 5.5 10*3/uL (ref 1.7–7.7)
Neutrophils Relative %: 43 %
Platelets: 510 10*3/uL — ABNORMAL HIGH (ref 150–400)
RBC: 4.94 MIL/uL (ref 4.22–5.81)
RDW: 12.6 % (ref 11.5–15.5)
WBC: 12.7 10*3/uL — ABNORMAL HIGH (ref 4.0–10.5)
nRBC: 0 % (ref 0.0–0.2)

## 2023-06-30 NOTE — ED Triage Notes (Addendum)
 Pt reports he smashed his finger in a door frame tonight at work, pt reports after this he had LOC. Pt reports he has hx of seizures and states he did notice he had urinated on hisself after he had LOC. Pts left pinky finger bruised and swollen. Pt reports he \\feels  nauseous and lightheaded. Pt reports he does not take medications for his seizures.

## 2023-07-01 ENCOUNTER — Emergency Department
Admission: EM | Admit: 2023-07-01 | Discharge: 2023-07-01 | Disposition: A | Discharge status: Home / Self Care | Attending: Emergency Medicine | Admitting: Emergency Medicine

## 2023-07-01 ENCOUNTER — Emergency Department

## 2023-07-01 DIAGNOSIS — S62627A Displaced fracture of medial phalanx of left little finger, initial encounter for closed fracture: Secondary | ICD-10-CM | POA: Diagnosis not present

## 2023-07-01 DIAGNOSIS — S62629A Displaced fracture of medial phalanx of unspecified finger, initial encounter for closed fracture: Secondary | ICD-10-CM

## 2023-07-01 LAB — COMPREHENSIVE METABOLIC PANEL WITH GFR
ALT: 50 U/L — ABNORMAL HIGH (ref 0–44)
AST: 30 U/L (ref 15–41)
Albumin: 4.4 g/dL (ref 3.5–5.0)
Alkaline Phosphatase: 68 U/L (ref 38–126)
Anion gap: 13 (ref 5–15)
BUN: 17 mg/dL (ref 6–20)
CO2: 19 mmol/L — ABNORMAL LOW (ref 22–32)
Calcium: 9.5 mg/dL (ref 8.9–10.3)
Chloride: 101 mmol/L (ref 98–111)
Creatinine, Ser: 1.05 mg/dL (ref 0.61–1.24)
GFR, Estimated: >60 mL/min (ref >60)
Glucose, Bld: 101 mg/dL — ABNORMAL HIGH (ref 70–99)
Potassium: 3.4 mmol/L — ABNORMAL LOW (ref 3.5–5.1)
Sodium: 133 mmol/L — ABNORMAL LOW (ref 135–145)
Total Bilirubin: 0.8 mg/dL (ref 0.0–1.2)
Total Protein: 8 g/dL (ref 6.5–8.1)

## 2023-07-01 NOTE — ED Provider Notes (Signed)
 [facility] Provider Note    Event Date/Time   First MD Initiated Contact with Patient 07/01/23 0235     (approximate)   History   Chief Complaint: Loss of Consciousness and Finger Injury   HPI  Mario Boyd is a 38 y.o. male with a history of recurrent syncope, migraines, asthma who reports being in usual state of health when he inadvertently injured his right fifth finger by smashing it in a door.  Afterward he did have episode of syncope which is not unusual for him.  Syncope has been extensively worked up by primary care.  Only concern currently is the finger pain        Past Medical History:  Diagnosis Date   Asthma    childhood   Hidradenitis suppurativa    Hyperlipidemia    Migraines    Seizures (HCC)    a. as a child - last age ~ 22   Vasovagal syncope    a. as child up to age 61 - usually w/ activity.    Current Outpatient Rx   Order #: [id number] Class: Normal   Order #: [id number] Class: Historical Med   Order #: [id number] Class: Normal    Past Surgical History:  Procedure Laterality Date   HERNIA REPAIR     KIDNEY SURGERY     NEPHRECTOMY LIVING DONOR Right 2009   TONSILLECTOMY     TYMPANOSTOMY TUBE PLACEMENT      Physical Exam   Triage Vital Signs: ED Triage Vitals  Encounter Vitals Group     BP 06/30/23 2345 (!) 161/97     Girls Systolic BP Percentile --      Girls Diastolic BP Percentile --      Boys Systolic BP Percentile --      Boys Diastolic BP Percentile --      Pulse Rate 06/30/23 2345 88     Resp 06/30/23 2345 18     Temp 06/30/23 2345 97.8 F (36.6 C)     Temp src --      SpO2 06/30/23 2345 100 %     Weight 06/30/23 2344 200 lb (90.7 kg)     Height 06/30/23 2344 5' 9 (1.753 m)     Head Circumference --      Peak Flow --      Pain Score 06/30/23 2344 6     Pain Loc --      Pain Education --      Exclude from Growth Chart --     Most recent vital signs: Vitals:   06/30/23 2345  BP: (!) 161/97   Pulse: 88  Resp: 18  Temp: 97.8 F (36.6 C)  SpO2: 100%    General: Awake, no distress.  CV:  Good peripheral perfusion.  Normal cap refill Resp:  Normal effort.  Abd:  No distention.  Other:  Pain and swelling at the left fifth finger, tenderness over middle phalanx   ED Results / Procedures / Treatments   Labs (all labs ordered are listed, but only abnormal results are displayed) Labs Reviewed  CBC WITH DIFFERENTIAL/PLATELET - Abnormal; Notable for the following components:      Result Value   WBC 12.7 (*)    Platelets 510 (*)    Lymphs Abs 6.0 (*)    All other components within normal limits  COMPREHENSIVE METABOLIC PANEL WITH GFR - Abnormal; Notable for the following components:   Sodium 133 (*)    Potassium 3.4 (*)    CO2  19 (*)    Glucose, Bld 101 (*)    ALT 50 (*)    All other components within normal limits     EKG Interpreted by me Sinus rhythm rate 98.  Normal axis and intervals.  Normal QRS ST segments T waves   RADIOLOGY X-ray left fifth finger interpreted by me, shows displaced fracture of middle phalanx.  Radiology report reviewed.   PROCEDURES:  Procedures   MEDICATIONS ORDERED IN ED: Medications - No data to display   IMPRESSION / MDM / ASSESSMENT AND PLAN / ED COURSE  I reviewed the triage vital signs and the nursing notes.  DDx: Finger fracture, anemia, AKI, electrolyte derangement  Patient's presentation is most consistent with acute presentation with potential threat to life or bodily function.  Patient presents with accidental injury.  X-ray shows fracture.  This was splinted with aluminum.  Labs unremarkable, doubt acute cardiac or neurologic event.  Stable for discharge       FINAL CLINICAL IMPRESSION(S) / ED DIAGNOSES   Final diagnoses:  Closed displaced fracture of middle phalanx of finger, initial encounter     Rx / DC Orders   ED Discharge Orders     None        Note:  This document was prepared using  Dragon voice recognition software and may include unintentional dictation errors.   Viviann Pastor, MD 07/01/23 [phone]

## 2023-07-02 DIAGNOSIS — S62627A Displaced fracture of medial phalanx of left little finger, initial encounter for closed fracture: Secondary | ICD-10-CM | POA: Diagnosis not present

## 2023-07-06 ENCOUNTER — Telehealth: Payer: BC Managed Care – PPO | Admitting: Diagnostic Neuroimaging

## 2023-07-09 DIAGNOSIS — S62627D Displaced fracture of medial phalanx of left little finger, subsequent encounter for fracture with routine healing: Secondary | ICD-10-CM | POA: Diagnosis not present

## 2023-07-16 ENCOUNTER — Encounter: Payer: Self-pay | Admitting: Nurse Practitioner

## 2023-07-16 ENCOUNTER — Ambulatory Visit: Attending: Nurse Practitioner | Admitting: Nurse Practitioner

## 2023-07-16 VITALS — BP 115/80 | HR 76 | Ht 69.0 in | Wt 198.0 lb

## 2023-07-16 DIAGNOSIS — Z720 Tobacco use: Secondary | ICD-10-CM

## 2023-07-16 DIAGNOSIS — R55 Syncope and collapse: Secondary | ICD-10-CM | POA: Diagnosis not present

## 2023-07-16 NOTE — Progress Notes (Signed)
 Office Visit    Patient Name: Mario Boyd Date of Encounter: 07/16/2023  Primary Care Provider:  Leavy Mole, PA-C Primary Cardiologist:  None  Cardiology APP:  Vivienne Lonni Ingle, NP   Chief Complaint    38 y.o. male with a history of presyncope and syncope, hyperlipidemia with statin intolerance, hypertriglyceridemia, LFT abnormalities with subsequent normalization, and anxiety, who presents for follow-up related to syncope.  Past Medical History   Subjective   Past Medical History:  Diagnosis Date   Asthma    childhood   Hidradenitis suppurativa    Hyperlipidemia    Migraines    Seizures (HCC)    a. as a child - last age ~ 89   Vasovagal syncope    a. as child up to age 41 - usually w/ activity.   Past Surgical History:  Procedure Laterality Date   HERNIA REPAIR     KIDNEY SURGERY     NEPHRECTOMY LIVING DONOR Right 2009   TONSILLECTOMY     TYMPANOSTOMY TUBE PLACEMENT      Allergies  No Known Allergies     History of Present Illness      38 y.o. y/o male with a history of presyncope and syncope, hyperlipidemia with statin intolerance, hypertriglyceridemia, migraines, LFT abnormalities with subsequent normalization, and anxiety.  He previously smoked cigarettes for about 15 years and has been vaping over the past several years.  He works night shift and smokes marijuana when he comes home from work in the morning, to help him fall asleep.  As a child, he was diagnosed with seizures which she says he subsequently outgrew.  He was also diagnosed with vasovagal syncope, that typically happened with exercise and sports.  He thinks that his last bout of vasovagal syncope as a child was around age 28, when he was in high school and playing basketball.  In August 2024, he suffered a syncopal spell while at an outdoor golf event.  Symptoms were preceded by GI distress followed by lightheadedness and then loss of consciousness.  He was attended to by family  onlookers and taken to the emergency department where ECG was unremarkable.  Creatinine was mildly elevated at 1.16 with normal troponins.  Head CT showed no acute findings.  He had recurrent syncope in November 2024 while sitting in a meeting that was preceded by pain in his neck and trapezius followed by lightheadedness.  He was noted to slump forward by his coworkers but then quickly regained consciousness.  When he stood to leave the room, he had recurrent syncope and regain consciousness was lying on the floor.  He was again taken to the emergency department where ECG and blood work were unremarkable with exception of mild leukocytosis at 10.8.  History of IV fluids with resolution of symptoms.     Mario Boyd established care here in December 2024, at which time he was not orthostatic by examination.  An echocardiogram and ZIO monitor were ordered with plan for follow-up in 4 to 6 weeks however, patient did not follow through.  He says today that he did wear the Zio but felt returned.  Review of Zio suite today shows that he wore it for 12 days and there were no significant arrhythmias.  He was seen by neurology in late December and underwent EEG which was normal.  On July 01, 2023, he had been doing something outside and accidentally slammed fingers on his right hand in a door.  He says that he looked  at his fingers and saw that 1 was pointed sideways and that he immediate became presyncopal.  He went to sit down but the seat behind him had wheels and rolled out from underneath him.  A coworker helped him to the floor and subsequently told him that he lost consciousness for few seconds.  After regaining consciousness, he presented to the emergency department where he was found to have a displaced fracture of the middle phalanx of the left fifth finger, which was splinted.  Labs showed mild leukocytosis with a white count of 12.7 and also mild hypokalemia with potassium of 3.4.  He has not had any  recurrent presyncope or syncope since June 25.  He notes today that he has missed medical appointments because of significant anxiety.  He denies chest pain, dyspnea, palpitations, PND, orthopnea, edema, or early satiety.  He is interested in pursuing an implantable loop recorder. Objective   Home Medications    Current Outpatient Medications  Medication Sig Dispense Refill   citalopram  (CELEXA ) 20 MG tablet Take 0.5 tablets (10 mg total) by mouth daily for 14 days, THEN 1 tablet (20 mg total) daily. (Patient not taking: No sig reported) 77 tablet 0   meclizine (ANTIVERT) 25 MG tablet Take 25 mg by mouth 3 (three) times daily as needed. (Patient not taking: Reported on 07/16/2023)     SUMAtriptan  (IMITREX ) 50 MG tablet Take 0.5-2 tablets (25-100 mg total) by mouth at HA/vertigo episode onset. May repeat in 2 hours if headache /sx persists or recurs.  Max 200 mg dose in 24 hours (Patient not taking: Reported on 07/16/2023) 10 tablet 1   No current facility-administered medications for this visit.     Physical Exam    VS:  BP 115/80 (BP Location: Left Arm, Patient Position: Sitting, Cuff Size: Normal)   Pulse 76   Ht 5' 9 (1.753 m)   Wt 198 lb (89.8 kg)   SpO2 99%   BMI 29.24 kg/m  , BMI Body mass index is 29.24 kg/m.          GEN: Well nourished, well developed, in no acute distress. HEENT: normal. Neck: Supple, no JVD, carotid bruits, or masses. Cardiac: RRR, no murmurs, rubs, or gallops. No clubbing, cyanosis, edema.  Radials 2+/PT 2+ and equal bilaterally.  Respiratory:  Respirations regular and unlabored, clear to auscultation bilaterally. GI: Soft, nontender, nondistended, BS + x 4. MS: no deformity or atrophy. Skin: warm and dry, no rash. Neuro:  Strength and sensation are intact. Psych: Normal affect.  Accessory Clinical Findings    ECG personally reviewed by me today - EKG Interpretation Date/Time:  Thursday July 16 2023 10:58:32 EDT Ventricular Rate:  76 PR  Interval:  144 QRS Duration:  92 QT Interval:  342 QTC Calculation: 384 R Axis:   51  Text Interpretation: Normal sinus rhythm Normal ECG Confirmed by Vivienne Bruckner [phone]) on 07/16/2023 11:11:11 AM  - no acute changes.  Lab Results  Component Value Date   WBC 12.7 (H) 06/30/2023   HGB 15.1 06/30/2023   HCT 42.9 06/30/2023   MCV 86.8 06/30/2023   PLT 510 (H) 06/30/2023   Lab Results  Component Value Date   CREATININE 1.05 06/30/2023   BUN 17 06/30/2023   NA 133 (L) 06/30/2023   K 3.4 (L) 06/30/2023   CL 101 06/30/2023   CO2 19 (L) 06/30/2023   Lab Results  Component Value Date   ALT 50 (H) 06/30/2023   AST 30 06/30/2023  ALKPHOS 68 06/30/2023   BILITOT 0.8 06/30/2023   Lab Results  Component Value Date   CHOL 209 (H) 08/13/2022   HDL 46 08/13/2022   LDLCALC 128 (H) 08/13/2022   TRIG 213 (H) 08/13/2022   CHOLHDL 4.5 08/13/2022    Lab Results  Component Value Date   HGBA1C 5.5 09/06/2018   Lab Results  Component Value Date   TSH 1.570 05/13/2021       Assessment & Plan    1.  Presyncope/syncope: Patient with a history of vasovagal syncope as a child but then had no events between the ages of 79 and 85.  In December 2024, he had an episode of syncope that occurred in the setting of being outside in the heat during a golf event, when he developed GI distress followed by syncope.  He had a second event in November 2025 when he was sitting in a meeting at work and had pain in his neck and shoulders.  Both occasions, ED evaluation was relatively unremarkable and he felt better with IV fluids.  Most recently, he slammed his hand in a door on June 25.  When he pulled his hand out of the door, he had significant pain but also noted the tip of his index finger was turned sideways.  He immediately felt presyncopal and then suffered syncope, which was witnessed.  He regained consciousness shortly after being laid down on the ground.  Workup was once again relatively  unremarkable with exception of mild leukocytosis and potassium of 3.4.  Patient has had no recurrence of syncope since his ED visit.  He has been trying to hydrate well and does his best to avoid triggers such as pain or excessive heat.  He previously wore a ZIO monitor in December though never mailed the monitor back therefore no final result was ever posted.  I was able to review results on Zio suite today.  There were 3 triggered events though he says 2 were likely accidental in the last 1 occurred when he finally took the device off because it was itchy.  He had 1 episode of lightheadedness without syncope while wearing the monitor.  There were no significant bradycardia or tachyarrhythmias noted on submitted strips, which we will print off and have scanned into his chart today.  He is not willing to follow through with 2D echocardiogram which was previously ordered and December 2024.  He is also interested in speaking with electrophysiology to consider implantable loop recorder in the setting of recurrent syncope, and I will refer today.  2.  Vape usage/marijuana use: Cessation advised.  3.  Disposition: Follow-up echocardiogram.  Follow-up with electrophysiology.  Lonni Meager, NP 07/16/2023, 12:24 PM

## 2023-07-16 NOTE — Patient Instructions (Signed)
 Medication Instructions:  Your physician recommends that you continue on your current medications as directed. Please refer to the Current Medication list given to you today.   *If you need a refill on your cardiac medications before your next appointment, please call your pharmacy*  Lab Work: None ordered at this time  If you have labs (blood work) drawn today and your tests are completely normal, you will receive your results only by: MyChart Message (if you have MyChart) OR A paper copy in the mail If you have any lab test that is abnormal or we need to change your treatment, we will call you to review the results.  Testing/Procedures: Your physician has requested that you have an echocardiogram. Echocardiography is a painless test that uses sound waves to create images of your heart. It provides your doctor with information about the size and shape of your heart and how well your heart's chambers and valves are working.   You may receive an ultrasound enhancing agent through an IV if needed to better visualize your heart during the echo. This procedure takes approximately one hour.  There are no restrictions for this procedure.  This will take place at [address] [facility] [address], [place] [address]  Please note: We ask at that you not bring children with you during ultrasound (echo/ vascular) testing. Due to room size and safety concerns, children are not allowed in the ultrasound rooms during exams. Our front office staff cannot provide observation of children in our lobby area while testing is being conducted. An adult accompanying a patient to their appointment will only be allowed in the ultrasound room at the discretion of the ultrasound technician under special circumstances. We apologize for any inconvenience.   Follow-Up: At [facility], you and your health needs are our priority.  As part of our continuing mission to provide you with exceptional  heart care, our providers are all part of one team.  This team includes your primary Cardiologist (physician) and Advanced Practice Providers or APPs (Physician Assistants and Nurse Practitioners) who all work together to provide you with the care you need, when you need it.  Your next appointment:   1 month(s)  Provider:   Ole Holts, MD or Fonda Kitty

## 2023-08-03 ENCOUNTER — Telehealth: Payer: Self-pay | Admitting: Family Medicine

## 2023-08-03 NOTE — Telephone Encounter (Signed)
 Copied from CRM [id number]. Topic: General - Other >> Aug 03, 2023  4:45 PM Winona R wrote: Pt calling to check the status of his disability forms that were sent over to the office. Please contact the pt to confirm it has been received , not received or ready for pick.

## 2023-08-05 NOTE — Telephone Encounter (Signed)
 Called patient and lvm notifying I received the paperwork this morning. Informed patient Mario Boyd is out of office until 08/13/23 and will review paperwork at that time.

## 2023-08-17 ENCOUNTER — Telehealth: Payer: Self-pay

## 2023-08-17 NOTE — Telephone Encounter (Signed)
 Copied from CRM [id number]. Topic: General - Other >> Aug 17, 2023  3:40 PM Sasha H wrote: Reason for CRM:Pt was calling in to check status of STD paperwork that was dropped off

## 2023-08-19 ENCOUNTER — Ambulatory Visit: Attending: Nurse Practitioner

## 2023-08-19 ENCOUNTER — Telehealth: Admitting: Family Medicine

## 2023-08-19 DIAGNOSIS — R55 Syncope and collapse: Secondary | ICD-10-CM

## 2023-08-19 DIAGNOSIS — F413 Other mixed anxiety disorders: Secondary | ICD-10-CM | POA: Diagnosis not present

## 2023-08-19 DIAGNOSIS — F5104 Psychophysiologic insomnia: Secondary | ICD-10-CM

## 2023-08-19 DIAGNOSIS — F410 Panic disorder [episodic paroxysmal anxiety] without agoraphobia: Secondary | ICD-10-CM

## 2023-08-19 LAB — ECHOCARDIOGRAM COMPLETE
AR max vel: 3.56 cm2
AV Area VTI: 3.64 cm2
AV Area mean vel: 3.14 cm2
AV Mean grad: 4 mmHg
AV Peak grad: 5.6 mmHg
Ao pk vel: 1.18 m/s
Area-P 1/2: 4.49 cm2
S' Lateral: 3.08 cm

## 2023-08-19 MED ORDER — CITALOPRAM HYDROBROMIDE 10 MG PO TABS
10.0000 mg | ORAL_TABLET | Freq: Every day | ORAL | 1 refills | Status: AC
Start: 2023-08-19 — End: ?

## 2023-08-19 NOTE — Patient Instructions (Signed)
 [employer], [place].   P: [phone] F: [phone]

## 2023-08-19 NOTE — Progress Notes (Unsigned)
 Name: Mario Boyd   MRN: [id number]    DOB: [birth year]   Date:08/19/2023       Progress Note  Subjective:    I connected with  Mario Boyd  on 08/19/23 at  2:00 PM EDT by a video enabled telemedicine application and verified that I am speaking with the correct person using two identifiers.  I discussed the limitations of evaluation and management by telemedicine and the availability of in person appointments. The patient expressed understanding and agreed to proceed. Staff also discussed with the patient that there may be a patient responsible charge related to this service. Patient Location: home Provider Location: [facility] clinic office  Additional Individuals present: none  Chief Complaint  Patient presents with   Form Completion    Short Term Disability   Anxiety    Feels like it is worsening since passing out more. Last syncope in July- afraid of passing out all the time    Mario Boyd is a 38 y.o. male, presents for virtual visit for forms and anxiety f/up  Hasn't left his house since he passed out in July   Neurology signed off and found no evidence of seizures  He is still having passing out episodes with fall July 7th syncope event at work and hasn't been to work since Per cardiologist don't drive, don't operate machinery, light duty at work Still pending with cardiology to figure out underlying etiology of syncope   Anxiety sx worse with these events He didn't really take celexa  very long - he didn't note improvement in the few weeks that he was taking it and then had syncope episode and was scared so he stopped the med Previously did not like buspar  Pending new pt appt with psychiatry/psychology  Patient Active Problem List   Diagnosis Date Noted   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   Vertigo 11/27/2022   Syncope 11/27/2022   Abnormal transaminases 08/04/2019   Psychophysiological insomnia 08/04/2019   Anxiety disorder 08/04/2019    Hyperlipidemia 05/24/2019   Myalgia due to statin 05/24/2019   Cervical lymphadenopathy 01/24/2019   Chronic rhinitis 01/24/2019   Class 1 obesity due to excess calories with serious comorbidity and body mass index (BMI) of 32.0 to 32.9 in adult 09/17/2018   Elevated LFTs 09/17/2018   Hypertriglyceridemia 09/17/2018   History of nephrectomy, right 09/17/2018   Hidradenitis suppurativa 09/17/2018   Thrombocytosis 09/17/2018    Current Outpatient Medications:    citalopram  (CELEXA ) 20 MG tablet, Take 0.5 tablets (10 mg total) by mouth daily for 14 days, THEN 1 tablet (20 mg total) daily. (Patient not taking: No sig reported), Disp: 77 tablet, Rfl: 0   meclizine (ANTIVERT) 25 MG tablet, Take 25 mg by mouth 3 (three) times daily as needed. (Patient not taking: Reported on 08/19/2023), Disp: , Rfl:    SUMAtriptan  (IMITREX ) 50 MG tablet, Take 0.5-2 tablets (25-100 mg total) by mouth at HA/vertigo episode onset. May repeat in 2 hours if headache /sx persists or recurs.  Max 200 mg dose in 24 hours (Patient not taking: Reported on 08/19/2023), Disp: 10 tablet, Rfl: 1 No Known Allergies  Past Surgical History:  Procedure Laterality Date   HERNIA REPAIR     KIDNEY SURGERY     NEPHRECTOMY LIVING DONOR Right 2009   TONSILLECTOMY     TYMPANOSTOMY TUBE PLACEMENT     Family History  Problem Relation Age of Onset   Hyperlipidemia Mother    Hypertension Mother    Other Mother  thrombocytosis   Hyperlipidemia Father    Hypertension Father    Epilepsy Sister    Hypertension Sister    Epilepsy Son    Heart failure Maternal Grandmother    Diabetes Maternal Grandfather    Heart failure Maternal Grandfather    Other Maternal Grandfather        thrombocytosis   Diabetes Paternal Grandmother    Hypertension Paternal Grandfather    Prostate cancer Paternal Grandfather    Social History   Socioeconomic History   Marital status: Married    Spouse name: alice   Number of children: 2    Years of education: 12   Highest education level: Associate degree: occupational, Scientist, product/process development, or vocational program  Occupational History   Occupation: unemployed  Tobacco Use   Smoking status: Former    Current packs/day: 0.50    Average packs/day: 0.5 packs/day for 15.0 years (7.5 ttl pk-yrs)    Types: Cigarettes   Smokeless tobacco: Never  Vaping Use   Vaping status: Every Day   Substances: Nicotine, Flavoring  Substance and Sexual Activity   Alcohol use: Yes    Comment: Rarely - 1 drink < 1x/mo   Drug use: Yes    Types: Marijuana    Comment: every morning (works nights, has a blunt prior to sleep in the AM)   Sexual activity: Yes    Comment: wife on birth control, 9 years  Other Topics Concern   Not on file  Social History Narrative   Vape; quit smoking; no alcohol; works at [employer]; in [place]/wife;and 2 children.    Social Drivers of Health   Financial Resource Strain: Medium Risk (08/18/2023)   Overall Financial Resource Strain (CARDIA)    Difficulty of Paying Living Expenses: Somewhat hard  Food Insecurity: No Food Insecurity (08/18/2023)   Hunger Vital Sign    Worried About Running Out of Food in the Last Year: Never true    Ran Out of Food in the Last Year: Never true  Transportation Needs: No Transportation Needs (08/18/2023)   PRAPARE - Administrator, Civil Service (Medical): No    Lack of Transportation (Non-Medical): No  Physical Activity: Inactive (08/18/2023)   Exercise Vital Sign    Days of Exercise per Week: 0 days    Minutes of Exercise per Session: Not on file  Stress: Stress Concern Present (08/18/2023)   [employer] of Occupational Health - Occupational Stress Questionnaire    Feeling of Stress: Very much  Social Connections: Moderately Isolated (08/18/2023)   Social Connection and Isolation Panel    Frequency of Communication with Friends and Family: More than three times a week    Frequency of Social Gatherings with Friends and  Family: Once a week    Attends Religious Services: 1 to 4 times per year    Active Member of [employer] or Organizations: No    Attends Engineer, structural: Not on file    Marital Status: Separated  Intimate Partner Violence: Not At Risk (08/13/2022)   Humiliation, Afraid, Rape, and Kick questionnaire    Fear of Current or Ex-Partner: No    Emotionally Abused: No    Physically Abused: No    Sexually Abused: No    Chart Review Today: I personally reviewed active problem list, medication list, allergies, family history, social history, health maintenance, notes from last encounter, lab results, imaging with the patient/caregiver today.   Review of Systems  Constitutional: Negative.   HENT: Negative.    Eyes: Negative.  Respiratory: Negative.    Cardiovascular: Negative.   Gastrointestinal: Negative.   Endocrine: Negative.   Genitourinary: Negative.   Musculoskeletal: Negative.   Skin: Negative.   Allergic/Immunologic: Negative.   Neurological: Negative.   Hematological: Negative.   Psychiatric/Behavioral: Negative.    All other systems reviewed and are negative.     Objective:    Virtual encounter, vitals limited, only able to obtain the following There were no vitals filed for this visit. There is no height or weight on file to calculate BMI. Nursing Note and Vital Signs reviewed.  Physical Exam Vitals and nursing note reviewed.  Pulmonary:     Effort: No respiratory distress.  Neurological:     Mental Status: He is alert.  Psychiatric:        Mood and Affect: Mood normal.     PE limited by virtual encounter  No results found for this or any previous visit (from the past 72 hours).  PHQ2/9:    08/19/2023    1:46 PM 11/26/2022   11:18 AM 08/26/2022    1:24 PM 08/18/2022    8:11 AM 08/13/2022    3:04 PM  Depression screen PHQ 2/9  Decreased Interest 0 0 0 0 0  Down, Depressed, Hopeless 0 0 0 0 0  PHQ - 2 Score 0 0 0 0 0  Altered sleeping 3 0 0 0 0   Tired, decreased energy 3 0 0 0 0  Change in appetite 0 0 0 0 0  Feeling bad or failure about yourself  0 0 0 0 0  Trouble concentrating 3 0 0 0 0  Moving slowly or fidgety/restless 0 0 0 0 0  Suicidal thoughts 0 0 0 0 0  PHQ-9 Score 9 0 0 0 0  Difficult doing work/chores  Not difficult at all Not difficult at all Not difficult at all Not difficult at all   PHQ-2/9 Result is positive, reviewed with with patient GAD 7 positive - moderate to severe sx    08/19/2023    1:47 PM 02/13/2023    3:28 PM 08/26/2022    1:25 PM 07/11/2021    2:16 PM  GAD 7 : Generalized Anxiety Score  Nervous, Anxious, on Edge 3 3 3  0  Control/stop worrying 3 3 3  0  Worry too much - different things 3 3 3  0  Trouble relaxing 3 3 3  0  Restless 3 3 3  0  Easily annoyed or irritable 0 1 0 0  Afraid - awful might happen 3 3 0 0  Total GAD 7 Score 18 19 15  0  Anxiety Difficulty   Very difficult Not difficult at all      Fall Risk:    11/26/2022   11:17 AM 08/26/2022    1:24 PM 08/18/2022    8:10 AM 08/13/2022    3:04 PM 07/19/2021    3:07 PM  Fall Risk   Falls in the past year? 1 1 0 0 0  Number falls in past yr: 1 0 0 0 0  Injury with Fall? 1 0 0 0 0  Risk for fall due to : Impaired balance/gait Impaired balance/gait No Fall Risks No Fall Risks No Fall Risks  Follow up Falls prevention discussed;Education provided;Falls evaluation completed Falls prevention discussed;Education provided;Falls evaluation completed Falls prevention discussed;Education provided;Falls evaluation completed Falls prevention discussed;Education provided;Falls evaluation completed Education provided;Falls prevention discussed      Data saved with a previous flowsheet row definition     Assessment and Plan:  ICD-10-CM   1. 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 Psychophysiological insomnia  F51.04 Ambulatory referral to Psychiatry    3. Panic attacks  F41.0 Ambulatory referral to  Psychiatry    4. Syncope and collapse  R55         Pt agrees to restart celexa  since it was helpful in the past, he only took for a few weeks as of our last OV and he never est with psych Restart low dose Referral back to psych, pt understand to reach out to them directly and complete paperwork and make new pt appt F/up with me in 6 weeks if not into psych yet to further adjust celexa  med  Forms/paperwork completed as able however pt was told specialists will have to fill out as well and he may need functional assessment to get all of forms and limitations eval and documented and forms cannot be completed entirely from PCP - a lot will refer and defer to specialists recommendations  Will get completed today or tomorrow with admin time and submit  I discussed the assessment and treatment plan with the patient. The patient was provided an opportunity to ask questions and all were answered. The patient agreed with the plan and demonstrated an understanding of the instructions.  The patient was advised to call back or seek an in-person evaluation if the symptoms worsen or if the condition fails to improve as anticipated.  I provided 20+ minutes of non-face-to-face time during this encounter.  Michelene Cower, PA-C 08/19/23 1:59 PM

## 2023-08-19 NOTE — Progress Notes (Unsigned)
 Name: Mario Boyd   MRN: [id number]    DOB: 1985-05-03   Date:08/19/2023       Progress Note  Subjective:    I connected with  Mario Boyd  on 08/19/23 at  2:00 PM EDT by a video enabled telemedicine application and verified that I am speaking with the correct person using two identifiers.  I discussed the limitations of evaluation and management by telemedicine and the availability of in person appointments. The patient expressed understanding and agreed to proceed. Staff also discussed with the patient that there may be a patient responsible charge related to this service. Patient Location: home Provider Location: [facility] clinic office  Additional Individuals present: ***  Chief Complaint  Patient presents with   Form Completion    Short Term Disability   Anxiety    Feels like it is worsening since passing out more. Last syncope in July- afraid of passing out all the time    Mario Boyd is a 38 y.o. 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   Vertigo 11/27/2022   Syncope 11/27/2022   Abnormal transaminases 08/04/2019   Psychophysiological insomnia 08/04/2019   Anxiety disorder 08/04/2019   Hyperlipidemia 05/24/2019   Myalgia due to statin 05/24/2019   Cervical lymphadenopathy 01/24/2019   Chronic rhinitis 01/24/2019   Class 1 obesity due to excess calories with serious comorbidity and body mass index (BMI) of 32.0 to 32.9 in adult 09/17/2018   Elevated LFTs 09/17/2018   Hypertriglyceridemia 09/17/2018   History of nephrectomy, right 09/17/2018    Hidradenitis suppurativa 09/17/2018   Thrombocytosis 09/17/2018    Current Outpatient Medications:    citalopram  (CELEXA ) 20 MG tablet, Take 0.5 tablets (10 mg total) by mouth daily for 14 days, THEN 1 tablet (20 mg total) daily. (Patient not taking: No sig reported), Disp: 77 tablet, Rfl: 0   meclizine (ANTIVERT) 25 MG tablet, Take 25 mg by mouth 3 (three) times daily as needed. (Patient not taking: Reported on 08/19/2023), Disp: , Rfl:    SUMAtriptan  (IMITREX ) 50 MG tablet, Take 0.5-2 tablets (25-100 mg total) by mouth at HA/vertigo episode onset. May repeat in 2 hours if headache /sx persists or recurs.  Max 200 mg dose in 24 hours (Patient not taking: Reported on 08/19/2023), Disp: 10 tablet, Rfl: 1 No Known Allergies  Past Surgical History:  Procedure Laterality Date   HERNIA REPAIR     KIDNEY SURGERY     NEPHRECTOMY LIVING DONOR Right 2009   TONSILLECTOMY     TYMPANOSTOMY TUBE PLACEMENT     Family History  Problem Relation Age of Onset   Hyperlipidemia Mother    Hypertension Mother    Other Mother        thrombocytosis   Hyperlipidemia Father    Hypertension Father    Epilepsy Sister    Hypertension Sister    Epilepsy Son    Heart failure Maternal Grandmother    Diabetes Maternal Grandfather    Heart failure Maternal Grandfather    Other Maternal Grandfather  thrombocytosis   Diabetes Paternal Grandmother    Hypertension Paternal Grandfather    Prostate cancer Paternal Grandfather    Social History   Socioeconomic History   Marital status: Married    Spouse name: alice   Number of children: 2   Years of education: 12   Highest education level: Associate degree: occupational, Scientist, product/process development, or vocational program  Occupational History   Occupation: unemployed  Tobacco Use   Smoking status: Former    Current packs/day: 0.50    Average packs/day: 0.5 packs/day for 15.0 years (7.5 ttl pk-yrs)    Types: Cigarettes   Smokeless tobacco: Never  Vaping Use    Vaping status: Every Day   Substances: Nicotine, Flavoring  Substance and Sexual Activity   Alcohol use: Yes    Comment: Rarely - 1 drink < 1x/mo   Drug use: Yes    Types: Marijuana    Comment: every morning (works nights, has a blunt prior to sleep in the AM)   Sexual activity: Yes    Comment: wife on birth control, 9 years  Other Topics Concern   Not on file  Social History Narrative   Vape; quit smoking; no alcohol; works at [employer]; in [place]/wife;and 2 children.    Social Drivers of Health   Financial Resource Strain: Medium Risk (08/18/2023)   Overall Financial Resource Strain (CARDIA)    Difficulty of Paying Living Expenses: Somewhat hard  Food Insecurity: No Food Insecurity (08/18/2023)   Hunger Vital Sign    Worried About Running Out of Food in the Last Year: Never true    Ran Out of Food in the Last Year: Never true  Transportation Needs: No Transportation Needs (08/18/2023)   PRAPARE - Administrator, Civil Service (Medical): No    Lack of Transportation (Non-Medical): No  Physical Activity: Inactive (08/18/2023)   Exercise Vital Sign    Days of Exercise per Week: 0 days    Minutes of Exercise per Session: Not on file  Stress: Stress Concern Present (08/18/2023)   [employer] of Occupational Health - Occupational Stress Questionnaire    Feeling of Stress: Very much  Social Connections: Moderately Isolated (08/18/2023)   Social Connection and Isolation Panel    Frequency of Communication with Friends and Family: More than three times a week    Frequency of Social Gatherings with Friends and Family: Once a week    Attends Religious Services: 1 to 4 times per year    Active Member of [employer] or Organizations: No    Attends Engineer, structural: Not on file    Marital Status: Separated  Intimate Partner Violence: Not At Risk (08/13/2022)   Humiliation, Afraid, Rape, and Kick questionnaire    Fear of Current or Ex-Partner: No    Emotionally  Abused: No    Physically Abused: No    Sexually Abused: No    Chart Review Today: ***  Review of Systems    Objective:    Virtual encounter, vitals limited, only able to obtain the following There were no vitals filed for this visit. There is no height or weight on file to calculate BMI. Nursing Note and Vital Signs reviewed.  Physical Exam  PE limited by telephone encounter  No results found for this or any previous visit (from the past 72 hours).  PHQ2/9:    08/19/2023    1:46 PM 11/26/2022   11:18 AM 08/26/2022    1:24 PM 08/18/2022    8:11 AM 08/13/2022  3:04 PM  Depression screen PHQ 2/9  Decreased Interest 0 0 0 0 0  Down, Depressed, Hopeless 0 0 0 0 0  PHQ - 2 Score 0 0 0 0 0  Altered sleeping 3 0 0 0 0  Tired, decreased energy 3 0 0 0 0  Change in appetite 0 0 0 0 0  Feeling bad or failure about yourself  0 0 0 0 0  Trouble concentrating 3 0 0 0 0  Moving slowly or fidgety/restless 0 0 0 0 0  Suicidal thoughts 0 0 0 0 0  PHQ-9 Score 9 0 0 0 0  Difficult doing work/chores  Not difficult at all Not difficult at all Not difficult at all Not difficult at all   PHQ-2/9 Result is ***  Fall Risk:    11/26/2022   11:17 AM 08/26/2022    1:24 PM 08/18/2022    8:10 AM 08/13/2022    3:04 PM 07/19/2021    3:07 PM  Fall Risk   Falls in the past year? 1 1 0 0 0  Number falls in past yr: 1 0 0 0 0  Injury with Fall? 1 0 0 0 0  Risk for fall due to : Impaired balance/gait Impaired balance/gait No Fall Risks No Fall Risks No Fall Risks  Follow up Falls prevention discussed;Education provided;Falls evaluation completed Falls prevention discussed;Education provided;Falls evaluation completed Falls prevention discussed;Education provided;Falls evaluation completed Falls prevention discussed;Education provided;Falls evaluation completed Education provided;Falls prevention discussed      Data saved with a previous flowsheet row definition     Assessment and Plan:   No  diagnosis found.    Pt agrees to restart celexa  since it was helpful in the past, he only took for a few weeks as of our last OV and he never est with psych Restart low dose Referral back to psych, pt understand to reach out to them directly and complete paperwork and make new pt appt F/up with me in 6 weeks if not into psych yet to further adjust celexa  med  Forms/paperwork completed as able however pt was told specialists will have to fill out as well and he may need functional assessment to get all of forms and limitations eval and documented and forms cannot be completed entirely from PCP - a lot will refer and defer to specialists recommendations  Will get completed today or tomorrow with admin time and submit  I discussed the assessment and treatment plan with the patient. The patient was provided an opportunity to ask questions and all were answered. The patient agreed with the plan and demonstrated an understanding of the instructions.  The patient was advised to call back or seek an in-person evaluation if the symptoms worsen or if the condition fails to improve as anticipated.  I provided *** minutes of non-face-to-face time during this encounter.  Michelene Cower, PA-C 08/19/23 1:59 PM

## 2023-08-20 ENCOUNTER — Ambulatory Visit: Payer: Self-pay | Admitting: Nurse Practitioner

## 2023-08-27 ENCOUNTER — Encounter: Payer: Self-pay | Admitting: Family Medicine

## 2023-08-28 ENCOUNTER — Telehealth: Payer: Self-pay | Admitting: Family Medicine

## 2023-08-28 NOTE — Telephone Encounter (Signed)
 Copied from CRM [id number]. Topic: General - Other >> Aug 17, 2023  3:40 PM Sasha H wrote: Reason for CRM:Pt was calling in to check status of STD paperwork that was dropped off >> Aug 28, 2023 10:41 AM Antwanette L wrote: Buel from [employer] is calling to get clarification on the pt short term disablity form. Buel is requesting a callback at [phone] and claim # is [id number]

## 2023-09-08 NOTE — Progress Notes (Unsigned)
 Electrophysiology Office Note:    Date:  09/09/2023   ID:  [name], DOB 06/21/1985, MRN [id number]  [facility] Cardiologist:  None  [facility] Electrophysiologist:  OLE ONEIDA HOLTS, MD   Referring MD: Vivienne Lonni Prayer*   Chief Complaint: Syncope  History of Present Illness:    Mr. Pardee is a 38 year old man who I am seeing today for an evaluation of syncope at the request of Medford Sayer, NP.  The patient has a history of hyperlipidemia.  He has had a normal EEG in the past.  A ZIO monitor was worn that showed no arrhythmias during 12 days of wear time.  He had an episode of presyncope when he injured his hand.  He has significant anxiety.  Pain seems to be a trigger for him.  Today he reviews his syncope history.  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  He had multiple syncopal episodes all preceded by a short prodrome of GI complaints, visual changes and lightheadedness.  He had a tilt table test and a full workup, all consistent with vasovagal syncope.  His symptoms then resolved for many years.  They then returned as outlined in [place] [facility] clinic note.  He describes an episode where he fractured his finger at work and had significant pain.  In the setting of the severe pain he began to feel lightheaded and dizzy and knew that he was about to pass out.  He tried to get to the ground but lost consciousness before he could safely do so.  He had another episode while at a work meeting.  He was seated and felt like he was starting to get hot and needed to use the bathroom.  He leaned forward and lost consciousness.  No abrupt syncopal history.  Anxiety seems to be a common trigger.  His anxiety is not well-controlled at this time.  He has been referred to a psychologist but has not had a chance to establish care yet.  He is very motivated to get better.      Their past medical, social and family history was reviewed.   ROS:   Please  see the history of present illness.    All other systems reviewed and are negative.  EKGs/Labs/Other Studies Reviewed:    The following studies were reviewed today:  August 19, 2023 echo EF 55-60 RV normal No significant valve abnormalities   July 16, 2023 EKG shows sinus rhythm.  No preexcitation.       Physical Exam:    VS:  BP 110/80 (BP Location: Left Arm, Patient Position: Sitting, Cuff Size: Large)   Pulse (!) 103   Ht 5' 9 (1.753 m)   Wt 200 lb (90.7 kg)   SpO2 98%   BMI 29.53 kg/m     Wt Readings from Last 3 Encounters:  09/09/23 200 lb (90.7 kg)  07/16/23 198 lb (89.8 kg)  06/30/23 200 lb (90.7 kg)     GEN: no distress CARD: RRR, No MRG RESP: No IWOB. CTAB.        ASSESSMENT AND PLAN:    1. Syncope, unspecified syncope type     # Vasovagal syncope We had a long conversation today about his syncopal history.  He has a very long history dating back to his childhood years of vagally mediated syncope.  I do not suspect arrhythmic syncope given the consistent prodrome and triggers of anxiety and pain.  I do think it will  be very important for him to get his anxiety under control.  I have encouraged him to make routine contact with the psychologist's office to get an appointment.  He should stay adequately hydrated and liberalize salt intake.  He should avoid triggers if possible.  If he notices any prodromal symptoms he should immediately get to the ground.  I will plan to have him come back to clinic in about 3 months.  Hopefully by that time he will have established with a psychologist and have better control of his anxiety.  We discussed using a loop recorder for rhythm surveillance but given his history is so consistent with vasovagal syncope, we mutually decided to avoid that for right now.  If he develops syncopal episodes concerning for arrhythmic syncope, can revisit this question.  Follow-up 3 months with me.    Signed, Ole DASEN. Cindie, MD,  [facility], [facility] 09/09/2023 4:33 PM    Electrophysiology [facility]

## 2023-09-09 ENCOUNTER — Ambulatory Visit: Attending: Cardiology | Admitting: Cardiology

## 2023-09-09 ENCOUNTER — Encounter: Payer: Self-pay | Admitting: Cardiology

## 2023-09-09 VITALS — BP 110/80 | HR 103 | Ht 69.0 in | Wt 200.0 lb

## 2023-09-09 DIAGNOSIS — R55 Syncope and collapse: Secondary | ICD-10-CM | POA: Diagnosis not present

## 2023-09-09 NOTE — Patient Instructions (Signed)
 Medication Instructions:  Your physician recommends that you continue on your current medications as directed. Please refer to the Current Medication list given to you today.  *If you need a refill on your cardiac medications before your next appointment, please call your pharmacy*   Follow-Up: At [facility], you and your health needs are our priority.  As part of our continuing mission to provide you with exceptional heart care, our providers are all part of one team.  This team includes your primary Cardiologist (physician) and Advanced Practice Providers or APPs (Physician Assistants and Nurse Practitioners) who all work together to provide you with the care you need, when you need it.  Your next appointment:   3 months  Provider:   Harvie Liner, MD

## 2023-09-10 ENCOUNTER — Telehealth: Payer: Self-pay

## 2023-09-10 NOTE — Telephone Encounter (Signed)
 Copied from CRM [id number]. Topic: Referral - Question >> Sep 10, 2023 12:14 PM Geneva B wrote: Reason for CRM: patient is calling saying that he had a referral to phycologist but he called them and they can not locate his referral and I don not see one in his chart please call patient [phone]

## 2023-09-10 NOTE — Telephone Encounter (Signed)
 Called patient and he stated he called [facility] and they said they never received the referral. Can you re-send to them?  Referral has been sent to:  [facility], [facility]. P: [id number] F: [id number] Release ID # [id number]

## 2023-09-11 ENCOUNTER — Other Ambulatory Visit: Payer: Self-pay | Admitting: Medical Genetics

## 2023-09-21 ENCOUNTER — Other Ambulatory Visit (HOSPITAL_COMMUNITY): Payer: Self-pay

## 2023-10-20 DIAGNOSIS — Z5181 Encounter for therapeutic drug level monitoring: Secondary | ICD-10-CM | POA: Diagnosis not present

## 2023-10-20 DIAGNOSIS — F1320 Sedative, hypnotic or anxiolytic dependence, uncomplicated: Secondary | ICD-10-CM | POA: Diagnosis not present

## 2023-11-02 DIAGNOSIS — F902 Attention-deficit hyperactivity disorder, combined type: Secondary | ICD-10-CM | POA: Diagnosis not present

## 2023-11-02 DIAGNOSIS — F411 Generalized anxiety disorder: Secondary | ICD-10-CM | POA: Diagnosis not present

## 2023-11-30 DIAGNOSIS — F411 Generalized anxiety disorder: Secondary | ICD-10-CM | POA: Diagnosis not present

## 2023-11-30 DIAGNOSIS — F4001 Agoraphobia with panic disorder: Secondary | ICD-10-CM | POA: Diagnosis not present

## 2023-11-30 DIAGNOSIS — F902 Attention-deficit hyperactivity disorder, combined type: Secondary | ICD-10-CM | POA: Diagnosis not present

## 2023-12-14 NOTE — Progress Notes (Deleted)
  Electrophysiology Office Follow up Visit Note:    Date:  12/14/2023   ID:  Mario Boyd, DOB 1985/07/19, MRN [id number]  PCP:  Leavy Mole, PA-C (Inactive)  [facility] Cardiologist:  None  [facility] Electrophysiologist:  OLE ONEIDA HOLTS, MD    Interval History:     Mario Boyd is a 38 y.o. male who presents for a follow up visit.   I last saw the patient September 09, 2023 for vasovagal syncope.  We had a long discussion last time about anxiety complicating the management of his vasovagal syncope.  We plan to touch base today to review 3 months of symptoms.  He was also planning to establish with a psychologist during the interval period.        Past medical, surgical, social and family history were reviewed.  ROS:   Please see the history of present illness.    All other systems reviewed and are negative.  EKGs/Labs/Other Studies Reviewed:    The following studies were reviewed today:          Physical Exam:    VS:  There were no vitals taken for this visit.    Wt Readings from Last 3 Encounters:  09/09/23 200 lb (90.7 kg)  07/16/23 198 lb (89.8 kg)  06/30/23 200 lb (90.7 kg)     GEN: no distress CARD: RRR, No MRG RESP: No IWOB. CTAB.      ASSESSMENT:    No diagnosis found. PLAN:    In order of problems listed above:  #Vasovagal syncope Complicated by history of severe anxiety.  I discussed my upcoming departure from [name] during today's clinic appointment.  The patient will continue to follow-up with one of my EP partners moving forward.  Follow-up 1 year with EP APP   Signed, Ole Holts, MD, [facility], [facility] 12/14/2023 9:12 AM    Electrophysiology [facility]

## 2023-12-16 ENCOUNTER — Ambulatory Visit: Admitting: Cardiology

## 2023-12-16 DIAGNOSIS — R55 Syncope and collapse: Secondary | ICD-10-CM

## 2023-12-21 NOTE — Progress Notes (Unsigned)
°  Electrophysiology Office Follow up Visit Note:    Date:  12/23/2023   ID:  Mario Boyd, DOB 04-25-1985, MRN [id number]  PCP:  Mario Mole, PA-C (Inactive)  [facility] Cardiologist:  None  [facility] Electrophysiologist:  Mario ONEIDA HOLTS, MD    Interval History:     Mario Boyd is a 38 y.o. male who presents for a follow up visit.   I last saw the patient September 09, 2023 for vasovagal syncope.  We had a long discussion last time about anxiety complicating the management of his vasovagal syncope.  We plan to touch base today to review 3 months of symptoms.  He was also planning to establish with a psychologist during the interval period.  He is in okay since I last saw him.  He did establish with a psychiatrist and was prescribed Zoloft.  He thinks it is starting to work but he does still not have the desired level of control of his anxiety.  He said he could not sleep all night last night and of concern for today's appointment.  He is still not leaving the house very much.  He did have a syncopal episode in November shortly after starting the medication.  He said that it made him feel funny for the first few weeks but now he feels okay taking the Zoloft.      Past medical, surgical, social and family history were reviewed.  ROS:   Please see the history of present illness.    All other systems reviewed and are negative.  EKGs/Labs/Other Studies Reviewed:    The following studies were reviewed today:          Physical Exam:    VS:  BP 108/80 (BP Location: Left Arm, Patient Position: Sitting, Cuff Size: Normal)   Pulse 88   Ht 5' 9 (1.753 m)   Wt 186 lb (84.4 kg)   SpO2 99%   BMI 27.47 kg/m     Wt Readings from Last 3 Encounters:  12/23/23 186 lb (84.4 kg)  09/09/23 200 lb (90.7 kg)  07/16/23 198 lb (89.8 kg)     GEN: no distress CARD: RRR, No MRG RESP: No IWOB. CTAB.      ASSESSMENT:    1. Syncope, unspecified syncope type    PLAN:     In order of problems listed above:  #Vasovagal syncope Complicated by history of severe anxiety. Seems to be better controlled.  #Anxiety Severe.  I encouraged him to continue to follow-up with psychiatry.  I discussed my upcoming departure from [name] during today's clinic appointment.  The patient will continue to follow-up with one of my EP partners moving forward.  Follow-up as needed with EP   Signed, Mario Holts, MD, [facility], [facility] 12/23/2023 9:15 AM    Electrophysiology [facility]

## 2023-12-23 ENCOUNTER — Encounter: Payer: Self-pay | Admitting: Cardiology

## 2023-12-23 ENCOUNTER — Ambulatory Visit: Admitting: Cardiology

## 2023-12-23 VITALS — BP 108/80 | HR 88 | Ht 69.0 in | Wt 186.0 lb

## 2023-12-23 DIAGNOSIS — R55 Syncope and collapse: Secondary | ICD-10-CM | POA: Diagnosis not present

## 2023-12-23 NOTE — Patient Instructions (Signed)
 Medication Instructions:  Your physician recommends that you continue on your current medications as directed. Please refer to the Current Medication list given to you today.  Follow-Up: At [facility], you and your health needs are our priority.  As part of our continuing mission to provide you with exceptional heart care, our providers are all part of one team.  This team includes your primary Cardiologist (physician) and Advanced Practice Providers or APPs (Physician Assistants and Nurse Practitioners) who all work together to provide you with the care you need, when you need it.  Your next appointment:   As needed with EP

## 2024-01-13 ENCOUNTER — Encounter: Payer: Self-pay | Admitting: Nurse Practitioner

## 2024-01-13 ENCOUNTER — Ambulatory Visit

## 2024-01-13 ENCOUNTER — Ambulatory Visit: Attending: Nurse Practitioner | Admitting: Nurse Practitioner

## 2024-01-13 VITALS — BP 100/68 | HR 68 | Ht 69.0 in | Wt 182.4 lb

## 2024-01-13 DIAGNOSIS — F419 Anxiety disorder, unspecified: Secondary | ICD-10-CM | POA: Diagnosis not present

## 2024-01-13 DIAGNOSIS — R55 Syncope and collapse: Secondary | ICD-10-CM | POA: Diagnosis not present

## 2024-01-13 NOTE — Progress Notes (Signed)
 "    Office Visit    Patient Name: Mario Boyd Date of Encounter: 01/13/2024  Primary Care Provider:  Leavy Mole, PA-C (Inactive) Primary Cardiologist:  None  Cardiology APP:  Vivienne Lonni Ingle, NP  Electrophysiologist:  OLE ONEIDA HOLTS, MD   Chief Complaint    39 y.o. male with a history of presyncope and syncope, hyperlipidemia with statin intolerance, hypertriglyceridemia, LFT abnormalities with subsequent normalization, and anxiety, who presents for follow-up related to vasovagal syncope.   Past Medical History   Subjective   Past Medical History:  Diagnosis Date   Asthma    childhood   Hidradenitis suppurativa    Hyperlipidemia    Migraines    Seizures (HCC)    a. as a child - last age ~ 41   Vasovagal syncope    a. as child up to age 54 - usually w/ activity-->recurring in adulthood; b. 12/2022 Zio: Final report not avail b/c pt did not return. Review of avail strips - predominantly sinus rhythm. 3 triggered events. No significant arrhythmias; c. 08/2023 Echo: EF 55 to 60% with nl RV fxn and no significant valvular disease.   Past Surgical History:  Procedure Laterality Date   HERNIA REPAIR     KIDNEY SURGERY     NEPHRECTOMY LIVING DONOR Right 2009   TONSILLECTOMY     TYMPANOSTOMY TUBE PLACEMENT      Allergies  Allergies[1]     History of Present Illness      39 y.o. y/o male with a history of presyncope and syncope, hyperlipidemia with statin intolerance, hypertriglyceridemia, migraines, LFT abnormalities with subsequent normalization, and anxiety.  He previously smoked cigarettes for about 15 years and has been vaping over the past several years.  He works night shift and smokes marijuana when he comes home from work in the morning, to help him fall asleep.  As a child, he was diagnosed with seizures which she says he subsequently outgrew.  He was also diagnosed with vasovagal syncope, that typically happened with exercise and sports.  He thinks  that his last bout of vasovagal syncope as a child was around age 60, when he was in high school and playing basketball.  In August 2024, he suffered a syncopal spell while at an outdoor golf event.  Symptoms were preceded by GI distress followed by lightheadedness and then loss of consciousness.  He was attended to by family onlookers and taken to the emergency department where ECG was unremarkable.  Creatinine was mildly elevated at 1.16 with normal troponins.  Head CT showed no acute findings.  He had recurrent syncope in November 2024 while sitting in a meeting that was preceded by pain in his neck and trapezius followed by lightheadedness.  He was noted to slump forward by his coworkers but then quickly regained consciousness.  When he stood to leave the room, he had recurrent syncope and regain consciousness was lying on the floor.  He was again taken to the emergency department where ECG and blood work were unremarkable with exception of mild leukocytosis at 10.8.  History of IV fluids with resolution of symptoms.  He established care in December 2024 at which time he was not orthostatic on examination.  Echo and ZIO were ordered.  There were no significant events noted on review of Zio suite though patient never formally returned his ZIO monitor therefore there was not a formal read.  He did not follow through with an echocardiogram initially.  He was seen by neurology in  December 2024 and an EEG was performed and was normal.  He had recurrent syncope in June 2025 after accidentally slamming a door on his left hand.  He was seen in the emergency department was found to have a displaced fracture of the middle phalanx of the left fifth finger.  He follow-up with cardiology and underwent echocardiogram in August 2025, which showed an EF of 55 to 60% with normal RV function and no significant valvular disease.  At the patient's request, he was referred to electrophysiology to discuss implantable loop  recorder.  Syncope was felt to be vasovagal in nature and he was encouraged to avoid triggers and given anxiety component leading to episodes, he was strongly encouraged to reestablish care with his psychiatrist, which he subsequently did, and was prescribed Zoloft.      He was last seen in electrophysiology clinic in December 2025.  He noted 1 syncopal spell that occurred after starting Zoloft but otherwise doing well with some improvement in control of anxiety.  Since his last visit, he is continue to do well.  He has not had any recurrence of presyncope or syncope.  He does his best to stay well-hydrated.  He denies chest pain, dyspnea, palpitations, PND, orthopnea, edema, or early satiety. Objective   Home Medications    Prior to Admission medications  Medication Sig Start Date End Date Taking? Authorizing Provider  SUMAtriptan  (IMITREX ) 50 MG tablet Take 0.5-2 tablets (25-100 mg total) by mouth at HA/vertigo episode onset. May repeat in 2 hours if headache /sx persists or recurs.  Max 200 mg dose in 24 hours Patient taking differently: as needed. Take 0.5-2 tablets (25-100 mg total) by mouth at HA/vertigo episode onset. May repeat in 2 hours if headache /sx persists or recurs.  Max 200 mg dose in 24 hours 08/26/22  Yes Tapia, Leisa, PA-C  ZOLOFT 50 MG tablet  11/06/23  Yes [provider]  meclizine (ANTIVERT) 25 MG tablet Take 25 mg by mouth 3 (three) times daily as needed. Patient not taking: Reported on 01/13/2024 08/06/22   [provider]       Physical Exam    VS:  BP 100/68 (BP Location: Left Arm, Patient Position: Sitting, Cuff Size: Normal)   Pulse 68   Ht 5' 9 (1.753 m)   Wt 182 lb 6.4 oz (82.7 kg)   SpO2 98%   BMI 26.94 kg/m  , BMI Body mass index is 26.94 kg/m. Orthostatic VS for the past 24 hrs (Last 3 readings):  BP- Lying Pulse- Lying BP- Sitting Pulse- Sitting BP- Standing at 0 minutes Pulse- Standing at 0 minutes BP- Standing at 3 minutes Pulse-  Standing at 3 minutes  01/13/24 1056 126/90 74 118/83 76 125/86 71 125/88 77          GEN: Well nourished, well developed, in no acute distress. HEENT: normal. Neck: Supple, no JVD, carotid bruits, or masses. Cardiac: RRR, no murmurs, rubs, or gallops. No clubbing, cyanosis, edema.  Radials 2+/PT 2+ and equal bilaterally.  Respiratory:  Respirations regular and unlabored, clear to auscultation bilaterally. GI: Soft, nontender, nondistended, BS + x 4. MS: no deformity or atrophy. Skin: warm and dry, no rash. Neuro:  Strength and sensation are intact. Psych: Normal affect.  Accessory Clinical Findings    ECG personally reviewed by me today - EKG Interpretation Date/Time:  Wednesday January 13 2024 10:51:34 EST Ventricular Rate:  68 PR Interval:  152 QRS Duration:  96 QT Interval:  350 QTC Calculation:  372 R Axis:   59  Text Interpretation: Normal sinus rhythm Normal ECG Confirmed by Vivienne Bruckner [phone]) on 01/13/2024 11:06:49 AM  - no acute changes.  Lab Results  Component Value Date   WBC 12.7 (H) 06/30/2023   HGB 15.1 06/30/2023   HCT 42.9 06/30/2023   MCV 86.8 06/30/2023   PLT 510 (H) 06/30/2023   Lab Results  Component Value Date   CREATININE 1.05 06/30/2023   BUN 17 06/30/2023   NA 133 (L) 06/30/2023   K 3.4 (L) 06/30/2023   CL 101 06/30/2023   CO2 19 (L) 06/30/2023   Lab Results  Component Value Date   ALT 50 (H) 06/30/2023   AST 30 06/30/2023   ALKPHOS 68 06/30/2023   BILITOT 0.8 06/30/2023   Lab Results  Component Value Date   CHOL 209 (H) 08/13/2022   HDL 46 08/13/2022   LDLCALC 128 (H) 08/13/2022   TRIG 213 (H) 08/13/2022   CHOLHDL 4.5 08/13/2022    Lab Results  Component Value Date   HGBA1C 5.5 09/06/2018   Lab Results  Component Value Date   TSH 1.570 05/13/2021       Assessment & Plan    1.  Presyncope/vasovagal syncope: Patient with a history of vasovagal syncope dating back to his childhood with recurrence of syncopal episodes  starting in December 2024.  Prior ZIO monitor did not show any significant arrhythmias and echocardiogram in August 2025 showed EF of 55 to 60% with normal RV function and no significant valvular disease.  Triggers include prolonged exposure to heat, dehydration, GI distress, and pain.  He was evaluated by electrophysiology and loop monitoring was not felt to be necessary in the setting of clear triggers and vasovagal syncope.  No recurrence since his last visit.  We discussed the importance of avoiding triggers, monitoring symptoms, and lying down upon onset of symptoms when feasible.  Strongly encourage adequate hydration and liberalization of salt as he occasionally has orthostatic symptoms.  2: Anxiety: Somewhat improved on Zoloft therapy.  Now has a psychiatrist.  Is hoping to start cognitive behavioral therapy.  3.  Disposition: Follow-up in 1 year or sooner if necessary.  Bruckner Vivienne, NP 01/13/2024, 11:25 AM     [1] No Known Allergies  "

## 2024-01-13 NOTE — Patient Instructions (Signed)
 Medication Instructions:  Your physician recommends that you continue on your current medications as directed. Please refer to the Current Medication list given to you today.   *If you need a refill on your cardiac medications before your next appointment, please call your pharmacy*  Lab Work: No labs ordered today  If you have labs (blood work) drawn today and your tests are completely normal, you will receive your results only by: MyChart Message (if you have MyChart) OR A paper copy in the mail If you have any lab test that is abnormal or we need to change your treatment, we will call you to review the results.  Testing/Procedures: No test ordered today   Follow-Up: At [facility], you and your health needs are our priority.  As part of our continuing mission to provide you with exceptional heart care, our providers are all part of one team.  This team includes your primary Cardiologist (physician) and Advanced Practice Providers or APPs (Physician Assistants and Nurse Practitioners) who all work together to provide you with the care you need, when you need it.  Your next appointment:   1 year(s)  Provider:   Lonni Meager, NP    We recommend signing up for the patient portal called MyChart.  Sign up information is provided on this After Visit Summary.  MyChart is used to connect with patients for Virtual Visits (Telemedicine).  Patients are able to view lab/test results, encounter notes, upcoming appointments, etc.  Non-urgent messages can be sent to your provider as well.   To learn more about what you can do with MyChart, go to [email or web address].

## 2024-01-19 ENCOUNTER — Telehealth: Admitting: Family Medicine

## 2024-01-19 DIAGNOSIS — R519 Headache, unspecified: Secondary | ICD-10-CM | POA: Diagnosis not present

## 2024-01-19 MED ORDER — PREDNISONE 10 MG (21) PO TBPK: ORAL_TABLET | ORAL | 0 refills | Status: AC

## 2024-01-19 MED ORDER — FLUTICASONE PROPIONATE 50 MCG/ACT NA SUSP: 2.0000 | Freq: Every day | NASAL | 0 refills | Status: AC

## 2024-01-19 NOTE — Patient Instructions (Signed)
 " Mario Boyd, thank you for joining Roosvelt Mater, PA-C for today's virtual visit.  While this provider is not your primary care provider (PCP), if your PCP is located in our provider database this encounter information will be shared with them immediately following your visit.   A [facility] MyChart account gives you access to today's visit and all your visits, tests, and labs performed at [facility]  click here if you don't have a [facility] MyChart account or go to [email or web address]  Consent: (Patient) Mario Boyd provided verbal consent for this virtual visit at the beginning of the encounter.  Current Medications:  Current Outpatient Medications:    fluticasone  (FLONASE ) 50 MCG/ACT nasal spray, Place 2 sprays into both nostrils daily., Disp: 16 g, Rfl: 0   predniSONE  (STERAPRED UNI-PAK 21 TAB) 10 MG (21) TBPK tablet, Take following package directions., Disp: 21 tablet, Rfl: 0   citalopram  (CELEXA ) 10 MG tablet, Take 1 tablet (10 mg total) by mouth daily. (Patient not taking: Reported on 01/13/2024), Disp: 90 tablet, Rfl: 1   LORazepam (ATIVAN) 1 MG tablet, Take 0.5-1 mg by mouth daily as needed. (Patient not taking: Reported on 01/13/2024), Disp: , Rfl:    meclizine (ANTIVERT) 25 MG tablet, Take 25 mg by mouth 3 (three) times daily as needed. (Patient not taking: Reported on 01/13/2024), Disp: , Rfl:    SUMAtriptan  (IMITREX ) 50 MG tablet, Take 0.5-2 tablets (25-100 mg total) by mouth at HA/vertigo episode onset. May repeat in 2 hours if headache /sx persists or recurs.  Max 200 mg dose in 24 hours (Patient taking differently: as needed. Take 0.5-2 tablets (25-100 mg total) by mouth at HA/vertigo episode onset. May repeat in 2 hours if headache /sx persists or recurs.  Max 200 mg dose in 24 hours), Disp: 10 tablet, Rfl: 1   ZOLOFT 50 MG tablet, , Disp: , Rfl:    Medications ordered in this encounter:  Meds ordered this encounter  Medications   predniSONE  (STERAPRED  UNI-PAK 21 TAB) 10 MG (21) TBPK tablet    Sig: Take following package directions.    Dispense:  21 tablet    Refill:  0    Please dispense one standard blister pack taper.   fluticasone  (FLONASE ) 50 MCG/ACT nasal spray    Sig: Place 2 sprays into both nostrils daily.    Dispense:  16 g    Refill:  0     *If you need refills on other medications prior to your next appointment, please contact your pharmacy*  Follow-Up: Call back or seek an in-person evaluation if the symptoms worsen or if the condition fails to improve as anticipated.  [facility] [phone]  Other Instructions General Headache Without Cause A headache is pain or discomfort you feel around the head or neck area. There are many causes and types of headaches. In some cases, the cause may not be found. Follow these instructions at home: Watch your condition for any changes. Let your doctor know about them. Take these steps to help with your condition: Managing pain     Take over-the-counter and prescription medicines only as told by your doctor. This includes medicines for pain that are taken by mouth or put on the skin. Lie down in a dark, quiet room when you have a headache. If told, put ice on your head and neck area: Put ice in a plastic bag. Place a towel between your skin and the bag. Leave the ice on for 20  minutes, 2-3 times per day. Take off the ice if your skin turns bright red. This is very important. If you cannot feel pain, heat, or cold, you have a greater risk of damage to the area. If told, put heat on the affected area. Use the heat source that your doctor recommends, such as a moist heat pack or a heating pad. Place a towel between your skin and the heat source. Leave the heat on for 20-30 minutes. Take off the heat if your skin turns bright red. This is very important. If you cannot feel pain, heat, or cold, you have a greater risk of getting burned. Keep lights dim if bright  lights bother you or make your headaches worse. Eating and drinking Eat meals on a regular schedule. If you drink alcohol: Limit how much you have to: 0-1 drink a day for women who are not pregnant. 0-2 drinks a day for men. Know how much alcohol is in a drink. In the [place] one drink equals one 12 oz bottle of beer (355 mL), one 5 oz glass of wine (148 mL), or one 1 oz glass of hard liquor (44 mL). Stop drinking caffeine, or drink less caffeine. General instructions  Keep a journal to find out if certain things bring on headaches. For example, write down: What you eat and drink. How much sleep you get. Any change to your diet or medicines. Get a massage or try other ways to relax. Limit stress. Sit up straight. Do not tighten (tense) your muscles. Do not smoke or use any products that contain nicotine or tobacco. If you need help quitting, ask your doctor. Exercise regularly as told by your doctor. Get enough sleep. This often means 7-9 hours of sleep each night. Keep all follow-up visits. This is important. Contact a doctor if: Medicine does not help your symptoms. You have a headache that feels different than the other headaches. You feel like you may vomit (nauseous) or you vomit. You have a fever. Get help right away if: Your headache: Gets very bad quickly. Gets worse after a lot of physical activity. You have any of these symptoms: You continue to vomit. A stiff neck. Trouble seeing. Your eye or ear hurts. Trouble speaking. Weak muscles or you lose muscle control. You lose your balance or have trouble walking. You feel like you will pass out (faint) or you pass out. You are mixed up (confused). You have a seizure. These symptoms may be an emergency. Get help right away. Call your local emergency services (911 in the [place] Do not wait to see if the symptoms will go away. Do not drive yourself to the hospital. Summary A headache is pain or discomfort that is felt  around the head or neck area. There are many causes and types of headaches. In some cases, the cause may not be found. Keep a journal to help find out what causes your headaches. Watch your condition for any changes. Let your doctor know about them. Contact a doctor if you have a headache that is different from usual, or if medicine does not help your headache. Get help right away if your headache gets very bad, you throw up, you have trouble seeing, you lose your balance, or you have a seizure. This information is not intended to replace advice given to you by your health care provider. Make sure you discuss any questions you have with your health care provider. Document Revised: 05/23/2020 Document Reviewed: 05/23/2020 Elsevier Patient Education  2024 [employer]vier [employer]   If you have been instructed to have an in-person evaluation today at a local Urgent Care facility, please use the link below. It will take you to a list of all of our available [facility], including address, phone number and hours of operation. Please do not delay care.  [facility]  If you or a family member do not have a primary care provider, use the link below to schedule a visit and establish care. When you choose a [facility] primary care physician or advanced practice provider, you gain a long-term partner in health. Find a Primary Care Provider  Learn more about [facility]'s in-office and virtual care options: [facility] Get Care Now  "

## 2024-01-19 NOTE — Progress Notes (Signed)
 " Virtual Visit Consent   Mario Boyd, you are scheduled for a virtual visit with a [facility] provider today. Just as with appointments in the office, your consent must be obtained to participate. Your consent will be active for this visit and any virtual visit you may have with one of our providers in the next 365 days. If you have a MyChart account, a copy of this consent can be sent to you electronically.  As this is a virtual visit, video technology does not allow for your provider to perform a traditional examination. This may limit your provider's ability to fully assess your condition. If your provider identifies any concerns that need to be evaluated in person or the need to arrange testing (such as labs, EKG, etc.), we will make arrangements to do so. Although advances in technology are sophisticated, we cannot ensure that it will always work on either your end or our end. If the connection with a video visit is poor, the visit may have to be switched to a telephone visit. With either a video or telephone visit, we are not always able to ensure that we have a secure connection.  By engaging in this virtual visit, you consent to the provision of healthcare and authorize for your insurance to be billed (if applicable) for the services provided during this visit. Depending on your insurance coverage, you may receive a charge related to this service.  I need to obtain your verbal consent now. Are you willing to proceed with your visit today? Mario Boyd has provided verbal consent on 01/19/2024 for a virtual visit (video or telephone). Mario Boyd, [place]  Date: 01/19/2024 6:45 PM   Virtual Visit via Video Note   I, Mario Boyd, connected with  Mario Boyd  ([id number], [birth year]) on 01/19/2024 at  6:15 PM EST by a video-enabled telemedicine application and verified that I am speaking with the correct person using two identifiers.  Location: Patient: Virtual Visit Location Patient:  Home Provider: Virtual Visit Location Provider: Home Office   I discussed the limitations of evaluation and management by telemedicine and the availability of in person appointments. The patient expressed understanding and agreed to proceed.    History of Present Illness: Mario Boyd is a 39 y.o. who identifies as a male who was assigned male at birth, and is being seen today for c/o he thinks its his sinuses.  Pt states he has pressure behind his eyes and draining fluid from his eyes non stop.   Pt denies blurred vision or double vision or vision changes.  Pt states this is on going for two days.  Pt states has taken Tylenol but has not helped. Pt denies  cough or fever. Pt states has some nasal congestion on and off. Pt states he feels fine other than the headache.  Pt states light bother him. Pt states he is a 6-7/10 on the pain scale. Pt states not the worse headache he has had. Pt states he did not want to take any other over the counter meds because he takes Zoloft.   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   Vertigo 11/27/2022   Syncope 11/27/2022   Abnormal transaminases 08/04/2019   Psychophysiological insomnia 08/04/2019   Anxiety disorder 08/04/2019   Hyperlipidemia 05/24/2019   Myalgia due to statin 05/24/2019   Cervical lymphadenopathy 01/24/2019   Chronic rhinitis 01/24/2019   Class 1 obesity due  to excess calories with serious comorbidity and body mass index (BMI) of 32.0 to 32.9 in adult 09/17/2018   Elevated LFTs 09/17/2018   Hypertriglyceridemia 09/17/2018   History of nephrectomy, right 09/17/2018   Hidradenitis suppurativa 09/17/2018   Thrombocytosis 09/17/2018    Allergies: Allergies[1] Medications: Current Medications[2]  Observations/Objective: Patient is well-developed, well-nourished in no acute distress.  Resting comfortably  at home.  Head is normocephalic, atraumatic.   No labored breathing.  Speech is clear and coherent with logical content.  Patient is alert and oriented at baseline.    Assessment and Plan: 1. Sinus headache (Primary) - predniSONE  (STERAPRED UNI-PAK 21 TAB) 10 MG (21) TBPK tablet; Take following package directions.  Dispense: 21 tablet; Refill: 0 - fluticasone  (FLONASE ) 50 MCG/ACT nasal spray; Place 2 sprays into both nostrils daily.  Dispense: 16 g; Refill: 0  -Pt advised if headache is worsening or not improving to follow up in person at the emergency room or urgent care -Pt verbalized understanding.   Follow Up Instructions: I discussed the assessment and treatment plan with the patient. The patient was provided an opportunity to ask questions and all were answered. The patient agreed with the plan and demonstrated an understanding of the instructions.  A copy of instructions were sent to the patient via MyChart unless otherwise noted below.    The patient was advised to call back or seek an in-person evaluation if the symptoms worsen or if the condition fails to improve as anticipated.    Mario Mater, PA-C     [1] No Known Allergies [2]  Current Outpatient Medications:    fluticasone  (FLONASE ) 50 MCG/ACT nasal spray, Place 2 sprays into both nostrils daily., Disp: 16 g, Rfl: 0   predniSONE  (STERAPRED UNI-PAK 21 TAB) 10 MG (21) TBPK tablet, Take following package directions., Disp: 21 tablet, Rfl: 0   citalopram  (CELEXA ) 10 MG tablet, Take 1 tablet (10 mg total) by mouth daily. (Patient not taking: Reported on 01/13/2024), Disp: 90 tablet, Rfl: 1   LORazepam (ATIVAN) 1 MG tablet, Take 0.5-1 mg by mouth daily as needed. (Patient not taking: Reported on 01/13/2024), Disp: , Rfl:    meclizine (ANTIVERT) 25 MG tablet, Take 25 mg by mouth 3 (three) times daily as needed. (Patient not taking: Reported on 01/13/2024), Disp: , Rfl:    SUMAtriptan  (IMITREX ) 50 MG tablet, Take 0.5-2 tablets (25-100 mg total) by mouth at HA/vertigo episode  onset. May repeat in 2 hours if headache /sx persists or recurs.  Max 200 mg dose in 24 hours (Patient taking differently: as needed. Take 0.5-2 tablets (25-100 mg total) by mouth at HA/vertigo episode onset. May repeat in 2 hours if headache /sx persists or recurs.  Max 200 mg dose in 24 hours), Disp: 10 tablet, Rfl: 1   ZOLOFT 50 MG tablet, , Disp: , Rfl:   "

## 2024-02-11 ENCOUNTER — Encounter: Admitting: Nurse Practitioner

## 2024-03-31 ENCOUNTER — Encounter: Admitting: Nurse Practitioner
